# Patient Record
Sex: Male | Born: 1985
Health system: Southern US, Community
[De-identification: ages and names within clinical notes are randomized; demographics above are authoritative.]

## PROBLEM LIST (undated history)

## (undated) DIAGNOSIS — S62509A Fracture of unspecified phalanx of unspecified thumb, initial encounter for closed fracture: Secondary | ICD-10-CM

## (undated) DIAGNOSIS — R768 Other specified abnormal immunological findings in serum: Secondary | ICD-10-CM

## (undated) DIAGNOSIS — F319 Bipolar disorder, unspecified: Secondary | ICD-10-CM

## (undated) DIAGNOSIS — R7689 Other specified abnormal immunological findings in serum: Secondary | ICD-10-CM

## (undated) DIAGNOSIS — Z5900 Homelessness unspecified: Secondary | ICD-10-CM

## (undated) DIAGNOSIS — F1910 Other psychoactive substance abuse, uncomplicated: Secondary | ICD-10-CM

## (undated) DIAGNOSIS — Z22322 Carrier or suspected carrier of Methicillin resistant Staphylococcus aureus: Secondary | ICD-10-CM

## (undated) DIAGNOSIS — M479 Spondylosis, unspecified: Secondary | ICD-10-CM

## (undated) DIAGNOSIS — Z789 Other specified health status: Secondary | ICD-10-CM

## (undated) DIAGNOSIS — B1920 Unspecified viral hepatitis C without hepatic coma: Secondary | ICD-10-CM

## (undated) DIAGNOSIS — L309 Dermatitis, unspecified: Secondary | ICD-10-CM

## (undated) HISTORY — PX: OTHER SURGICAL HISTORY: SHX169

## (undated) HISTORY — PX: NO PAST SURGERIES: SHX2092

## (undated) HISTORY — DX: Other specified health status: Z78.9

## (undated) HISTORY — DX: Fracture of unspecified phalanx of unspecified thumb, initial encounter for closed fracture: S62.509A

## (undated) HISTORY — DX: Spondylosis, unspecified: M47.9

---

## 2011-10-15 ENCOUNTER — Encounter (HOSPITAL_COMMUNITY): Payer: Self-pay | Admitting: *Deleted

## 2011-10-15 ENCOUNTER — Emergency Department (HOSPITAL_COMMUNITY)
Admission: EM | Admit: 2011-10-15 | Discharge: 2011-10-15 | Disposition: A | Discharge status: Home / Self Care | Payer: Self-pay | Attending: Emergency Medicine | Admitting: Emergency Medicine

## 2011-10-15 DIAGNOSIS — F17200 Nicotine dependence, unspecified, uncomplicated: Secondary | ICD-10-CM | POA: Insufficient documentation

## 2011-10-15 DIAGNOSIS — L259 Unspecified contact dermatitis, unspecified cause: Secondary | ICD-10-CM | POA: Insufficient documentation

## 2011-10-15 DIAGNOSIS — L298 Other pruritus: Secondary | ICD-10-CM | POA: Insufficient documentation

## 2011-10-15 DIAGNOSIS — L0291 Cutaneous abscess, unspecified: Secondary | ICD-10-CM | POA: Insufficient documentation

## 2011-10-15 DIAGNOSIS — L2989 Other pruritus: Secondary | ICD-10-CM | POA: Insufficient documentation

## 2011-10-15 DIAGNOSIS — L0390 Cellulitis, unspecified: Secondary | ICD-10-CM

## 2011-10-15 DIAGNOSIS — R21 Rash and other nonspecific skin eruption: Secondary | ICD-10-CM | POA: Insufficient documentation

## 2011-10-15 MED ORDER — CEPHALEXIN 500 MG PO CAPS: 500.0000 mg | ORAL_CAPSULE | Freq: Four times a day (QID) | ORAL | Status: AC

## 2011-10-15 MED ORDER — SULFAMETHOXAZOLE-TMP DS 800-160 MG PO TABS: 2.0000 | ORAL_TABLET | Freq: Two times a day (BID) | ORAL | Status: AC

## 2011-10-15 MED ORDER — OXYCODONE-ACETAMINOPHEN 5-325 MG PO TABS: 2.0000 | ORAL_TABLET | Freq: Four times a day (QID) | ORAL | Status: AC | PRN

## 2011-10-15 MED ORDER — CEPHALEXIN 250 MG PO CAPS
500.0000 mg | ORAL_CAPSULE | Freq: Once | ORAL | Status: AC
  Administered 2011-10-15: 500 mg via ORAL
  Filled 2011-10-15: qty 2

## 2011-10-15 MED ORDER — SULFAMETHOXAZOLE-TMP DS 800-160 MG PO TABS
2.0000 | ORAL_TABLET | Freq: Once | ORAL | Status: AC
  Administered 2011-10-15: 2 via ORAL
  Filled 2011-10-15: qty 2

## 2011-10-15 MED ORDER — OXYCODONE-ACETAMINOPHEN 5-325 MG PO TABS
2.0000 | ORAL_TABLET | Freq: Once | ORAL | Status: AC
  Administered 2011-10-15: 2 via ORAL
  Filled 2011-10-15: qty 2

## 2011-10-15 NOTE — ED Provider Notes (Signed)
History     CSN: [id number]  Arrival date & time 10/15/11  1228   First MD Initiated Contact with Patient 10/15/11 1340      Chief Complaint  Patient presents with  . Rash    (Consider location/radiation/quality/duration/timing/severity/associated sxs/prior treatment) HPI Patient is a 26 year old male with history of eczema. He comes in today stating that his "eczema is acting up".  He shouldn't denies any fevers, nausea, or vomiting. He has not had any history of diabetes. He has not tried anything for this. He describes the pain that he has in his forearms and over his lower abdomen as an 8/10 and itching and burning. Patient seemed dynamically stable. He has no history of skin infections that he knows of. He does feel like this looks slightly different than his previous eczema. There is nothing that has made his symptoms better or worse.There are no other associated or modifying factors.  History reviewed. No pertinent past medical history.  History reviewed. No pertinent past surgical history.  History reviewed. No pertinent family history.  History  Substance Use Topics  . Smoking status: Current Everyday Smoker    Types: Cigarettes  . Smokeless tobacco: Not on file  . Alcohol Use: No      Review of Systems  Constitutional: Negative.   HENT: Negative.   Eyes: Negative.   Respiratory: Negative.   Cardiovascular: Negative.   Gastrointestinal: Negative.   Genitourinary: Negative.   Musculoskeletal: Negative.   Skin: Positive for pallor.  Neurological: Negative.   Hematological: Negative.   Psychiatric/Behavioral: Negative.   All other systems reviewed and are negative.    Allergies  Review of patient's allergies indicates no known allergies.  Home Medications   Current Outpatient Rx  Name Route Sig Dispense Refill  . DIPHENHYDRAMINE HCL 25 MG PO TABS Oral Take 25 mg by mouth every 6 (six) hours as needed. For itching      BP 154/85  Pulse 60  Temp(Src)  98.2 F (36.8 C) (Oral)  Resp 16  SpO2 99%  Physical Exam  Nursing note and vitals reviewed. GEN: Well-developed, well-nourished male in no distress HEENT: Atraumatic, normocephalic. Oropharynx clear without erythema EYES: PERRLA BL, no scleral icterus. NECK: Trachea midline, no meningismus CV: regular rate and rhythm. No murmurs, rubs, or gallops PULM: No respiratory distress.  No crackles, wheezes, or rales. GI: soft, non-tender. No guarding, rebound, or tenderness. + bowel sounds  Neuro: cranial nerves 2-12 intact, no abnormalities of strength or sensation, A and O x 3 MSK: Patient moves all 4 extremities symmetrically, no deformity, edema, or injury noted Psych: no abnormality of mood Skin: Patient with rash with appearance of eczema and raised plaques over the right forearm. There are open areas in this did appear to be from excoriation with surrounding erythema. Over the lower abdomen and also the left arm there are erythematous areas. These appear to be associated with hair follicles and consistent with a cellulitis beginning as a folliculitis the  ED Course  Procedures (including critical care time)  Labs Reviewed - No data to display No results found.   1. Cellulitis       MDM  Patient was evaluated by myself and was him dynamically stable. He is on any history of diabetes. Based on his symptoms he was treated with oral pain medication and also with Keflex and Bactrim. I prescribed him with 10 days of both of these antibiotics as well as 30 tablets of Percocet. He was told to use ice  to avoid scratching. Patient was comfortable with plan for discharge home and was discharged in good condition.        Cyndra Numbers, MD 10/15/11 [phone]

## 2011-10-15 NOTE — ED Notes (Signed)
Pt reprots having exzema x 10 years, has red itching rash to abd and bilateral arms.

## 2011-10-15 NOTE — ED Notes (Signed)
Pt gowned.  Dr. Alto Denver at pt bedside.

## 2011-10-15 NOTE — ED Notes (Signed)
Pt. Stated, I've had a rash for 8 years.

## 2012-04-04 ENCOUNTER — Emergency Department (HOSPITAL_COMMUNITY)
Admission: EM | Admit: 2012-04-04 | Discharge: 2012-04-04 | Disposition: A | Discharge status: Home / Self Care | Payer: Self-pay | Attending: Emergency Medicine | Admitting: Emergency Medicine

## 2012-04-04 ENCOUNTER — Encounter (HOSPITAL_COMMUNITY): Payer: Self-pay | Admitting: *Deleted

## 2012-04-04 DIAGNOSIS — F17200 Nicotine dependence, unspecified, uncomplicated: Secondary | ICD-10-CM | POA: Insufficient documentation

## 2012-04-04 DIAGNOSIS — IMO0002 Reserved for concepts with insufficient information to code with codable children: Secondary | ICD-10-CM

## 2012-04-04 DIAGNOSIS — S61209A Unspecified open wound of unspecified finger without damage to nail, initial encounter: Secondary | ICD-10-CM | POA: Insufficient documentation

## 2012-04-04 DIAGNOSIS — W268XXA Contact with other sharp object(s), not elsewhere classified, initial encounter: Secondary | ICD-10-CM | POA: Insufficient documentation

## 2012-04-04 MED ORDER — IBUPROFEN 400 MG PO TABS
800.0000 mg | ORAL_TABLET | Freq: Once | ORAL | Status: AC
  Administered 2012-04-04: 800 mg via ORAL
  Filled 2012-04-04: qty 2

## 2012-04-04 MED ORDER — AMOXICILLIN-POT CLAVULANATE 875-125 MG PO TABS: 1.0000 | ORAL_TABLET | Freq: Two times a day (BID) | ORAL | Status: AC

## 2012-04-04 NOTE — ED Notes (Signed)
Patient states recently received tetanus injection.  Wound cleaned with wound cleanser open to air for Doctor to examine pain 3-8//10 achy burning. Radial pulses +2 bilateral full sensation.

## 2012-04-04 NOTE — ED Provider Notes (Signed)
History    This chart was scribed for James Baker, MD, MD by Smitty Pluck. The patient was seen in room TR05C and the patient's care was started at 12:14PM.   CSN: [id number]  Arrival date & time 04/04/12  1152   None     Chief Complaint  Patient presents with  . Extremity Laceration    (Consider location/radiation/quality/duration/timing/severity/associated sxs/prior treatment) The history is provided by the patient.   Lynard Postlewait is a 26 y.o. male who presents to the Emergency Department complaining of moderate left thumb laceration onset 17 hours ago. Pt reports that he cut his thumb on scrap metal in the woods. Pt reports that he has cleaned the area with soap. No active bleeding.    No past medical history on file.  No past surgical history on file.  No family history on file.  History  Substance Use Topics  . Smoking status: Current Everyday Smoker    Types: Cigarettes  . Smokeless tobacco: Not on file  . Alcohol Use: No      Review of Systems  Constitutional: Negative for fever and chills.  Respiratory: Negative for shortness of breath.   Gastrointestinal: Negative for nausea and vomiting.  Neurological: Negative for weakness.  All other systems reviewed and are negative.    Allergies  Review of patient's allergies indicates no known allergies.  Home Medications   Current Outpatient Rx  Name Route Sig Dispense Refill  . DIPHENHYDRAMINE HCL 25 MG PO TABS Oral Take 25 mg by mouth every 6 (six) hours as needed. For itching      BP 155/90  Pulse 68  Temp 98 F (36.7 C) (Oral)  Resp 16  SpO2 99%  Physical Exam  Nursing note and vitals reviewed. Constitutional: He is oriented to person, place, and time. He appears well-developed and well-nourished. No distress.  HENT:  Head: Normocephalic and atraumatic.  Eyes: Conjunctivae are normal.  Neck: Normal range of motion. Neck supple.  Pulmonary/Chest: Effort normal. No respiratory distress.    Neurological: He is alert and oriented to person, place, and time.  Skin: Skin is warm and dry.       Base of left thumb palmar surface 1cm without active Bleeding slight erythema around the wound at base of left thumb Neurovascular intact at left thumb   Psychiatric: He has a normal mood and affect. His behavior is normal.    ED Course  Procedures (including critical care time) DIAGNOSTIC STUDIES: Oxygen Saturation is 99% on room air, normal by my interpretation.    COORDINATION OF CARE: 12:20PM EDP discusses pt ED treatment with pt     Labs Reviewed - No data to display No results found.   No diagnosis found.    MDM  Pt given abx and wound care instructions   I personally performed the services described in this documentation, which was scribed in my presence. The recorded information has been reviewed and considered.       James Baker, MD 04/04/12 1226

## 2012-04-04 NOTE — ED Notes (Signed)
Lt thumb laceration r/t getting scrap metal out of woods. Bleeding controlled. Sm. Laceration.

## 2012-04-04 NOTE — ED Notes (Signed)
Wound care placed bacitracin and wounds and bandaged patient tolerated procedure without incident.

## 2012-08-13 ENCOUNTER — Emergency Department (HOSPITAL_COMMUNITY)
Admission: EM | Admit: 2012-08-13 | Discharge: 2012-08-13 | Disposition: A | Discharge status: Home / Self Care | Payer: Self-pay | Attending: Emergency Medicine | Admitting: Emergency Medicine

## 2012-08-13 ENCOUNTER — Encounter (HOSPITAL_COMMUNITY): Payer: Self-pay | Admitting: Emergency Medicine

## 2012-08-13 ENCOUNTER — Emergency Department (HOSPITAL_COMMUNITY): Payer: Self-pay

## 2012-08-13 DIAGNOSIS — K0889 Other specified disorders of teeth and supporting structures: Secondary | ICD-10-CM

## 2012-08-13 DIAGNOSIS — Y9389 Activity, other specified: Secondary | ICD-10-CM | POA: Insufficient documentation

## 2012-08-13 DIAGNOSIS — S025XXA Fracture of tooth (traumatic), initial encounter for closed fracture: Secondary | ICD-10-CM | POA: Insufficient documentation

## 2012-08-13 DIAGNOSIS — Y929 Unspecified place or not applicable: Secondary | ICD-10-CM | POA: Insufficient documentation

## 2012-08-13 DIAGNOSIS — X58XXXA Exposure to other specified factors, initial encounter: Secondary | ICD-10-CM | POA: Insufficient documentation

## 2012-08-13 DIAGNOSIS — F17200 Nicotine dependence, unspecified, uncomplicated: Secondary | ICD-10-CM | POA: Insufficient documentation

## 2012-08-13 DIAGNOSIS — M25539 Pain in unspecified wrist: Secondary | ICD-10-CM | POA: Insufficient documentation

## 2012-08-13 MED ORDER — IBUPROFEN 400 MG PO TABS
800.0000 mg | ORAL_TABLET | Freq: Once | ORAL | Status: AC
  Administered 2012-08-13: 800 mg via ORAL
  Filled 2012-08-13: qty 2

## 2012-08-13 MED ORDER — IBUPROFEN 800 MG PO TABS: 800.0000 mg | ORAL_TABLET | Freq: Three times a day (TID) | ORAL | Status: DC

## 2012-08-13 MED ORDER — HYDROCODONE-ACETAMINOPHEN 5-325 MG PO TABS: 1.0000 | ORAL_TABLET | Freq: Four times a day (QID) | ORAL | Status: DC | PRN

## 2012-08-13 NOTE — ED Notes (Signed)
Patient transported to X-ray 

## 2012-08-13 NOTE — ED Notes (Signed)
Returned from xray

## 2012-08-13 NOTE — ED Notes (Signed)
C/O TOOTH PAIN AFTER TOOTH BROKE THIS AM. STATES HE BIT DOWN ON HIS TONGUE RING. ALSO, C/O LEFT WRIST PAIN SINCE LAST WEEK. 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

## 2012-08-13 NOTE — ED Provider Notes (Signed)
History   This chart was scribed for James Skene, MD by James Beard, ED Scribe. This patient was seen in room TR05C/TR05C and the patient's care was started at 8:55AM.   CSN: [id number]  Arrival date & time 08/13/12  0831   None     Chief Complaint  Patient presents with  . Dental Pain     The history is provided by the patient. No language interpreter was used.    James Beard is a 26 y.o. male who presents to the Emergency Department complaining of new dental pain after his tooth broke while biting down on his tongue ring 5 hours ago upon waking up. Pt reports taking a baby aspirin with no relief, states his pain is moderate to severe. Pt also complains of left wrist pain starting 1 week ago after wrestling with a friend and hearing it "pop".    Pt is a current everyday smoker but denies alcohol use. No past medical history on file.  No past surgical history on file.  No family history on file.  History  Substance Use Topics  . Smoking status: Current Every Day Smoker    Types: Cigarettes  . Smokeless tobacco: Not on file  . Alcohol Use: No      Review of Systems At least 10pt or greater review of systems completed and are negative except where specified in the HPI.  Allergies  Review of patient's allergies indicates no known allergies.  Home Medications  No current outpatient prescriptions on file.  BP 132/75  Pulse 61  Temp 98 F (36.7 C) (Oral)  Resp 16  SpO2 100%  Physical Exam  Nursing notes reviewed.  Electronic medical record reviewed. VITAL SIGNS:   Filed Vitals:   08/13/12 0844  BP: 132/75  Pulse: 61  Temp: 98 F (36.7 C)  TempSrc: Oral  Resp: 16  SpO2: 100%   CONSTITUTIONAL: Awake, oriented, appears non-toxic HENT: Atraumatic, normocephalic, oral mucosa pink and moist, airway patent. Poor dentition. Patient's teeth are almost universally carious. Patient has significant erosion at all maxillary teeth at gumline. Patient has  fractures of 11 and 12 - fracture of 11 is acute. Patient has gingival disease. Nares patent without drainage. External ears normal. Tongue ring in place. EYES: Conjunctiva clear, EOMI, PERRLA NECK: Trachea midline, non-tender, supple CARDIOVASCULAR: Normal heart rate, Normal rhythm, No murmurs, rubs, gallops PULMONARY/CHEST: Clear to auscultation, no rhonchi, wheezes, or rales. Symmetrical breath sounds. Non-tender. ABDOMINAL: Non-distended, soft, non-tender - no rebound or guarding.  BS normal. NEUROLOGIC: Non-focal, moving all four extremities, no gross sensory or motor deficits. EXTREMITIES: No clubbing, cyanosis, or edema. Patient has tenderness to palpation over the scaphoid bone in the anatomic snuffbox. Pulses are intact, sensations intact in radial, ulnar and median nerve distributions. SKIN: Warm, Dry, No erythema, No rash  ED Course  Procedures (including critical care time)  DIAGNOSTIC STUDIES: Oxygen Saturation is 100% on room air, normal by my interpretation.    COORDINATION OF CARE:  9:02 AM Discussed treatment plan which includes xray for wrist and pain treatment for dental pain with pt at bedside and pt agreed to plan.   Labs Reviewed - No data to display No results found.   1. Tooth fracture       MDM  James Beard is a 26 y.o. male she presents with fractured tooth-fracture portion is gone. Patient will need further dental work for definitive care. Patient agrees to dental block. Tear superior alveolar nerve block and middle superior alveolar nerve  block performed. Patient also has hand pain on review of systems - tenderness over the scaphoid bone on physical exam, obtaining x-rays.  X-rays as interpreted by radiology shows no acute abnormality. Scaphoid appears intact on my review - no cortical disruption.  Will be referred to his primary care physician if pain persists.  Patient referred to [facility] dental clinic - pain medicine given. No indications for  antibiotics at this time.   I personally performed the services described in this documentation, which was scribed in my presence. The recorded information has been reviewed and is accurate. James Beard, M.D.      James Skene, MD 08/13/12 [phone]

## 2013-05-01 ENCOUNTER — Emergency Department (INDEPENDENT_AMBULATORY_CARE_PROVIDER_SITE_OTHER): Payer: Self-pay

## 2013-05-01 ENCOUNTER — Encounter (HOSPITAL_COMMUNITY): Payer: Self-pay | Admitting: Emergency Medicine

## 2013-05-01 ENCOUNTER — Emergency Department (INDEPENDENT_AMBULATORY_CARE_PROVIDER_SITE_OTHER)
Admission: EM | Admit: 2013-05-01 | Discharge: 2013-05-01 | Disposition: A | Discharge status: Home / Self Care | Payer: Self-pay | Source: Home / Self Care | Attending: Emergency Medicine | Admitting: Emergency Medicine

## 2013-05-01 DIAGNOSIS — IMO0002 Reserved for concepts with insufficient information to code with codable children: Secondary | ICD-10-CM

## 2013-05-01 DIAGNOSIS — S8391XA Sprain of unspecified site of right knee, initial encounter: Secondary | ICD-10-CM

## 2013-05-01 DIAGNOSIS — M25469 Effusion, unspecified knee: Secondary | ICD-10-CM

## 2013-05-01 DIAGNOSIS — M25461 Effusion, right knee: Secondary | ICD-10-CM

## 2013-05-01 MED ORDER — HYDROCODONE-ACETAMINOPHEN 5-325 MG PO TABS
ORAL_TABLET | ORAL | Status: AC
  Filled 2013-05-01: qty 2

## 2013-05-01 MED ORDER — MELOXICAM 7.5 MG PO TABS: 7.5000 mg | ORAL_TABLET | Freq: Every day | ORAL | Status: DC

## 2013-05-01 MED ORDER — HYDROCODONE-ACETAMINOPHEN 5-325 MG PO TABS
2.0000 | ORAL_TABLET | Freq: Once | ORAL | Status: AC
  Administered 2013-05-01: 2 via ORAL

## 2013-05-01 MED ORDER — TRAMADOL HCL 50 MG PO TABS: 50.0000 mg | ORAL_TABLET | Freq: Four times a day (QID) | ORAL | Status: DC | PRN

## 2013-05-01 NOTE — ED Notes (Signed)
Triage delayed secondary to department acuity

## 2013-05-01 NOTE — ED Notes (Signed)
Confirmed patient has a driver prior to medication administration

## 2013-05-01 NOTE — ED Provider Notes (Signed)
CSN: [id number]     Arrival date & time 05/01/13  1724 History   First MD Initiated Contact with Patient 05/01/13 1757     Chief Complaint  Patient presents with  . Knee Pain   (Consider location/radiation/quality/duration/timing/severity/associated sxs/prior Treatment) HPI Comments: Patient presents urgent care complaining of severe to moderate right knee pain. Patient describes that he was playing basketball yesterday, at one point he jumped and landed after twisting his knee and the awkward position. He is been having pain since then it is exacerbated with minimal flexion extension and weight-bearing activities. Patient denies having had any previous injuries to his right knee, denies any on stability or unsteady knee but did hear a pop when he fell. He is been having pain since then. Patient denies any weakness or paresthesias such as tingling or numbness sensation to his right lower extremity. He has taken ibuprofen for pain over-the-counter.  Patient is a 27 y.o. male presenting with knee pain. The history is provided by the patient.  Knee Pain Location:  Knee Injury: yes   Mechanism of injury: fall   Fall:    Fall occurred: While playing basketball.   Impact surface:  Hard floor Knee location:  R knee Pain details:    Quality:  Aching, pressure and sharp   Radiates to:  Does not radiate   Severity:  Moderate   Onset quality:  Sudden Chronicity:  New Dislocation: no   Prior injury to area:  No Relieved by:  Nothing Worsened by:  Flexion, extension, activity and exercise Ineffective treatments:  NSAIDs, ice and elevation Associated symptoms: decreased ROM and swelling   Associated symptoms: no back pain, no stiffness and no tingling   Risk factors: no frequent fractures, no known bone disorder and no obesity     History reviewed. No pertinent past medical history. History reviewed. No pertinent past surgical history. History reviewed. No pertinent family history. History    Substance Use Topics  . Smoking status: Current Every Day Smoker    Types: Cigarettes  . Smokeless tobacco: Not on file  . Alcohol Use: Yes    Review of Systems  Constitutional: Negative for activity change and appetite change.  Musculoskeletal: Positive for joint swelling. Negative for myalgias, back pain, arthralgias and stiffness.  Skin: Negative for color change, pallor, rash and wound.  Neurological: Negative for weakness and headaches.    Allergies  Review of patient's allergies indicates no known allergies.  Home Medications   Current Outpatient Rx  Name  Route  Sig  Dispense  Refill  . meloxicam (MOBIC) 7.5 MG tablet   Oral   Take 1 tablet (7.5 mg total) by mouth daily.   14 tablet   0   . traMADol (ULTRAM) 50 MG tablet   Oral   Take 1 tablet (50 mg total) by mouth every 6 (six) hours as needed for pain.   15 tablet   0    BP 128/80  Pulse 80  Temp(Src) 98.1 F (36.7 C) (Oral)  Resp 20  SpO2 97% Physical Exam  Nursing note and vitals reviewed. Constitutional: He appears well-developed and well-nourished. No distress.  Musculoskeletal: He exhibits tenderness.       Right knee: He exhibits decreased range of motion, swelling and bony tenderness. He exhibits no effusion, no ecchymosis, no deformity, no laceration, no erythema, normal alignment, no LCL laxity, normal patellar mobility, normal meniscus and no MCL laxity. Tenderness found. Medial joint line and lateral joint line tenderness noted. No patellar  tendon tenderness noted.       Legs: Neurological: He is alert.  Skin: No rash noted. No erythema. No pallor.    ED Course  Procedures (including critical care time) Labs Review Labs Reviewed - No data to display Imaging Review Dg Knee Complete 4 Views Right  05/01/2013   *RADIOLOGY REPORT*  Clinical Data: Traumatic injury with pain  RIGHT KNEE - COMPLETE 4+ VIEW  Comparison: None.  Findings: No acute fracture or dislocation is noted.  A small joint  effusion is seen.  IMPRESSION: Joint fluid without acute bony abnormality.   Original Report Authenticated By: Alcide Clever, M.D.    MDM  Minimal knee effusion- status post fall  Will place patient on a immobilizer and crutches and instructed to followup with an orthopedic Dr. 5-7 days from now. Patient had been prescribed both meloxicam and Ultram for pain management.  Written information was given to patient for orthopedic Dr. on call. At this point patient patient's be action to exam cannot fully rule out an intra-articular associated injury. Doubt patient has a anterior cruciate ligament or collateral ligament tear.  Discharge Medication List as of 05/01/2013  6:58 PM    START taking these medications   Details  meloxicam (MOBIC) 7.5 MG tablet Take 1 tablet (7.5 mg total) by mouth daily., Starting 05/01/2013, Until Discontinued, Print    traMADol (ULTRAM) 50 MG tablet Take 1 tablet (50 mg total) by mouth every 6 (six) hours as needed for pain., Starting 05/01/2013, Until Discontinued, Normal        Jimmie Molly, MD 05/01/13 2059

## 2013-05-01 NOTE — ED Notes (Signed)
Patient seen by dr Ladon Applebaum prior to this nurse.  Pain in right knee, injured yesterday while playing basketball.  Jumped up and when he landed felt pain and fell to the ground.

## 2014-01-15 ENCOUNTER — Encounter (HOSPITAL_COMMUNITY): Payer: Self-pay | Admitting: Emergency Medicine

## 2014-01-15 ENCOUNTER — Emergency Department (HOSPITAL_COMMUNITY)
Admission: EM | Admit: 2014-01-15 | Discharge: 2014-01-15 | Disposition: A | Discharge status: Home / Self Care | Payer: Self-pay | Attending: Emergency Medicine | Admitting: Emergency Medicine

## 2014-01-15 DIAGNOSIS — Z791 Long term (current) use of non-steroidal anti-inflammatories (NSAID): Secondary | ICD-10-CM | POA: Insufficient documentation

## 2014-01-15 DIAGNOSIS — F17200 Nicotine dependence, unspecified, uncomplicated: Secondary | ICD-10-CM | POA: Insufficient documentation

## 2014-01-15 DIAGNOSIS — K029 Dental caries, unspecified: Secondary | ICD-10-CM | POA: Insufficient documentation

## 2014-01-15 DIAGNOSIS — K089 Disorder of teeth and supporting structures, unspecified: Secondary | ICD-10-CM | POA: Insufficient documentation

## 2014-01-15 MED ORDER — OXYCODONE-ACETAMINOPHEN 5-325 MG PO TABS
1.0000 | ORAL_TABLET | Freq: Once | ORAL | Status: AC
  Administered 2014-01-15: 1 via ORAL
  Filled 2014-01-15: qty 1

## 2014-01-15 MED ORDER — NAPROXEN 500 MG PO TABS: 500.0000 mg | ORAL_TABLET | Freq: Two times a day (BID) | ORAL | Status: DC

## 2014-01-15 MED ORDER — OXYCODONE-ACETAMINOPHEN 5-325 MG PO TABS: 1.0000 | ORAL_TABLET | Freq: Four times a day (QID) | ORAL | Status: DC | PRN

## 2014-01-15 MED ORDER — PENICILLIN V POTASSIUM 500 MG PO TABS: 500.0000 mg | ORAL_TABLET | Freq: Four times a day (QID) | ORAL | Status: DC

## 2014-01-15 NOTE — ED Notes (Signed)
Pt c/o dental pain and facial swelling x 4 days.

## 2014-01-15 NOTE — Discharge Instructions (Signed)
Dental Caries Dental caries is tooth decay. This decay can cause a hole in teeth (cavity) that can get bigger and deeper over time. HOME CARE  Brush and floss your teeth. Do this at least two times a day.  Use a fluoride toothpaste.  Use a mouth rinse if told by your dentist or doctor.  Eat less sugary and starchy foods. Drink less sugary drinks.  Avoid snacking often on sugary and starchy foods. Avoid sipping often on sugary drinks.  Keep regular checkups and cleanings with your dentist.  Use fluoride supplements if told by your dentist or doctor.  Allow fluoride to be applied to teeth if told by your dentist or doctor. MAKE SURE YOU:  Understand these instructions.  Will watch your condition.  Will get help right away if you are not doing well or get worse. Document Released: 05/31/2008 Document Revised: 04/24/2013 Document Reviewed: 08/24/2012 [facility] Patient Information 2014 [place], [place].  Dental Care and Dentist Visits Dental care supports good overall health. Regular dental visits can also help you avoid dental pain, bleeding, infection, and other more serious health problems in the future. It is important to keep the mouth healthy because diseases in the teeth, gums, and other oral tissues can spread to other areas of the body. Some problems, such as diabetes, heart disease, and pre-term labor have been associated with poor oral health.  See your dentist every 6 months. If you experience emergency problems such as a toothache or broken tooth, go to the dentist right away. If you see your dentist regularly, you may catch problems early. It is easier to be treated for problems in the early stages.  WHAT TO EXPECT AT A DENTIST VISIT  Your dentist will look for many common oral health problems and recommend proper treatment. At your regular dental visit, you can expect:  Gentle cleaning of the teeth and gums. This includes scraping and polishing. This helps to remove the  sticky substance around the teeth and gums (plaque). Plaque forms in the mouth shortly after eating. Over time, plaque hardens on the teeth as tartar. If tartar is not removed regularly, it can cause problems. Cleaning also helps remove stains.  Periodic X-rays. These pictures of the teeth and supporting bone will help your dentist assess the health of your teeth.  Periodic fluoride treatments. Fluoride is a natural mineral shown to help strengthen teeth. Fluoride treatmentinvolves applying a fluoride gel or varnish to the teeth. It is most commonly done in children.  Examination of the mouth, tongue, jaws, teeth, and gums to look for any oral health problems, such as:  Cavities (dental caries). This is decay on the tooth caused by plaque, sugar, and acid in the mouth. It is best to catch a cavity when it is small.  Inflammation of the gums caused by plaque buildup (gingivitis).  Problems with the mouth or malformed or misaligned teeth.  Oral cancer or other diseases of the soft tissues or jaws. KEEP YOUR TEETH AND GUMS HEALTHY For healthy teeth and gums, follow these general guidelines as well as your dentist's specific advice:  Have your teeth professionally cleaned at the dentist every 6 months.  Brush twice daily with a fluoride toothpaste.  Floss your teeth daily.  Ask your dentist if you need fluoride supplements, treatments, or fluoride toothpaste.  Eat a healthy diet. Reduce foods and drinks with added sugar.  Avoid smoking. TREATMENT FOR ORAL HEALTH PROBLEMS If you have oral health problems, treatment varies depending on the conditions  present in your teeth and gums. °· Your caregiver will most likely recommend good oral hygiene at each visit. °· For cavities, gingivitis, or other oral health disease, your caregiver will perform a procedure to treat the problem. This is typically done at a separate appointment. Sometimes your caregiver will refer you to another dental  specialist for specific tooth problems or for surgery. °SEEK IMMEDIATE DENTAL CARE IF: °· You have pain, bleeding, or soreness in the gum, tooth, jaw, or mouth area. °· A permanent tooth becomes loose or separated from the gum socket. °· You experience a blow or injury to the mouth or jaw area. °Document Released: 05/04/2011 Document Revised: 11/14/2011 Document Reviewed: 05/04/2011 °[employer]® Patient Information ©2014 [employer]. ° °

## 2014-01-15 NOTE — ED Provider Notes (Signed)
CSN: [id number]     Arrival date & time 01/15/14  1647 History  This chart was scribed for non-physician practitioner, James FinnerErin O'Malley, PA-C working with Celene KrasJon R Knapp, MD by Luisa DagoPriscilla Tutu, ED scribe. This patient was seen in room WTR9/WTR9 and the patient's care was started at 5:17 PM.    Chief Complaint  Patient presents with  . Dental Pain  . Facial Swelling   The history is provided by the patient. No language interpreter was used.   HPI Comments: James Beard is a [age] y.o. male who presents to the Emergency Department complaining of a dental abscess that started approximately 3 days ago. Pain is constant, aching, throbbing, 9/10.  Pt reports taking Percocet that was prescribed to his neighbor.  He states that he had relief with them, but after a while the pain would return. He denies any nausea, fever, or emesis. He denies any medicinal allergies.   Pt is a smoker.  History reviewed. No pertinent past medical history. No past surgical history on file. No family history on file. History  Substance Use Topics  . Smoking status: Current Every Day Smoker    Types: Cigarettes  . Smokeless tobacco: Not on file  . Alcohol Use: Yes    Review of Systems  HENT: Positive for dental problem.   All other systems reviewed and are negative.  Allergies  Review of patient's allergies indicates no known allergies.  Home Medications   Prior to Admission medications   Medication Sig Start Date End Date Taking? Authorizing Provider  meloxicam (MOBIC) 7.5 MG tablet Take 1 tablet (7.5 mg total) by mouth daily. 05/01/13   Jimmie MollyPaolo Coll, MD  traMADol (ULTRAM) 50 MG tablet Take 1 tablet (50 mg total) by mouth every 6 (six) hours as needed for pain. 05/01/13   Jimmie MollyPaolo Coll, MD   Triage Vitals:BP 167/77  Pulse 91  Temp(Src) 98.7 F (37.1 C) (Oral)  Resp 18  SpO2 100%  Physical Exam  Nursing note and vitals reviewed. Constitutional: He is oriented to person, place, and time. He appears  well-developed and well-nourished.  HENT:  Head: Normocephalic and atraumatic.  Diffused dental decay. Front right incisor decayed down to gingiva. Tender to palpation without gingival abscess.   Eyes: EOM are normal.  Neck: Normal range of motion.  Cardiovascular: Normal rate.   Pulmonary/Chest: Effort normal.  Musculoskeletal: Normal range of motion.  Neurological: He is alert and oriented to person, place, and time.  Skin: Skin is warm and dry.  Psychiatric: He has a normal mood and affect. His behavior is normal.    ED Course  Procedures (including critical care time)  DIAGNOSTIC STUDIES: Oxygen Saturation is 100% on RA, normal by my interpretation.    COORDINATION OF CARE: 5:19 PM- Will prescribe antibiotics and pain medication. Will also give pt a referral to on-call dentist. Pt advised of plan for treatment and pt agrees.  MDM   Final diagnoses:  Dental decay  Pain due to dental caries  Current smoker    Pt presenting with dental pain, diffuse dental decay present. No evidence of gingival abscess requiring I&D at this time.  Will tx with PCN and pain medication-percocet. Advised to called Dr. Lucky CowboyKnox, DDS, for further evaluation and treatment of dental pain. Return precautions provided. Pt verbalized understanding and agreement with tx plan.   I personally performed the services described in this documentation, which was scribed in my presence. The recorded information has been reviewed and is accurate.  James Finnerrin O'Malley, PA-C 01/15/14 [phone] [id number]

## 2014-01-16 NOTE — ED Provider Notes (Signed)
Medical screening examination/treatment/procedure(s) were performed by non-physician practitioner and as supervising physician I was immediately available for consultation/collaboration.    Jennipher Weatherholtz R Eldo Umanzor, MD 01/16/14 0022 

## 2014-08-26 ENCOUNTER — Emergency Department: Payer: Self-pay | Admitting: Emergency Medicine

## 2015-09-21 ENCOUNTER — Emergency Department (HOSPITAL_COMMUNITY)
Admission: EM | Admit: 2015-09-21 | Discharge: 2015-09-21 | Disposition: A | Discharge status: Home / Self Care | Payer: Self-pay

## 2015-09-22 ENCOUNTER — Encounter (HOSPITAL_COMMUNITY): Payer: Self-pay | Admitting: Emergency Medicine

## 2015-09-22 ENCOUNTER — Emergency Department (HOSPITAL_COMMUNITY)
Admission: EM | Admit: 2015-09-22 | Discharge: 2015-09-22 | Disposition: A | Discharge status: Home / Self Care | Payer: Self-pay | Attending: Emergency Medicine | Admitting: Emergency Medicine

## 2015-09-22 DIAGNOSIS — K047 Periapical abscess without sinus: Secondary | ICD-10-CM

## 2015-09-22 DIAGNOSIS — K029 Dental caries, unspecified: Secondary | ICD-10-CM | POA: Insufficient documentation

## 2015-09-22 DIAGNOSIS — F17210 Nicotine dependence, cigarettes, uncomplicated: Secondary | ICD-10-CM | POA: Insufficient documentation

## 2015-09-22 DIAGNOSIS — Z720 Tobacco use: Secondary | ICD-10-CM

## 2015-09-22 DIAGNOSIS — R6883 Chills (without fever): Secondary | ICD-10-CM | POA: Insufficient documentation

## 2015-09-22 DIAGNOSIS — Z792 Long term (current) use of antibiotics: Secondary | ICD-10-CM | POA: Insufficient documentation

## 2015-09-22 DIAGNOSIS — K002 Abnormalities of size and form of teeth: Secondary | ICD-10-CM | POA: Insufficient documentation

## 2015-09-22 DIAGNOSIS — Z791 Long term (current) use of non-steroidal anti-inflammatories (NSAID): Secondary | ICD-10-CM | POA: Insufficient documentation

## 2015-09-22 MED ORDER — DOXYCYCLINE HYCLATE 100 MG PO CAPS: 100.0000 mg | ORAL_CAPSULE | Freq: Two times a day (BID) | ORAL | Status: DC

## 2015-09-22 NOTE — ED Notes (Signed)
Pt sts left sided dental pain x 3 days

## 2015-09-22 NOTE — ED Provider Notes (Signed)
CSN: [id number]   Arrival date & time 09/22/15 1630  History  By signing my name below, I, Bethel Born, attest that this documentation has been prepared under the direction and in the presence of [employer] PA-C Electronically Signed: Bethel Born, ED Scribe. 09/22/2015. 5:56 PM. Chief Complaint  Patient presents with  . Dental Pain    HPI Patient is a 30 y.o. male presenting with tooth pain. The history is provided by the patient. No language interpreter was used.  Dental Pain Location:  Upper Upper teeth location:  14/LU 1st molar Quality:  Sharp Severity:  Severe Onset quality:  Gradual Duration:  3 days Timing:  Constant Progression:  Unchanged Chronicity:  New Context: dental caries and poor dentition   Relieved by:  Nothing Worsened by:  Cold food/drink (Cold air) Ineffective treatments:  NSAIDs and topical anesthetic gel (Percocet) Associated symptoms: facial swelling and gum swelling   Associated symptoms: no difficulty swallowing, no drooling, no fever, no headaches, no neck pain, no neck swelling, no oral bleeding, no oral lesions and no trismus   Risk factors: lack of dental care and smoking    James Beard. is a 30 y.o. male who presents to the Emergency Department complaining of constant, 8/10 in severity, non-radiating, sharp, left upper dental pain with onset 3 days ago. The pain is worse with cold air. Ibuprofen, percocet (bought off the [facility]), and Orajel have provided insufficient pain relief at home.  Associated symptoms include gum swelling, facial swelling, and chills. Pt denies fever, sweats, drainage or bleeding from the gums, difficulty swallowing, drooling, trismus, neck stiffness/swelling/pain, abdominal pain, n/v/d, constipation, dysuria, hematuria, numbness, tingling, and weakness. Pt is a smoker. NKDA. No dentist.   History reviewed. No pertinent past medical history.  History reviewed. No pertinent past surgical history.   History reviewed. No pertinent family history.  Social History  Substance Use Topics  . Smoking status: Current Every Day Smoker    Types: Cigarettes  . Smokeless tobacco: None  . Alcohol Use: Yes     Review of Systems  Constitutional: Positive for chills. Negative for fever.  HENT: Positive for dental problem and facial swelling. Negative for drooling, mouth sores and trouble swallowing.   Respiratory: Negative for shortness of breath.   Gastrointestinal: Negative for nausea, vomiting, abdominal pain and diarrhea.  Genitourinary: Negative for dysuria and hematuria.  Musculoskeletal: Negative for neck pain.  Skin: Negative for color change.  Allergic/Immunologic: Negative for immunocompromised state.  Neurological: Negative for headaches.   All other systems negative except as documented in the HPI. All pertinent positives and negatives as reviewed in the HPI. Home Medications   Prior to Admission medications   Medication Sig Start Date End Date Taking? Authorizing Provider  doxycycline (VIBRAMYCIN) 100 MG capsule Take 1 capsule (100 mg total) by mouth 2 (two) times daily. One po bid x 7 days 09/22/15   Nai Dasch Camprubi-Soms, PA-C  meloxicam (MOBIC) 7.5 MG tablet Take 1 tablet (7.5 mg total) by mouth daily. 05/01/13   Jimmie Molly, MD  naproxen (NAPROSYN) 500 MG tablet Take 1 tablet (500 mg total) by mouth 2 (two) times daily with a meal. 01/15/14   Junius Finner, PA-C  oxyCODONE-acetaminophen (PERCOCET/ROXICET) 5-325 MG per tablet Take 1-2 tablets by mouth every 6 (six) hours as needed for severe pain. 01/15/14   Junius Finner, PA-C  penicillin v potassium (VEETID) 500 MG tablet Take 1 tablet (500 mg total) by mouth 4 (four) times daily. 01/15/14   Junius Finner,  PA-C  traMADol (ULTRAM) 50 MG tablet Take 1 tablet (50 mg total) by mouth every 6 (six) hours as needed for pain. 05/01/13   Jimmie Molly, MD    Allergies  Review of patient's allergies indicates no known allergies.  Triage  Vitals: BP 137/78 mmHg  Pulse 83  Temp(Src) 98.6 F (37 C) (Oral)  Resp 18  SpO2 98%  Physical Exam  Constitutional: He is oriented to person, place, and time. Vital signs are normal. He appears well-developed and well-nourished.  Non-toxic appearance. No distress.  Afebrile, nontoxic, NAD  HENT:  Head: Normocephalic and atraumatic.  Nose: Nose normal.  Mouth/Throat: Uvula is midline, oropharynx is clear and moist and mucous membranes are normal. No trismus in the jaw. Dental caries present. No dental abscesses or uvula swelling.    Diffuse dental decay, with left upper molar #14 decayed with surrounding gingival erythema without focal abscess, area moderately TTP, no evidence of Ludwig's . Nose clear. Oropharynx clear and moist, without uvular swelling or deviation, no trismus or drooling, no tonsillar swelling or erythema, no exudates.    Eyes: Conjunctivae and EOM are normal. Right eye exhibits no discharge. Left eye exhibits no discharge.  Neck: Normal range of motion. Neck supple.  Cardiovascular: Normal rate.   Pulmonary/Chest: Effort normal. No respiratory distress.  Abdominal: Normal appearance. He exhibits no distension.  Musculoskeletal: Normal range of motion.  Neurological: He is alert and oriented to person, place, and time. He has normal strength. No sensory deficit.  Skin: Skin is warm, dry and intact. No rash noted.  Psychiatric: He has a normal mood and affect.  Nursing note and vitals reviewed.   ED Course  Procedures  DIAGNOSTIC STUDIES: Oxygen Saturation is 98% on RA,  normal by my interpretation.    COORDINATION OF CARE: 5:46 PM Discussed treatment plan which includes discharge with Vibramycin with pt at bedside and pt agreed to the plan.  MDM   Final diagnoses:  Dental decay  Infected dental caries  Pain due to dental caries  Tobacco use    30 y.o. male here with Dental pain associated with dental decay and possible dental infection with patient  afebrile, non toxic appearing and swallowing secretions well. I gave patient referral to dentist and stressed the importance of dental follow up for ultimate management of dental pain.  I have also discussed reasons to return immediately to the ER.  Patient expresses understanding and agrees with plan.  I will also give doxycycline.  Tylenol/motrin as directed. Smoking cessation advised.   I personally performed the services described in this documentation, which was scribed in my presence. The recorded information has been reviewed and is accurate.  BP 137/78 mmHg  Pulse 83  Temp(Src) 98.6 F (37 C) (Oral)  Resp 18  SpO2 98%  Meds ordered this encounter  Medications  . doxycycline (VIBRAMYCIN) 100 MG capsule    Sig: Take 1 capsule (100 mg total) by mouth 2 (two) times daily. One po bid x 7 days    Dispense:  14 capsule    Refill:  0    Order Specific Question:  Supervising Provider    Answer:  Eber Hong [[id number]]        Jalia Zuniga Camprubi-Soms, PA-C 09/22/15 1758  Pricilla Loveless, MD 09/23/15 0023

## 2015-09-22 NOTE — Discharge Instructions (Signed)
Apply warm compresses to jaw throughout the day. Take antibiotic until finished. Take tylenol and motrin as directed, as needed for pain. Followup with a dentist is very important for ongoing evaluation and management of recurrent dental pain. Use the list below to find a dentist. STOP SMOKING! Return to emergency department for emergent changing or worsening symptoms.   Dental Caries Dental caries is tooth decay. This decay can cause a hole in teeth (cavity) that can get bigger and deeper over time. HOME CARE  Brush and floss your teeth. Do this at least two times a day.  Use a fluoride toothpaste.  Use a mouth rinse if told by your dentist or doctor.  Eat less sugary and starchy foods. Drink less sugary drinks.  Avoid snacking often on sugary and starchy foods. Avoid sipping often on sugary drinks.  Keep regular checkups and cleanings with your dentist.  Use fluoride supplements if told by your dentist or doctor.  Allow fluoride to be applied to teeth if told by your dentist or doctor.   This information is not intended to replace advice given to you by your health care provider. Make sure you discuss any questions you have with your health care provider.   Document Released: 05/31/2008 Document Revised: 09/12/2014 Document Reviewed: 08/24/2012 Elsevier Interactive Patient Education 2016 Elsevier Inc.  Dental Pain Dental pain may be caused by many things, including:  Tooth decay (cavities or caries). Cavities cause the nerve of your tooth to be open to air and hot or cold temperatures. This can cause pain or discomfort.  Abscess or infection. A dental abscess is an area that is full of infected pus from a bacterial infection in the inner part of the tooth (pulp). It usually happens at the end of the tooth's root.  Injury.  An unknown reason (idiopathic). Your pain may be mild or severe. It may only happen when:  You are chewing.  You are exposed to hot or cold  temperature.  You are eating or drinking sugary foods or beverages, such as:  Soda.  Candy. Your pain may also be there all of the time. HOME CARE Watch your dental pain for any changes. Do these things to lessen your discomfort:  Take medicines only as told by your dentist.  If your dentist tells you to take an antibiotic medicine, finish all of it even if you start to feel better.  Keep all follow-up visits as told by your dentist. This is important.  Do not apply heat to the outside of your face.  Rinse your mouth or gargle with salt water if told by your dentist. This helps with pain and swelling.  You can make salt water by adding  tsp of salt to 1 cup of warm water.  Apply ice to the painful area of your face:  Put ice in a plastic bag.  Place a towel between your skin and the bag.  Leave the ice on for 20 minutes, 2-3 times per day.  Avoid foods or drinks that cause you pain, such as:  Very hot or very cold foods or drinks.  Sweet or sugary foods or drinks. GET HELP IF:  Your pain is not helped with medicines.  Your symptoms are worse.  You have new symptoms. GET HELP RIGHT AWAY IF:  You cannot open your mouth.  You are having trouble breathing or swallowing.  You have a fever.  Your face, neck, or jaw is puffy (swollen).   This information is not  intended to replace advice given to you by your health care provider. Make sure you discuss any questions you have with your health care provider.   Document Released: 02/08/2008 Document Revised: 01/06/2015 Document Reviewed: 08/18/2014 Elsevier Interactive Patient Education 2016 [employer].  Smoking Cessation, Tips for Success If you are ready to quit smoking, congratulations! You have chosen to help yourself be healthier. Cigarettes bring nicotine, tar, carbon monoxide, and other irritants into your body. Your lungs, heart, and blood vessels will be able to work better without these poisons. There are  many different ways to quit smoking. Nicotine gum, nicotine patches, a nicotine inhaler, or nicotine nasal spray can help with physical craving. Hypnosis, support groups, and medicines help break the habit of smoking. WHAT THINGS CAN I DO TO MAKE QUITTING EASIER?  Here are some tips to help you quit for good:  Pick a date when you will quit smoking completely. Tell all of your friends and family about your plan to quit on that date.  Do not try to slowly cut down on the number of cigarettes you are smoking. Pick a quit date and quit smoking completely starting on that day.  Throw away all cigarettes.   Clean and remove all ashtrays from your home, work, and car.  On a card, write down your reasons for quitting. Carry the card with you and read it when you get the urge to smoke.  Cleanse your body of nicotine. Drink enough water and fluids to keep your urine clear or pale yellow. Do this after quitting to flush the nicotine from your body.  Learn to predict your moods. Do not let a bad situation be your excuse to have a cigarette. Some situations in your life might tempt you into wanting a cigarette.  Never have "just one" cigarette. It leads to wanting another and another. Remind yourself of your decision to quit.  Change habits associated with smoking. If you smoked while driving or when feeling stressed, try other activities to replace smoking. Stand up when drinking your coffee. Brush your teeth after eating. Sit in a different chair when you read the paper. Avoid alcohol while trying to quit, and try to drink fewer caffeinated beverages. Alcohol and caffeine may urge you to smoke.  Avoid foods and drinks that can trigger a desire to smoke, such as sugary or spicy foods and alcohol.  Ask people who smoke not to smoke around you.  Have something planned to do right after eating or having a cup of coffee. For example, plan to take a walk or exercise.  Try a relaxation exercise to calm  you down and decrease your stress. Remember, you may be tense and nervous for the first 2 weeks after you quit, but this will pass.  Find new activities to keep your hands busy. Play with a pen, coin, or rubber band. Doodle or draw things on paper.  Brush your teeth right after eating. This will help cut down on the craving for the taste of tobacco after meals. You can also try mouthwash.   Use oral substitutes in place of cigarettes. Try using lemon drops, carrots, cinnamon sticks, or chewing gum. Keep them handy so they are available when you have the urge to smoke.  When you have the urge to smoke, try deep breathing.  Designate your home as a nonsmoking area.  If you are a heavy smoker, ask your health care provider about a prescription for nicotine chewing gum. It can ease your  withdrawal from nicotine.  Reward yourself. Set aside the cigarette money you save and buy yourself something nice.  Look for support from others. Join a support group or smoking cessation program. Ask someone at home or at work to help you with your plan to quit smoking.  Always ask yourself, "Do I need this cigarette or is this just a reflex?" Tell yourself, "Today, I choose not to smoke," or "I do not want to smoke." You are reminding yourself of your decision to quit.  Do not replace cigarette smoking with electronic cigarettes (commonly called e-cigarettes). The safety of e-cigarettes is unknown, and some may contain harmful chemicals.  If you relapse, do not give up! Plan ahead and think about what you will do the next time you get the urge to smoke. HOW WILL I FEEL WHEN I QUIT SMOKING? You may have symptoms of withdrawal because your body is used to nicotine (the addictive substance in cigarettes). You may crave cigarettes, be irritable, feel very hungry, cough often, get headaches, or have difficulty concentrating. The withdrawal symptoms are only temporary. They are strongest when you first quit but will  go away within 10-14 days. When withdrawal symptoms occur, stay in control. Think about your reasons for quitting. Remind yourself that these are signs that your body is healing and getting used to being without cigarettes. Remember that withdrawal symptoms are easier to treat than the major diseases that smoking can cause.  Even after the withdrawal is over, expect periodic urges to smoke. However, these cravings are generally short lived and will go away whether you smoke or not. Do not smoke! WHAT RESOURCES ARE AVAILABLE TO HELP ME QUIT SMOKING? Your health care provider can direct you to community resources or hospitals for support, which may include:  Group support.  Education.  Hypnosis.  Therapy.   This information is not intended to replace advice given to you by your health care provider. Make sure you discuss any questions you have with your health care provider.   Document Released: 05/20/2004 Document Revised: 09/12/2014 Document Reviewed: 02/07/2013 Elsevier Interactive Patient Education 2016 [employer].   Emergency Department Resource Guide 1) Find a Doctor and Pay Out of Pocket Although you won't have to find out who is covered by your insurance plan, it is a good idea to ask around and get recommendations. You will then need to call the office and see if the doctor you have chosen will accept you as a new patient and what types of options they offer for patients who are self-pay. Some doctors offer discounts or will set up payment plans for their patients who do not have insurance, but you will need to ask so you aren't surprised when you get to your appointment.  2) Contact Your Local Health Department Not all health departments have doctors that can see patients for sick visits, but many do, so it is worth a call to see if yours does. If you don't know where your local health department is, you can check in your phone book. The CDC also has a tool to help you locate your  state's health department, and many state websites also have listings of all of their local health departments.  3) Find a Walk-in Clinic If your illness is not likely to be very severe or complicated, you may want to try a walk in clinic. These are popping up all over the country in pharmacies, drugstores, and shopping centers. They're usually staffed by nurse practitioners or  physician assistants that have been trained to treat common illnesses and complaints. They're usually fairly quick and inexpensive. However, if you have serious medical issues or chronic medical problems, these are probably not your best option.  No Primary Care Doctor: - Call [facility] Connect at  [phone] - they can help you locate a primary care doctor that  accepts your insurance, provides certain services, etc. - Physician Referral Service- [phone]  Chronic Pain Problems: Organization         Address  Phone   Notes  [facility]  [phone] Patients need to be referred by their primary care doctor.   Medication Assistance: Organization         Address  Phone   Notes  [facility] Medication [facility] [address] [place] [place], [place] [address] [phone] --Must be a resident of [facility] -- Must have NO insurance coverage whatsoever (no Medicaid/ Medicare, etc.) -- The pt. MUST have a primary care doctor that directs their care regularly and follows them in the community   [facility]  [phone]   [place]  [phone]     Dental Care: Organization         Address  Phone  Notes  [facility] Department of [facility] [address] [place], [place] [phone] Accepts children up to age 55 who are enrolled in [place] or [place] Health Choice; pregnant women with a Medicaid card; and children who have applied for Medicaid or [place] Health Choice, but were declined, whose parents can pay a reduced fee at time of service.   [facility] Department of [facility]  [address], [place] [phone] Accepts children up to age 52 who are enrolled in [place] or [place] Health Choice; pregnant women with a Medicaid card; and children who have applied for Medicaid or [place] Health Choice, but were declined, whose parents can pay a reduced fee at time of service.  [facility]  [address] [place], [place] [phone] Patients are seen by appointment only. Walk-ins are not accepted. [facility] will see patients 67 years of age and older. Monday - Tuesday (8am-5pm) Most Wednesdays (8:30-5pm) $30 per visit, cash only  [facility]  [address], [facility] [phone] Patients are seen by appointment only. Walk-ins are not accepted. [facility] will see patients 68 years of age and older. One Wednesday Evening (Monthly: Volunteer Based).  $30 per visit, cash only  [employer]  [phone] for adults; Children under age 42, call Graduate Pediatric Dentistry at [phone]. Children aged 15-14, please call [phone] to request a pediatric application.  Dental services are provided in all areas of dental care including fillings, crowns and bridges, complete and partial dentures, implants, gum treatment, root canals, and extractions. Preventive care is also provided. Treatment is provided to both adults and children. Patients are selected via a lottery and there is often a waiting list.   [facility] [address], [place]  [phone] [email or web address]   [facility] [address] [place], [place] [phone], Ext. [phone] Second and Fourth Thursday of each month, opens at 6:30 AM; Clinic ends at 9 AM.  Patients are seen on a first-come first-served basis, and a limited number are seen during each clinic.   [facility]  [address],  [place], [place] [phone]   Eligibility Requirements You must have lived in [place], [place], or [place] counties for at least the last three months.   You cannot be eligible for state or federal sponsored [place], including [employer], [place], or [employer].   You generally cannot be eligible for healthcare insurance through your employer.    How to apply: Eligibility screenings are held every Tuesday and Wednesday afternoon from 1:00 pm until 4:00 pm. You do not need an appointment for the interview!  [facility] [address], [place], [place] [phone]   [facility] Health Department  [phone]   [facility] Health Department  [phone]   [facility] Health Department  [phone]

## 2016-07-25 ENCOUNTER — Emergency Department
Admission: EM | Admit: 2016-07-25 | Discharge: 2016-07-25 | Disposition: A | Discharge status: Home / Self Care | Payer: Self-pay | Attending: Emergency Medicine | Admitting: Emergency Medicine

## 2016-07-25 ENCOUNTER — Emergency Department: Payer: Self-pay

## 2016-07-25 ENCOUNTER — Encounter: Payer: Self-pay | Admitting: *Deleted

## 2016-07-25 DIAGNOSIS — M549 Dorsalgia, unspecified: Secondary | ICD-10-CM | POA: Insufficient documentation

## 2016-07-25 DIAGNOSIS — F17210 Nicotine dependence, cigarettes, uncomplicated: Secondary | ICD-10-CM | POA: Insufficient documentation

## 2016-07-25 DIAGNOSIS — R110 Nausea: Secondary | ICD-10-CM | POA: Insufficient documentation

## 2016-07-25 DIAGNOSIS — R109 Unspecified abdominal pain: Secondary | ICD-10-CM | POA: Insufficient documentation

## 2016-07-25 LAB — URINALYSIS COMPLETE WITH MICROSCOPIC (ARMC ONLY)
BACTERIA UA: NONE SEEN
Bilirubin Urine: NEGATIVE
GLUCOSE, UA: NEGATIVE mg/dL
HGB URINE DIPSTICK: NEGATIVE
Ketones, ur: NEGATIVE mg/dL
LEUKOCYTES UA: NEGATIVE
Nitrite: NEGATIVE
PH: 7 (ref 5.0–8.0)
Protein, ur: NEGATIVE mg/dL
SQUAMOUS EPITHELIAL / LPF: NONE SEEN
Specific Gravity, Urine: 1.019 (ref 1.005–1.030)

## 2016-07-25 LAB — CBC
HEMATOCRIT: 39.7 % — AB (ref 40.0–52.0)
Hemoglobin: 14 g/dL (ref 13.0–18.0)
MCH: 30.5 pg (ref 26.0–34.0)
MCHC: 35.2 g/dL (ref 32.0–36.0)
MCV: 86.6 fL (ref 80.0–100.0)
PLATELETS: 258 10*3/uL (ref 150–440)
RBC: 4.58 MIL/uL (ref 4.40–5.90)
RDW: 13.1 % (ref 11.5–14.5)
WBC: 6.4 10*3/uL (ref 3.8–10.6)

## 2016-07-25 LAB — BASIC METABOLIC PANEL
ANION GAP: 9 (ref 5–15)
BUN: 8 mg/dL (ref 6–20)
CHLORIDE: 100 mmol/L — AB (ref 101–111)
CO2: 27 mmol/L (ref 22–32)
CREATININE: 0.87 mg/dL (ref 0.61–1.24)
Calcium: 9.5 mg/dL (ref 8.9–10.3)
GFR calc non Af Amer: >60 mL/min (ref >60)
GLUCOSE: 93 mg/dL (ref 65–99)
POTASSIUM: 3.4 mmol/L — AB (ref 3.5–5.1)
SODIUM: 136 mmol/L (ref 135–145)

## 2016-07-25 MED ORDER — ONDANSETRON HCL 4 MG/2ML IJ SOLN
4.0000 mg | Freq: Once | INTRAMUSCULAR | Status: AC
  Administered 2016-07-25: 4 mg via INTRAVENOUS

## 2016-07-25 MED ORDER — KETOROLAC TROMETHAMINE 30 MG/ML IJ SOLN
30.0000 mg | Freq: Once | INTRAMUSCULAR | Status: AC
  Administered 2016-07-25: 30 mg via INTRAVENOUS

## 2016-07-25 MED ORDER — MORPHINE SULFATE (PF) 4 MG/ML IV SOLN
4.0000 mg | Freq: Once | INTRAVENOUS | Status: AC
  Administered 2016-07-25: 4 mg via INTRAVENOUS

## 2016-07-25 MED ORDER — SODIUM CHLORIDE 0.9 % IV BOLUS (SEPSIS)
1000.0000 mL | Freq: Once | INTRAVENOUS | Status: AC
  Administered 2016-07-25: 1000 mL via INTRAVENOUS

## 2016-07-25 MED ORDER — KETOROLAC TROMETHAMINE 30 MG/ML IJ SOLN
INTRAMUSCULAR | Status: AC
  Administered 2016-07-25: 30 mg via INTRAVENOUS
  Filled 2016-07-25: qty 1

## 2016-07-25 MED ORDER — MORPHINE SULFATE (PF) 4 MG/ML IV SOLN
INTRAVENOUS | Status: AC
  Administered 2016-07-25: 4 mg via INTRAVENOUS
  Filled 2016-07-25: qty 1

## 2016-07-25 MED ORDER — HYDROCODONE-ACETAMINOPHEN 5-325 MG PO TABS: 1.0000 | ORAL_TABLET | ORAL | 0 refills | Status: DC | PRN

## 2016-07-25 MED ORDER — ONDANSETRON HCL 4 MG/2ML IJ SOLN
INTRAMUSCULAR | Status: AC
  Administered 2016-07-25: 4 mg via INTRAVENOUS
  Filled 2016-07-25: qty 2

## 2016-07-25 NOTE — ED Notes (Signed)
Patient transported to CT 

## 2016-07-25 NOTE — ED Notes (Signed)
Patient states unable to urinate at this time. Urine specimen cup given to patient

## 2016-07-25 NOTE — ED Provider Notes (Signed)
[facility] Emergency Department Provider Note  Time seen: 8:48 PM  I have reviewed the triage vital signs and the nursing notes.   HISTORY  Chief Complaint Flank Pain    HPI James BromeCameron R Sardinha Jr. is a 30 y.o. male with a past medical history of kidney stones who presents to the emergency department with left flank pain. According to the patient he developed left flank painyesterday which has worsened significantly today. States he is very nauseated feels like he needs to vomit. Denies diarrhea. Denies fever. States a history of kidney stones which feels identical to this. Denies dysuria or hematuria. Describes his left flank pain as severe sharp pain.  No past medical history on file.  There are no active problems to display for this patient.   No past surgical history on file.  Prior to Admission medications   Medication Sig Start Date End Date Taking? Authorizing Provider  doxycycline (VIBRAMYCIN) 100 MG capsule Take 1 capsule (100 mg total) by mouth 2 (two) times daily. One po bid x 7 days 09/22/15   Mercedes Camprubi-Soms, PA-C  meloxicam (MOBIC) 7.5 MG tablet Take 1 tablet (7.5 mg total) by mouth daily. 05/01/13   Jimmie MollyPaolo Coll, MD  naproxen (NAPROSYN) 500 MG tablet Take 1 tablet (500 mg total) by mouth 2 (two) times daily with a meal. 01/15/14   Junius FinnerErin O'Malley, PA-C  oxyCODONE-acetaminophen (PERCOCET/ROXICET) 5-325 MG per tablet Take 1-2 tablets by mouth every 6 (six) hours as needed for severe pain. 01/15/14   Junius FinnerErin O'Malley, PA-C  penicillin v potassium (VEETID) 500 MG tablet Take 1 tablet (500 mg total) by mouth 4 (four) times daily. 01/15/14   Junius FinnerErin O'Malley, PA-C  traMADol (ULTRAM) 50 MG tablet Take 1 tablet (50 mg total) by mouth every 6 (six) hours as needed for pain. 05/01/13   Jimmie MollyPaolo Coll, MD    No Known Allergies  No family history on file.  Social History Social History  Substance Use Topics  . Smoking status: Current Every Day Smoker    Types:  Cigarettes  . Smokeless tobacco: Never Used  . Alcohol use Yes    Review of Systems Constitutional: Negative for fever. Cardiovascular: Negative for chest pain. Respiratory: Negative for shortness of breath. Gastrointestinal: Positive for left flank pain. Positive nausea. Negative for diarrhea. Genitourinary: Negative for dysuria. Negative for hematuria. Musculoskeletal: Positive for left back pain. Neurological: Negative for headache 10-point ROS otherwise negative.  ____________________________________________   PHYSICAL EXAM:  VITAL SIGNS: ED Triage Vitals  Enc Vitals Group     BP 07/25/16 1831 137/87     Pulse Rate 07/25/16 1831 76     Resp 07/25/16 1831 19     Temp 07/25/16 1831 98.3 F (36.8 C)     Temp Source 07/25/16 1831 Oral     SpO2 07/25/16 1831 98 %     Weight 07/25/16 1831 150 lb (68 kg)     Height 07/25/16 1831 5\' 10"  (1.778 m)     Head Circumference --      Peak Flow --      Pain Score 07/25/16 1841 10     Pain Loc --      Pain Edu? --      Excl. in GC? --     Constitutional: Alert and oriented. Moderate distress due to pain. Eyes: Normal exam ENT   Head: Normocephalic and atraumatic   Mouth/Throat: Mucous membranes are moist. Cardiovascular: Normal rate, regular rhythm. Respiratory: Normal respiratory effort without tachypnea nor  retractions. Breath sounds are clear  Gastrointestinal: Soft, nontender to palpation. No distention. Moderate left CVA tenderness. Musculoskeletal: Nontender with normal range of motion in all extremities. Neurologic:  Normal speech and language. No gross focal neurologic deficits  Skin:  Skin is warm, dry and intact.  Psychiatric: Mood and affect are normal for situation.  ____________________________________________     RADIOLOGY  CT negative.  ____________________________________________   INITIAL IMPRESSION / ASSESSMENT AND PLAN / ED COURSE  Pertinent labs & imaging results that were available  during my care of the patient were reviewed by me and considered in my medical decision making (see chart for details).  Patient appears to be in significant pain which he describes as severe left flank pain which is sharp in nature. We are currently awaiting labs. Given the patient's history we will proceed with a CT abdomen/pelvis further evaluate. We will dose pain and nausea medication, IV hydrate while awaiting lab results and imaging results. Patient agreeable to plan.  CT negative. Given the red blood cells in the urine, it is possible that the patient has passed a ureteral stone. Patient appears much more comfortable at this time. States his pain is now a 3 or 4 out of 10 compared to a 10 out of 10 when he arrived. We will discharge with a very short course of pain medication. The patient is follow up with his primary care doctor.  ____________________________________________   FINAL CLINICAL IMPRESSION(S) / ED DIAGNOSES  Left flank pain    Minna AntisKevin Harlo Fabela, MD 07/25/16 2225

## 2016-07-25 NOTE — ED Triage Notes (Signed)
Pt to triage via wheelchair.  Pt has left flank pain since yesterday   Hx of kidney stones.  Pt reports nausea.

## 2016-07-25 NOTE — ED Notes (Signed)
Pt c/o lumbar back pain on the left side x2 day. Denies any urinary symptoms. Pt has HX of Kidney stones.

## 2016-07-25 NOTE — ED Notes (Signed)
Pt unable to void at this time. 

## 2016-07-27 LAB — URINE CULTURE

## 2017-03-11 ENCOUNTER — Emergency Department
Admission: EM | Admit: 2017-03-11 | Discharge: 2017-03-11 | Disposition: A | Discharge status: Home / Self Care | Payer: Self-pay | Attending: Emergency Medicine | Admitting: Emergency Medicine

## 2017-03-11 ENCOUNTER — Encounter: Payer: Self-pay | Admitting: Emergency Medicine

## 2017-03-11 DIAGNOSIS — K0889 Other specified disorders of teeth and supporting structures: Secondary | ICD-10-CM | POA: Insufficient documentation

## 2017-03-11 DIAGNOSIS — K047 Periapical abscess without sinus: Secondary | ICD-10-CM | POA: Insufficient documentation

## 2017-03-11 DIAGNOSIS — Z791 Long term (current) use of non-steroidal anti-inflammatories (NSAID): Secondary | ICD-10-CM | POA: Insufficient documentation

## 2017-03-11 DIAGNOSIS — F17210 Nicotine dependence, cigarettes, uncomplicated: Secondary | ICD-10-CM | POA: Insufficient documentation

## 2017-03-11 MED ORDER — LIDOCAINE-EPINEPHRINE 2 %-1:100000 IJ SOLN
1.7000 mL | Freq: Once | INTRAMUSCULAR | Status: AC
  Administered 2017-03-11: 1.7 mL via INTRADERMAL
  Filled 2017-03-11: qty 1.7

## 2017-03-11 MED ORDER — SULFAMETHOXAZOLE-TRIMETHOPRIM 800-160 MG PO TABS: 1.0000 | ORAL_TABLET | Freq: Two times a day (BID) | ORAL | 1 refills | Status: DC

## 2017-03-11 NOTE — ED Triage Notes (Signed)
Patient presents to the ED with dental pain to the upper left area of his mouth.  Patient states, "I think the gums swelled up over the teeth."  Patient reports pain x 1 week.  Patient states he does not have insurance so he has not been able to go to the dentist.

## 2017-03-11 NOTE — ED Notes (Signed)

## 2017-03-11 NOTE — Discharge Instructions (Signed)
OPTIONS FOR DENTAL FOLLOW UP CARE ° °[place] Department [facility] Health and Human Services - Local Safety Net Dental Clinics °http://www.ncdhhs.gov/dph/oralhealth/services/safetynetclinics.htm °  °[facility] (336-562-3123) ° °[facility] [place] ([phone]) ° °[facility] ([phone] ext [phone]) ° °[facility] ([phone]) ° °[facility] ([phone]) °This clinic caters to the indigent population and is on a lottery system. °Location: °[facility], [address], [place] °Clinic Hours: °Wednesdays from 6pm - 9pm, patients seen by a lottery system. °For dates, call or go to www.med.[facility].edu/[facility]/patients/Dental-[facility] °Services: °Cleanings, fillings and simple extractions. °Payment Options: °DENTAL WORK IS FREE OF CHARGE. Bring proof of income or support. °Best way to get seen: °Arrive at 5:15 pm - this is a lottery, NOT first come/first serve, so arriving earlier will not increase your chances of being seen. °  °  °[facility] °[phone] °Select option 1 for emergencies °  °Location: °[facility], [address], [place] °Clinic Hours: °No walk-ins accepted - call the day before to schedule an appointment. °Check in times are 9:30 am and 1:30 pm. °Services: °Simple extractions, temporary fillings, pulpectomy/pulp debridement, uncomplicated abscess drainage. °Payment Options: °PAYMENT IS DUE AT THE TIME OF SERVICE.  Fee is usually $100-200, additional surgical procedures (e.g. abscess drainage) may be extra. °Cash, checks, Visa/MasterCard accepted.  Can file Medicaid if patient is covered for dental - patient should call case worker to check. °No discount for [facility] patients. °Best way to get seen: °MUST call the day before and get onto the schedule. Can usually be seen the next 1-2 days. No walk-ins accepted. °  °  °[facility] °[phone] °   °Location: °[facility], [address], [place] °Clinic Hours: °M, W, Th, F 8am or 1:30pm, Tues 9a or 1:30 - first come/first served. °Services: °Simple extractions, temporary fillings, uncomplicated abscess drainage.  You do not need to be an [place] resident. °Payment Options: °PAYMENT IS DUE AT THE TIME OF SERVICE. °Dental insurance, otherwise sliding scale - bring proof of income or support. °Depending on income and treatment needed, cost is usually $50-200. °Best way to get seen: °Arrive early as it is first come/first served. °  °  °[facility] °[phone] °  °Location: °[address] °Clinic Hours: °Mon-Thu 8a-5p °Services: °Most basic dental services including extractions and fillings. °Payment Options: °PAYMENT IS DUE AT THE TIME OF SERVICE. °Sliding scale, up to 50% off - bring proof if income or support. °Medicaid with dental option accepted. °Best way to get seen: °Call to schedule an appointment, can usually be seen within 2 weeks OR they will try to see walk-ins - show up at 8a or 2p (you may have to wait). °  °  °[facility] °[phone] °[place] RESIDENTS ONLY °  °Location: °[facility], [address], [place], [place] [address] °Clinic Hours: By appointment only. °Monday - Thursday 8am-5pm, Friday 8am-12pm °Services: Cleanings, fillings, extractions. °Payment Options: °PAYMENT IS DUE AT THE TIME OF SERVICE. °Cash, Visa or MasterCard. Sliding scale - $30 minimum per service. °Best way to get seen: °Come in to office, complete packet and make an appointment - need proof of income °or support monies for each household member and proof of [place] residence. °Usually takes about a month to get in. °  °  °[facility] °[phone] °  °Location: °[address]   [place] °Clinic Hours: Walk-in Urgent Care Dental Services are offered Monday-Friday  mornings only. °The numbers of emergencies accepted daily is limited to the number of °providers available. °Maximum 15 - Mondays, Wednesdays & Thursdays °Maximum 10 - Tuesdays & Fridays °Services: °You do not need to be a  [place] resident to be seen for a dental emergency. °Emergencies are defined as pain, swelling, abnormal bleeding, or dental trauma. Walkins will receive x-rays if needed. °NOTE: Dental cleaning is not an emergency. °Payment Options: °PAYMENT IS DUE AT THE TIME OF SERVICE. °Minimum co-pay is $40.00 for uninsured patients. °Minimum co-pay is $3.00 for Medicaid with dental coverage. °Dental Insurance is accepted and must be presented at time of visit. °Medicare does not cover dental. °Forms of payment: Cash, credit card, checks. °Best way to get seen: °If not previously registered with the clinic, walk-in dental registration begins at 7:15 am and is on a first come/first serve basis. °If previously registered with the clinic, call to make an appointment. °  °  °[facility] °[phone] °[place] RESIDENTS ONLY °  °Location: °[address], [place], [place] °Clinic Hours: °Mon-Thu 10a-2p °Services: Extractions only! °Payment Options: °FREE (donations accepted) - bring proof of income or support °Best way to get seen: °Call and schedule an appointment OR come at 8am on the 1st Monday of every month (except for holidays) when it is first come/first served. °  °  °[facility] °[phone] °  °Location: °[address], [place] °Clinic Hours: °Friday mornings °Services, Payment Options, Best way to get seen: °Call for info °

## 2017-03-11 NOTE — ED Provider Notes (Signed)
[facility] Emergency Department Provider Note   ____________________________________________   I have reviewed the triage vital signs and the nursing notes.   HISTORY  Chief Complaint Dental Pain    HPI James Beard. is a 31 y.o. male presents with right upper jaw dental pain that has been progressing over the last 7 days. Patient reports the teeth along the right upper jaw have been broken for some time however have not been giving him a problem prior to onset of current symptoms. Patient does not see a dentist regularly secondary to no insurance. Patient denies any recent trauma to the mouth, difficulty swallowing, throat pain or palpable tenderness along the neck. Patient denies headache, vision changes, chest pain, chest tightness, shortness of breath, abdominal pain, nausea and vomiting.  History reviewed. No pertinent past medical history.  There are no active problems to display for this patient.   History reviewed. No pertinent surgical history.  Prior to Admission medications   Medication Sig Start Date End Date Taking? Authorizing Provider  doxycycline (VIBRAMYCIN) 100 MG capsule Take 1 capsule (100 mg total) by mouth 2 (two) times daily. One po bid x 7 days 09/22/15   Street, [place], [place]  HYDROcodone-acetaminophen (NORCO/VICODIN) 5-325 MG tablet Take 1 tablet by mouth every 4 (four) hours as needed. 07/25/16   Minna Antis, MD  meloxicam (MOBIC) 7.5 MG tablet Take 1 tablet (7.5 mg total) by mouth daily. 05/01/13   Jimmie Molly, MD  naproxen (NAPROSYN) 500 MG tablet Take 1 tablet (500 mg total) by mouth 2 (two) times daily with a meal. 01/15/14   Lurene Shadow, PA-C  oxyCODONE-acetaminophen (PERCOCET/ROXICET) 5-325 MG per tablet Take 1-2 tablets by mouth every 6 (six) hours as needed for severe pain. 01/15/14   Lurene Shadow, PA-C  penicillin v potassium (VEETID) 500 MG tablet Take 1 tablet (500 mg total) by mouth 4 (four) times  daily. 01/15/14   Lurene Shadow, PA-C  sulfamethoxazole-trimethoprim (BACTRIM DS,SEPTRA DS) 800-160 MG tablet Take 1 tablet by mouth 2 (two) times daily. 03/11/17   Phillip Maffei M, PA-C  traMADol (ULTRAM) 50 MG tablet Take 1 tablet (50 mg total) by mouth every 6 (six) hours as needed for pain. 05/01/13   Jimmie Molly, MD    Allergies Patient has no known allergies.  No family history on file.  Social History Social History  Substance Use Topics  . Smoking status: Current Every Day Smoker    Packs/day: 0.50    Types: Cigarettes  . Smokeless tobacco: Never Used  . Alcohol use Yes     Comment: twice a month    Review of Systems Constitutional: Positive for fever/chills Eyes: No visual changes. ENT:  Negative for sore throat and for difficulty swallowing. Right upper jaw dental pain. Cardiovascular: Denies chest pain. Respiratory: Denies cough Denies shortness of breath. Skin: Negative for rash. Neurological: Negative for headaches.  ____________________________________________   PHYSICAL EXAM:  VITAL SIGNS: ED Triage Vitals [03/11/17 1436]  Enc Vitals Group     BP 133/71     Pulse Rate (!) 53     Resp 18     Temp 99 F (37.2 C)     Temp Source Oral     SpO2 100 %     Weight      Height 5\' 10"  (1.778 m)     Head Circumference      Peak Flow      Pain Score 8  Pain Loc      Pain Edu?      Excl. in GC?     Constitutional: Alert and oriented. Well appearing and in no acute distress.  Head: Normocephalic and atraumatic. Eyes: Conjunctivae are normal. PERRL. Normal extraocular movements. No evidence of papilledema on limited exam. Nose: No congestion/rhinorrhea Mouth/Throat: Mucous membranes are moist. Oropharynx Normal without swelling. Tonsils symmetrical bilaterally with uvula midline. Right upper jaw gum line erythematous with significant dental caries. Multiple broken teeth.  Neck: Supple. Hematological/Lymphatic/Immunological: No cervical  lymphadenopathy. Cardiovascular: Normal rate, regular rhythm. Normal distal pulses. Respiratory: Normal respiratory effort.  Neurologic: Normal speech and language.  Skin:  Skin is warm, dry and intact. No rash noted. Psychiatric: Mood and affect are normal.  ____________________________________________   LABS (all labs ordered are listed, but only abnormal results are displayed)  Labs Reviewed - No data to display ____________________________________________  EKG none ____________________________________________  RADIOLOGY none ____________________________________________   PROCEDURES  Procedure(s) performed: DENTAL BLOCK  Performed by: Clois Comberraci M Juan Kissoon Consent: Verbal consent obtained. Required items: devices and special equipment available Time out: Immediately prior to procedure a "time out" was called to verify the correct patient, procedure, equipment, support staff and site/side marked as required.  Indication: Dental pain Nerve block body site: Posterior superior alveolar nerve block   Preparation: Patient was prepped and draped in the usual sterile fashion. Needle gauge: 27 G Location technique: Height of the muccobuccal fold between the second and first molar, right upper jaw anatomical landmarks  Local anesthetic: Xylocaine 2%: 1-10,000   Anesthetic total: 1.7 ml  Outcome: pain improved Patient tolerance: Patient tolerated the procedure well with no immediate complications.     Critical Care performed: no ____________________________________________   INITIAL IMPRESSION / ASSESSMENT AND PLAN / ED COURSE  Pertinent labs & imaging results that were available during my care of the patient were reviewed by me and considered in my medical decision making (see chart for details).  Patient presents to emergency department right upper jaw dental pain. History and physical exam findings are reassuring symptoms are consistent with dental pain associated with  dental infection. Patient responded well dental block during the course of care in the emergency department. See procedure note above. Patient will be prescribed Bactrim for antibiotic coverage. Patient advised to follow up with a dental clinic for dental caries and affected tooth or return to the emergency department if symptoms return or worsen. Patient informed of clinical course, understand medical decision-making process, and agree with plan.    ____________________________________________   FINAL CLINICAL IMPRESSION(S) / ED DIAGNOSES  Final diagnoses:  Pain, dental  Dental infection       NEW MEDICATIONS STARTED DURING THIS VISIT:  New Prescriptions   SULFAMETHOXAZOLE-TRIMETHOPRIM (BACTRIM DS,SEPTRA DS) 800-160 MG TABLET    Take 1 tablet by mouth 2 (two) times daily.     Note:  This document was prepared using Dragon voice recognition software and may include unintentional dictation errors.   Clois ComberLittle, Maricarmen Braziel M, PA-C 03/11/17 1555    Emily FilbertWilliams, Jonathan E, MD 03/18/17 [phone]

## 2017-03-18 ENCOUNTER — Emergency Department: Payer: Self-pay

## 2017-03-18 ENCOUNTER — Encounter: Payer: Self-pay | Admitting: Emergency Medicine

## 2017-03-18 ENCOUNTER — Emergency Department
Admission: EM | Admit: 2017-03-18 | Discharge: 2017-03-18 | Disposition: A | Discharge status: Home / Self Care | Payer: Self-pay | Attending: Emergency Medicine | Admitting: Emergency Medicine

## 2017-03-18 DIAGNOSIS — M4854XA Collapsed vertebra, not elsewhere classified, thoracic region, initial encounter for fracture: Secondary | ICD-10-CM | POA: Insufficient documentation

## 2017-03-18 DIAGNOSIS — Y939 Activity, unspecified: Secondary | ICD-10-CM | POA: Insufficient documentation

## 2017-03-18 DIAGNOSIS — S22000A Wedge compression fracture of unspecified thoracic vertebra, initial encounter for closed fracture: Secondary | ICD-10-CM

## 2017-03-18 DIAGNOSIS — Y929 Unspecified place or not applicable: Secondary | ICD-10-CM | POA: Insufficient documentation

## 2017-03-18 DIAGNOSIS — T148XXA Other injury of unspecified body region, initial encounter: Secondary | ICD-10-CM | POA: Insufficient documentation

## 2017-03-18 DIAGNOSIS — Y999 Unspecified external cause status: Secondary | ICD-10-CM | POA: Insufficient documentation

## 2017-03-18 DIAGNOSIS — Z791 Long term (current) use of non-steroidal anti-inflammatories (NSAID): Secondary | ICD-10-CM | POA: Insufficient documentation

## 2017-03-18 DIAGNOSIS — Z23 Encounter for immunization: Secondary | ICD-10-CM | POA: Insufficient documentation

## 2017-03-18 DIAGNOSIS — F17210 Nicotine dependence, cigarettes, uncomplicated: Secondary | ICD-10-CM | POA: Insufficient documentation

## 2017-03-18 DIAGNOSIS — S01512A Laceration without foreign body of oral cavity, initial encounter: Secondary | ICD-10-CM | POA: Insufficient documentation

## 2017-03-18 MED ORDER — TETANUS-DIPHTH-ACELL PERTUSSIS 5-2.5-18.5 LF-MCG/0.5 IM SUSP
0.5000 mL | Freq: Once | INTRAMUSCULAR | Status: AC
  Administered 2017-03-18: 0.5 mL via INTRAMUSCULAR
  Filled 2017-03-18: qty 0.5

## 2017-03-18 MED ORDER — LIDOCAINE-EPINEPHRINE-TETRACAINE (LET) SOLUTION
3.0000 mL | Freq: Once | NASAL | Status: DC
  Filled 2017-03-18: qty 3

## 2017-03-18 MED ORDER — CLINDAMYCIN HCL 150 MG PO CAPS
300.0000 mg | ORAL_CAPSULE | Freq: Once | ORAL | Status: AC
  Administered 2017-03-18: 300 mg via ORAL
  Filled 2017-03-18: qty 2

## 2017-03-18 MED ORDER — CLINDAMYCIN HCL 300 MG PO CAPS: 300.0000 mg | ORAL_CAPSULE | Freq: Three times a day (TID) | ORAL | 0 refills | Status: AC

## 2017-03-18 NOTE — ED Notes (Signed)
Patient transported to X-ray 
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
## 2017-03-18 NOTE — ED Notes (Signed)
Pt currently on bactrim for tooth abscess.
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
## 2018-01-15 ENCOUNTER — Emergency Department (HOSPITAL_COMMUNITY)
Admission: EM | Admit: 2018-01-15 | Discharge: 2018-01-15 | Disposition: A | Discharge status: Home / Self Care | Payer: Self-pay | Attending: Emergency Medicine | Admitting: Emergency Medicine

## 2018-01-15 ENCOUNTER — Encounter (HOSPITAL_COMMUNITY): Payer: Self-pay | Admitting: Emergency Medicine

## 2018-01-15 DIAGNOSIS — F17210 Nicotine dependence, cigarettes, uncomplicated: Secondary | ICD-10-CM | POA: Insufficient documentation

## 2018-01-15 DIAGNOSIS — L237 Allergic contact dermatitis due to plants, except food: Secondary | ICD-10-CM | POA: Insufficient documentation

## 2018-01-15 HISTORY — DX: Dermatitis, unspecified: L30.9

## 2018-01-15 MED ORDER — HYDROXYZINE HCL 25 MG PO TABS: 25.0000 mg | ORAL_TABLET | Freq: Four times a day (QID) | ORAL | 0 refills | Status: DC | PRN

## 2018-01-15 MED ORDER — PREDNISONE 20 MG PO TABS: ORAL_TABLET | ORAL | 0 refills | Status: DC

## 2018-01-15 NOTE — ED Provider Notes (Signed)
[facility] EMERGENCY DEPARTMENT Provider Note   CSN: [id number] Arrival date & time: 01/15/18  1635     History   Chief Complaint No chief complaint on file.   HPI James Beard. is a 32 y.o. male presents emergency department for evaluation of tach dermatitis.  Patient states that he was exposed to poison sumac about 2 weeks ago.  He has had progressively worsening spread of his dermatitis over the past 2 weeks and now has poison ivy dermatitis on his genitals abdomen upper arms and neck.  He also has a history of chronic eczema and states that when he gets these types of contact dermatitis he gets very bad and prolonged cases.  He has been using hydrocortisone 1% cream without relief of his symptoms.  He denies fevers chills large bullae.  HPI  Past Medical History:  Diagnosis Date  . Eczema     There are no active problems to display for this patient.   No past surgical history on file.      Home Medications    Prior to Admission medications   Medication Sig Start Date End Date Taking? Authorizing Provider  doxycycline (VIBRAMYCIN) 100 MG capsule Take 1 capsule (100 mg total) by mouth 2 (two) times daily. One po bid x 7 days 09/22/15   [address], [place], [place]  HYDROcodone-acetaminophen (NORCO/VICODIN) 5-325 MG tablet Take 1 tablet by mouth every 4 (four) hours as needed. 07/25/16   Minna Antis, MD  hydrOXYzine (ATARAX/VISTARIL) 25 MG tablet Take 1 tablet (25 mg total) by mouth every 6 (six) hours as needed for anxiety. 01/15/18   Arthor Captain, PA-C  meloxicam (MOBIC) 7.5 MG tablet Take 1 tablet (7.5 mg total) by mouth daily. 05/01/13   Jimmie Molly, MD  naproxen (NAPROSYN) 500 MG tablet Take 1 tablet (500 mg total) by mouth 2 (two) times daily with a meal. 01/15/14   Lurene Shadow, PA-C  oxyCODONE-acetaminophen (PERCOCET/ROXICET) 5-325 MG per tablet Take 1-2 tablets by mouth every 6 (six) hours as needed for severe pain. 01/15/14   Lurene Shadow, PA-C  penicillin v potassium (VEETID) 500 MG tablet Take 1 tablet (500 mg total) by mouth 4 (four) times daily. 01/15/14   Lurene Shadow, PA-C  predniSONE (DELTASONE) 20 MG tablet 3 tabs po daily x 3 days, then 2 tabs x 3 days, then 1.5 tabs x 3 days, then 1 tab x 3 days, then 0.5 tabs x 3 days 01/15/18   Arthor Captain, PA-C  sulfamethoxazole-trimethoprim (BACTRIM DS,SEPTRA DS) 800-160 MG tablet Take 1 tablet by mouth 2 (two) times daily. 03/11/17   Little, Traci M, PA-C  traMADol (ULTRAM) 50 MG tablet Take 1 tablet (50 mg total) by mouth every 6 (six) hours as needed for pain. 05/01/13   Jimmie Molly, MD    Family History No family history on file.  Social History Social History   Tobacco Use  . Smoking status: Current Every Day Smoker    Packs/day: 1.00    Types: Cigarettes  . Smokeless tobacco: Never Used  Substance Use Topics  . Alcohol use: Yes    Comment: twice a month  . Drug use: Yes    Types: Marijuana     Allergies   Patient has no known allergies.   Review of Systems Review of Systems Positive for pruritic rash, negative for heat swelling drainage, fever, or chills  Physical Exam Updated Vital Signs BP 135/78 (BP Location: Right Arm)   Pulse Marland Kitchen)  59   Temp 100 F (37.8 C) (Oral)   Resp 16   Ht  (1.753 m)   Wt 69.9 kg (154 lb)   SpO2 98%   BMI 22.74 kg/m   Physical Exam  Constitutional: He appears well-developed and well-nourished. No distress.  HENT:  Head: Normocephalic and atraumatic.  Eyes: Conjunctivae are normal. No scleral icterus.  Neck: Normal range of motion. Neck supple.  Cardiovascular: Normal rate, regular rhythm and normal heart sounds.  Pulmonary/Chest: Effort normal and breath sounds normal. No respiratory distress.  Abdominal: Soft. There is no tenderness.  Musculoskeletal: He exhibits no edema.  Neurological: He is alert.  Skin: Skin is warm and dry. He is not diaphoretic.  Multiple singular erythematous vesiculopapular  lesions over the neck, upper extremities, lower abdomen and genitals.  No evidence of secondary infections  Psychiatric: His behavior is normal.  Nursing note and vitals reviewed.    ED Treatments / Results  Labs (all labs ordered are listed, but only abnormal results are displayed) Labs Reviewed - No data to display  EKG None  Radiology No results found.  Procedures Procedures (including critical care time)  Medications Ordered in ED Medications - No data to display   Initial Impression / Assessment and Plan / ED Course  I have reviewed the triage vital signs and the nursing notes.  Pertinent labs & imaging results that were available during my care of the patient were reviewed by me and considered in my medical decision making (see chart for details).         Patient with contact dermatitis. . Will treat with 2-week prednisone taper.  No signs of secondary infection. Follow up with pcp in 2-3 days. Return precautions discussed. Pt is safe for discharge at this time.          Final Clinical Impressions(s) / ED Diagnoses   Final diagnoses:  Allergic dermatitis due to poison sumac    ED Discharge Orders        Ordered    predniSONE (DELTASONE) 20 MG tablet     01/15/18 1751    hydrOXYzine (ATARAX/VISTARIL) 25 MG tablet  Every 6 hours PRN     01/15/18 1751       Arthor Captain, PA-C 01/15/18 1806    Benjiman Core, MD 01/15/18 [phone]

## 2018-01-15 NOTE — Discharge Instructions (Addendum)
Contact a health care provider if: °You have open sores in the rash area. °You have more redness, swelling, or pain in the affected area. °You have redness that spreads beyond the rash area. °You have fluid, blood, or pus coming from the affected area. °You have a fever. °You have a rash over a large area of your body. °You have a rash on your eyes, mouth, or genitals. °Your rash does not improve after a few days. °Get help right away if: °Your face swells or your eyes swell shut. °You have trouble breathing. °You have trouble swallowing. °

## 2018-01-15 NOTE — ED Triage Notes (Signed)
Pt reports exposure to poison plant 2 weeks ago. Has not had any OTC meds for this.

## 2018-03-21 ENCOUNTER — Encounter (HOSPITAL_COMMUNITY): Payer: Self-pay

## 2018-03-21 ENCOUNTER — Emergency Department (HOSPITAL_COMMUNITY)
Admission: EM | Admit: 2018-03-21 | Discharge: 2018-03-22 | Disposition: A | Discharge status: Home / Self Care | Payer: Self-pay | Attending: Emergency Medicine | Admitting: Emergency Medicine

## 2018-03-21 ENCOUNTER — Other Ambulatory Visit: Payer: Self-pay

## 2018-03-21 DIAGNOSIS — F17210 Nicotine dependence, cigarettes, uncomplicated: Secondary | ICD-10-CM | POA: Insufficient documentation

## 2018-03-21 DIAGNOSIS — Z79899 Other long term (current) drug therapy: Secondary | ICD-10-CM | POA: Insufficient documentation

## 2018-03-21 DIAGNOSIS — R1012 Left upper quadrant pain: Secondary | ICD-10-CM | POA: Insufficient documentation

## 2018-03-21 LAB — CBC
HEMATOCRIT: 44.2 % (ref 39.0–52.0)
HEMOGLOBIN: 14.5 g/dL (ref 13.0–17.0)
MCH: 30 pg (ref 26.0–34.0)
MCHC: 32.8 g/dL (ref 30.0–36.0)
MCV: 91.5 fL (ref 78.0–100.0)
Platelets: 353 10*3/uL (ref 150–400)
RBC: 4.83 MIL/uL (ref 4.22–5.81)
RDW: 13 % (ref 11.5–15.5)
WBC: 7.2 10*3/uL (ref 4.0–10.5)

## 2018-03-21 LAB — COMPREHENSIVE METABOLIC PANEL
ALBUMIN: 4.6 g/dL (ref 3.5–5.0)
ALT: 25 U/L (ref 0–44)
ANION GAP: 10 (ref 5–15)
AST: 31 U/L (ref 15–41)
Alkaline Phosphatase: 136 U/L — ABNORMAL HIGH (ref 38–126)
BUN: 14 mg/dL (ref 6–20)
CO2: 29 mmol/L (ref 22–32)
Calcium: 9.6 mg/dL (ref 8.9–10.3)
Chloride: 100 mmol/L (ref 98–111)
Creatinine, Ser: 0.84 mg/dL (ref 0.61–1.24)
GFR calc Af Amer: >60 mL/min (ref >60)
GFR calc non Af Amer: >60 mL/min (ref >60)
GLUCOSE: 98 mg/dL (ref 70–99)
POTASSIUM: 3.5 mmol/L (ref 3.5–5.1)
Sodium: 139 mmol/L (ref 135–145)
TOTAL PROTEIN: 8 g/dL (ref 6.5–8.1)
Total Bilirubin: 0.6 mg/dL (ref 0.3–1.2)

## 2018-03-21 LAB — URINALYSIS, ROUTINE W REFLEX MICROSCOPIC
BILIRUBIN URINE: NEGATIVE
Glucose, UA: NEGATIVE mg/dL
Hgb urine dipstick: NEGATIVE
Ketones, ur: NEGATIVE mg/dL
Leukocytes, UA: NEGATIVE
NITRITE: NEGATIVE
PH: 5 (ref 5.0–8.0)
Protein, ur: NEGATIVE mg/dL
SPECIFIC GRAVITY, URINE: 1.036 — AB (ref 1.005–1.030)

## 2018-03-21 LAB — LIPASE, BLOOD: Lipase: 28 U/L (ref 11–51)

## 2018-03-21 NOTE — ED Triage Notes (Signed)
Pt endorses abd pain with nausea, no vomiting since this morning, took an unprescribed vicodin that he bought off the street without relief. VSS.

## 2018-03-21 NOTE — ED Notes (Signed)
Results reviewed.  No changes in acuity at this time 

## 2018-03-22 MED ORDER — NAPROXEN 500 MG PO TABS
500.0000 mg | ORAL_TABLET | Freq: Two times a day (BID) | ORAL | 0 refills | Status: DC
Start: 2018-03-22 — End: 2018-09-27

## 2018-03-22 NOTE — Discharge Instructions (Signed)
Take Naproxen as prescribed. We recommend that you purchase Salonpas with lidocaine at your local pharmacy and apply to the affected area for additional pain control. Avoid strenuous activity and heavy lifting. Follow up with a primary care doctor.

## 2018-03-22 NOTE — ED Provider Notes (Signed)
[facility]ERGENC[facility] Provider Note   CSN: [id number] Arrival date & time: 03/21/18  1940     History   Chief Complaint Chief Complaint  Patient presents with  . Abdominal Pain    HPI Rochel BromeCameron R Westerman Jr. is a 32 y.o. male.   32 year old male presents to the emergency department for evaluation of left upper quadrant pain.  Pain is aggravated with position changes such as bending or twisting.  It has been present since this morning.  He does endorse frequent heavy lifting as he works for a [employer].  He is unaware of any specific injury that may have caused onset of his discomfort.  He took a tablet of Vicodin for pain without relief.  The patient does complain of some mild associated nausea.  He has had no fevers, vomiting, bowel changes, urinary symptoms.  No history of abdominal surgeries.     Past Medical History:  Diagnosis Date  . Eczema     There are no active problems to display for this patient.   History reviewed. No pertinent surgical history.      Home Medications    Prior to Admission medications   Medication Sig Start Date End Date Taking? Authorizing Provider  hydrOXYzine (ATARAX/VISTARIL) 25 MG tablet Take 1 tablet (25 mg total) by mouth every 6 (six) hours as needed for anxiety. Patient not taking: Reported on 03/21/2018 01/15/18   Arthor CaptainHarris, Abigail, PA-C  naproxen (NAPROSYN) 500 MG tablet Take 1 tablet (500 mg total) by mouth 2 (two) times daily with a meal. 03/22/18   Antony MaduraHumes, Fain Francis, PA-C    Family History History reviewed. No pertinent family history.  Social History Social History   Tobacco Use  . Smoking status: Current Every Day Smoker    Packs/day: 1.00    Types: Cigarettes  . Smokeless tobacco: Never Used  Substance Use Topics  . Alcohol use: Yes    Comment: twice a month  . Drug use: Yes    Types: Marijuana     Allergies   Patient has no known allergies.   Review of Systems Review of  Systems Ten systems reviewed and are negative for acute change, except as noted in the HPI.    Physical Exam Updated Vital Signs BP 121/84 (BP Location: Left Arm)   Pulse (!) 50   Temp 98.5 F (36.9 C) (Oral)   Resp 16   SpO2 100%   Physical Exam  Constitutional: He is oriented to person, place, and time. He appears well-developed and well-nourished. No distress.  Nontoxic appearing and in NAD  HENT:  Head: Normocephalic and atraumatic.  Eyes: Conjunctivae and EOM are normal. No scleral icterus.  Neck: Normal range of motion.  Cardiovascular: Normal rate, regular rhythm and intact distal pulses.  Pulmonary/Chest: Effort normal. No stridor. No respiratory distress. He has no wheezes. He has no rales.  Lungs CTAB. Respirations even and unlabored.  Abdominal: Soft. He exhibits no mass. There is tenderness (LUQ). There is no guarding.  Abdomen nondistended. No peritoneal signs.  Musculoskeletal: Normal range of motion.  Neurological: He is alert and oriented to person, place, and time. He exhibits normal muscle tone. Coordination normal.  Skin: Skin is warm and dry. No rash noted. He is not diaphoretic. No erythema. No pallor.  Psychiatric: He has a normal mood and affect. His behavior is normal.  Nursing note and vitals reviewed.    ED Treatments / Results  Labs (all labs ordered are listed, but only  abnormal results are displayed) Labs Reviewed  COMPREHENSIVE METABOLIC PANEL - Abnormal; Notable for the following components:      Result Value   Alkaline Phosphatase 136 (*)    All other components within normal limits  URINALYSIS, ROUTINE W REFLEX MICROSCOPIC - Abnormal; Notable for the following components:   Specific Gravity, Urine 1.036 (*)    All other components within normal limits  LIPASE, BLOOD  CBC    EKG None  Radiology No results found.  Procedures Procedures (including critical care time)  Medications Ordered in ED Medications - No data to  display   Initial Impression / Assessment and Plan / ED Course  I have reviewed the triage vital signs and the nursing notes.  Pertinent labs & imaging results that were available during my care of the patient were reviewed by me and considered in my medical decision making (see chart for details).     32 year old male presents to the emergency department for evaluation of left upper quadrant abdominal pain.  This is worse with position changes such as twisting or bending.  I appreciate that his pain is due to abdominal wall strain rather than acute intra-abdominal process.  He is afebrile with stable vital signs.  Laboratory work-up reassuring without leukocytosis or electrolyte derangements.  Liver and kidney function preserved.  Will continue with outpatient supportive care in addition to primary care follow-up.  Return precautions discussed and provided. Patient discharged in stable condition with no unaddressed concerns.   Final Clinical Impressions(s) / ED Diagnoses   Final diagnoses:  Abdominal wall pain in left upper quadrant    ED Discharge Orders        Ordered    naproxen (NAPROSYN) 500 MG tablet  2 times daily with meals     03/22/18 0209       Antony Madura, PA-C 03/22/18 2956    Dione Booze, MD 03/22/18 [phone]

## 2018-07-27 ENCOUNTER — Other Ambulatory Visit: Payer: Self-pay

## 2018-07-27 ENCOUNTER — Encounter (HOSPITAL_COMMUNITY): Payer: Self-pay

## 2018-07-27 ENCOUNTER — Emergency Department (HOSPITAL_COMMUNITY)
Admission: EM | Admit: 2018-07-27 | Discharge: 2018-07-28 | Disposition: A | Discharge status: Home / Self Care | Payer: Self-pay | Attending: Emergency Medicine | Admitting: Emergency Medicine

## 2018-07-27 DIAGNOSIS — S62664A Nondisplaced fracture of distal phalanx of right ring finger, initial encounter for closed fracture: Secondary | ICD-10-CM | POA: Insufficient documentation

## 2018-07-27 DIAGNOSIS — Y929 Unspecified place or not applicable: Secondary | ICD-10-CM | POA: Insufficient documentation

## 2018-07-27 DIAGNOSIS — L03113 Cellulitis of right upper limb: Secondary | ICD-10-CM | POA: Insufficient documentation

## 2018-07-27 DIAGNOSIS — F17210 Nicotine dependence, cigarettes, uncomplicated: Secondary | ICD-10-CM | POA: Insufficient documentation

## 2018-07-27 DIAGNOSIS — X500XXA Overexertion from strenuous movement or load, initial encounter: Secondary | ICD-10-CM | POA: Insufficient documentation

## 2018-07-27 DIAGNOSIS — Y999 Unspecified external cause status: Secondary | ICD-10-CM | POA: Insufficient documentation

## 2018-07-27 DIAGNOSIS — Y939 Activity, unspecified: Secondary | ICD-10-CM | POA: Insufficient documentation

## 2018-07-27 DIAGNOSIS — L0291 Cutaneous abscess, unspecified: Secondary | ICD-10-CM

## 2018-07-27 LAB — CBC WITH DIFFERENTIAL/PLATELET
Abs Immature Granulocytes: 0.02 10*3/uL (ref 0.00–0.07)
Basophils Absolute: 0 10*3/uL (ref 0.0–0.1)
Basophils Relative: 0 %
EOS ABS: 0.1 10*3/uL (ref 0.0–0.5)
Eosinophils Relative: 1 %
HEMATOCRIT: 36.5 % — AB (ref 39.0–52.0)
Hemoglobin: 11.9 g/dL — ABNORMAL LOW (ref 13.0–17.0)
IMMATURE GRANULOCYTES: 0 %
LYMPHS ABS: 1.2 10*3/uL (ref 0.7–4.0)
LYMPHS PCT: 17 %
MCH: 29.4 pg (ref 26.0–34.0)
MCHC: 32.6 g/dL (ref 30.0–36.0)
MCV: 90.1 fL (ref 80.0–100.0)
Monocytes Absolute: 0.7 10*3/uL (ref 0.1–1.0)
Monocytes Relative: 11 %
NRBC: 0 % (ref 0.0–0.2)
Neutro Abs: 4.8 10*3/uL (ref 1.7–7.7)
Neutrophils Relative %: 71 %
PLATELETS: 267 10*3/uL (ref 150–400)
RBC: 4.05 MIL/uL — AB (ref 4.22–5.81)
RDW: 12.6 % (ref 11.5–15.5)
WBC: 6.8 10*3/uL (ref 4.0–10.5)

## 2018-07-27 LAB — URINALYSIS, ROUTINE W REFLEX MICROSCOPIC
BILIRUBIN URINE: NEGATIVE
Glucose, UA: NEGATIVE mg/dL
HGB URINE DIPSTICK: NEGATIVE
Ketones, ur: NEGATIVE mg/dL
Leukocytes, UA: NEGATIVE
Nitrite: NEGATIVE
PROTEIN: NEGATIVE mg/dL
Specific Gravity, Urine: 1.021 (ref 1.005–1.030)
pH: 6 (ref 5.0–8.0)

## 2018-07-27 LAB — I-STAT CG4 LACTIC ACID, ED: Lactic Acid, Venous: 0.92 mmol/L (ref 0.5–1.9)

## 2018-07-27 LAB — PROTIME-INR
INR: 0.98
PROTHROMBIN TIME: 12.9 s (ref 11.4–15.2)

## 2018-07-27 NOTE — ED Triage Notes (Addendum)
Pt reports abscess on right upper arm for a few days, Yellow drainage, foul odor. Redness and swelling around the area. Pt also reports slamming his right ring finger while at work a few weeks ago. +ROM. Pt is IV drug user of heroin, last use was a few hours ago.

## 2018-07-28 ENCOUNTER — Emergency Department (HOSPITAL_COMMUNITY): Payer: Self-pay

## 2018-07-28 LAB — COMPREHENSIVE METABOLIC PANEL
ALT: 221 U/L — AB (ref 0–44)
AST: 127 U/L — ABNORMAL HIGH (ref 15–41)
Albumin: 3.6 g/dL (ref 3.5–5.0)
Alkaline Phosphatase: 215 U/L — ABNORMAL HIGH (ref 38–126)
Anion gap: 8 (ref 5–15)
BUN: 6 mg/dL (ref 6–20)
CALCIUM: 9 mg/dL (ref 8.9–10.3)
CHLORIDE: 99 mmol/L (ref 98–111)
CO2: 28 mmol/L (ref 22–32)
CREATININE: 0.59 mg/dL — AB (ref 0.61–1.24)
Glucose, Bld: 113 mg/dL — ABNORMAL HIGH (ref 70–99)
POTASSIUM: 3.6 mmol/L (ref 3.5–5.1)
SODIUM: 135 mmol/L (ref 135–145)
Total Bilirubin: 0.8 mg/dL (ref 0.3–1.2)
Total Protein: 6.7 g/dL (ref 6.5–8.1)

## 2018-07-28 LAB — I-STAT CG4 LACTIC ACID, ED: LACTIC ACID, VENOUS: 0.7 mmol/L (ref 0.5–1.9)

## 2018-07-28 MED ORDER — CEPHALEXIN 500 MG PO CAPS: 500.0000 mg | ORAL_CAPSULE | Freq: Four times a day (QID) | ORAL | 0 refills | Status: DC

## 2018-07-28 MED ORDER — LIDOCAINE-EPINEPHRINE-TETRACAINE (LET) SOLUTION
3.0000 mL | Freq: Once | NASAL | Status: AC
  Administered 2018-07-28: 3 mL via TOPICAL
  Filled 2018-07-28: qty 3

## 2018-07-28 MED ORDER — VANCOMYCIN HCL IN DEXTROSE 1-5 GM/200ML-% IV SOLN
1000.0000 mg | Freq: Once | INTRAVENOUS | Status: AC
  Administered 2018-07-28: 1000 mg via INTRAVENOUS
  Filled 2018-07-28: qty 200

## 2018-07-28 MED ORDER — SULFAMETHOXAZOLE-TRIMETHOPRIM 800-160 MG PO TABS: 1.0000 | ORAL_TABLET | Freq: Two times a day (BID) | ORAL | 0 refills | Status: AC

## 2018-07-28 NOTE — ED Notes (Signed)
To ultrasound

## 2018-07-28 NOTE — ED Provider Notes (Signed)
[facility] EMERGENCY DEPARTMENT Provider Note   CSN: [id number] Arrival date & time: 07/27/18  2259     History   Chief Complaint Chief Complaint  Patient presents with  . Abscess  . Finger Injury    HPI James Beard. is a 32 y.o. male.  HPI  This is a 32 year old male with a history of IV drug use who presents with abscess to the right upper arm.  Patient reports 3 to 4-day history of swelling and redness to the right upper arm.  Patient uses IV heroin and does shoot up in that arm.  He has noted since this morning yellow drainage that has been foul-smelling.  No fevers.  Patient also reports that he jammed his right ring finger at work several weeks ago.  He is having 6 out of 10 pain.  He has not taken anything for the symptoms.  He states "I think it is broken."  Past Medical History:  Diagnosis Date  . Eczema     There are no active problems to display for this patient.   History reviewed. No pertinent surgical history.      Home Medications    Prior to Admission medications   Medication Sig Start Date End Date Taking? Authorizing Provider  cephALEXin (KEFLEX) 500 MG capsule Take 1 capsule (500 mg total) by mouth 4 (four) times daily. 07/28/18   Marie Borowski, Mayer Masker, MD  hydrOXYzine (ATARAX/VISTARIL) 25 MG tablet Take 1 tablet (25 mg total) by mouth every 6 (six) hours as needed for anxiety. Patient not taking: Reported on 03/21/2018 01/15/18   Arthor Captain, PA-C  naproxen (NAPROSYN) 500 MG tablet Take 1 tablet (500 mg total) by mouth 2 (two) times daily with a meal. Patient not taking: Reported on 07/28/2018 03/22/18   Antony Madura, PA-C  sulfamethoxazole-trimethoprim (BACTRIM DS,SEPTRA DS) 800-160 MG tablet Take 1 tablet by mouth 2 (two) times daily for 7 days. 07/28/18 08/04/18  Shon Baton, MD    Family History No family history on file.  Social History Social History   Tobacco Use  . Smoking status: Current Every Day  Smoker    Packs/day: 1.00    Types: Cigarettes  . Smokeless tobacco: Never Used  Substance Use Topics  . Alcohol use: Not Currently    Comment: twice a month  . Drug use: Yes    Types: Cocaine, Marijuana, Methamphetamines, IV    Comment: heroin     Allergies   Patient has no known allergies.   Review of Systems Review of Systems  Constitutional: Negative for fever.  Respiratory: Negative for shortness of breath.   Cardiovascular: Negative for chest pain.  Musculoskeletal: Negative for back pain.       Finger pain  Skin: Positive for color change.       Abscess  All other systems reviewed and are negative.    Physical Exam Updated Vital Signs BP 137/80   Pulse 73   Temp 98.7 F (37.1 C) (Oral)   Resp 18   Ht 1.778 m (5\' 10" )   Wt 70 kg   SpO2 99%   BMI 22.14 kg/m   Physical Exam  Constitutional: He is oriented to person, place, and time. He appears well-developed and well-nourished. No distress.  HENT:  Head: Normocephalic and atraumatic.  Eyes: Pupils are equal, round, and reactive to light.  Cardiovascular: Normal rate, regular rhythm and normal heart sounds.  Pulmonary/Chest: Effort normal. No respiratory distress.  Musculoskeletal: He exhibits no  edema.  Normal range of motion of the right elbow, draining abscess noted just proximal to the elbow over the distal bicep, yellow purulent drainage noted No obvious deformity to the right ring finger, normal range of motion 2+ radial pulse  Neurological: He is alert and oriented to person, place, and time.  Skin: Skin is warm and dry.  Erythema noted to the upper extremity Track marks noted bilateral upper extremities  Psychiatric: He has a normal mood and affect.  Nursing note and vitals reviewed.    ED Treatments / Results  Labs (all labs ordered are listed, but only abnormal results are displayed) Labs Reviewed  COMPREHENSIVE METABOLIC PANEL - Abnormal; Notable for the following components:       Result Value   Glucose, Bld 113 (*)    Creatinine, Ser 0.59 (*)    AST 127 (*)    ALT 221 (*)    Alkaline Phosphatase 215 (*)    All other components within normal limits  CBC WITH DIFFERENTIAL/PLATELET - Abnormal; Notable for the following components:   RBC 4.05 (*)    Hemoglobin 11.9 (*)    HCT 36.5 (*)    All other components within normal limits  CULTURE, BLOOD (ROUTINE X 2)  CULTURE, BLOOD (ROUTINE X 2)  PROTIME-INR  URINALYSIS, ROUTINE W REFLEX MICROSCOPIC  I-STAT CG4 LACTIC ACID, ED  I-STAT CG4 LACTIC ACID, ED    EKG None  Radiology Dg Finger Ring Right  Result Date: 07/28/2018 CLINICAL DATA:  Patient is having pain in the anterior DIP joint after slamming it in a tool box three weeks ago. There is swelling in the area and a laceration near the lateral side of the PIP joint. EXAM: RIGHT RING FINGER 2+V COMPARISON:  None. FINDINGS: Comminuted dorsal plate fracture of the distal phalanx of the right fourth finger with anterior subluxation of the main fragment of the distal phalanx with respect to the middle phalanx. Soft tissue swelling. IMPRESSION: Comminuted dorsal plate fracture of the distal phalanx of the right fourth finger with anterior subluxation of the distal phalanx with respect to the middle phalanx. Electronically Signed   By: Burman Nieves M.D.   On: 07/28/2018 01:35    Procedures .Marland KitchenIncision and Drainage Date/Time: 07/28/2018 4:20 AM Performed by: Shon Baton, MD Authorized by: Shon Baton, MD   Consent:    Consent obtained:  Verbal   Consent given by:  Patient   Risks discussed:  Bleeding, incomplete drainage and pain   Alternatives discussed:  No treatment Location:    Type:  Abscess   Location:  Upper extremity   Upper extremity location:  Arm   Arm location:  R upper arm Pre-procedure details:    Skin preparation:  Betadine Anesthesia (see MAR for exact dosages):    Anesthesia method:  Topical application   Topical anesthetic:   LET Procedure type:    Complexity:  Simple Procedure details:    Incision types:  Stab incision and single straight   Incision depth:  Dermal   Scalpel blade:  11   Wound management:  Probed and deloculated and irrigated with saline   Drainage:  Purulent   Drainage amount:  Moderate   Wound treatment:  Wound left open   Packing materials:  1/4 in iodoform gauze Post-procedure details:    Patient tolerance of procedure:  Tolerated well, no immediate complications Comments:     Abscess actively draining, opening was extended with a linear incision using an 11 blade scalpel   (  including critical care time)  Medications Ordered in ED Medications  vancomycin (VANCOCIN) IVPB 1000 mg/200 mL premix (0 mg Intravenous Stopped 07/28/18 0315)  lidocaine-EPINEPHrine-tetracaine (LET) solution (3 mLs Topical Given 07/28/18 0229)     Initial Impression / Assessment and Plan / ED Course  I have reviewed the triage vital signs and the nursing notes.  Pertinent labs & imaging results that were available during my care of the patient were reviewed by me and considered in my medical decision making (see chart for details).     Patient presents with abscess and likely early cellulitis of the right upper extremity.  He is overall nontoxic-appearing and vital signs are reassuring.  He is afebrile.  History of polysubstance abuse and IV drug use.  Feel like this is likely the cause.  Abscess is spontaneously draining purulent material.  Additionally, patient reports injury to the right ring finger.  Lab work reviewed.  No significant leukocytosis.  No other laboratory derangements.  Patient was given IV vancomycin.  He does not appear septic.  An excision was made to extend the opening of the already draining abscess.  Moderate amount of purulent discharge was evacuated and abscess was irrigated with normal saline.  Packing was placed.  Patient was instructed that he needs to have recheck in 2 days as he has  high likelihood for progression and possible need for evaluation in the OR.  Patient will be placed on Bactrim and Keflex.  I discussed with the patient that the abscess is likely related to IV drug use.  He was given strict return precautions.  After history, exam, and medical workup I feel the patient has been appropriately medically screened and is safe for discharge home. Pertinent diagnoses were discussed with the patient. Patient was given return precautions.   Final Clinical Impressions(s) / ED Diagnoses   Final diagnoses:  Abscess  Cellulitis of right upper extremity  Closed nondisplaced fracture of distal phalanx of right ring finger, initial encounter    ED Discharge Orders         Ordered    sulfamethoxazole-trimethoprim (BACTRIM DS,SEPTRA DS) 800-160 MG tablet  2 times daily     07/28/18 0418    cephALEXin (KEFLEX) 500 MG capsule  4 times daily     07/28/18 0418           Vinayak Bobier, Mayer Maskerourtney F, MD 07/28/18 [phone]

## 2018-07-28 NOTE — Discharge Instructions (Addendum)
You were seen today for an abscess and cellulitis of the right upper extremity.  Keep packing in place.  Take antibiotics as instructed.  You need to have this rechecked in 2 days.  If you develop fevers, worsening redness, worsening swelling, you need to be reevaluated immediately.  He also have a fracture of your right ring finger.  Keep splinted.  Follow-up with orthopedics above.

## 2018-08-02 LAB — CULTURE, BLOOD (ROUTINE X 2)
Culture: NO GROWTH
Culture: NO GROWTH
SPECIAL REQUESTS: ADEQUATE

## 2018-08-08 ENCOUNTER — Other Ambulatory Visit: Payer: Self-pay

## 2018-08-08 ENCOUNTER — Emergency Department
Admission: EM | Admit: 2018-08-08 | Discharge: 2018-08-08 | Disposition: A | Discharge status: Home / Self Care | Payer: Self-pay | Attending: Student in an Organized Health Care Education/Training Program | Admitting: Student in an Organized Health Care Education/Training Program

## 2018-08-08 DIAGNOSIS — X58XXXD Exposure to other specified factors, subsequent encounter: Secondary | ICD-10-CM | POA: Insufficient documentation

## 2018-08-08 DIAGNOSIS — F1920 Other psychoactive substance dependence, uncomplicated: Secondary | ICD-10-CM | POA: Insufficient documentation

## 2018-08-08 DIAGNOSIS — S62632D Displaced fracture of distal phalanx of right middle finger, subsequent encounter for fracture with routine healing: Secondary | ICD-10-CM | POA: Insufficient documentation

## 2018-08-08 DIAGNOSIS — F1510 Other stimulant abuse, uncomplicated: Secondary | ICD-10-CM | POA: Insufficient documentation

## 2018-08-08 DIAGNOSIS — F1410 Cocaine abuse, uncomplicated: Secondary | ICD-10-CM | POA: Insufficient documentation

## 2018-08-08 DIAGNOSIS — F17210 Nicotine dependence, cigarettes, uncomplicated: Secondary | ICD-10-CM | POA: Insufficient documentation

## 2018-08-08 DIAGNOSIS — T1490XD Injury, unspecified, subsequent encounter: Secondary | ICD-10-CM | POA: Insufficient documentation

## 2018-08-08 DIAGNOSIS — F1210 Cannabis abuse, uncomplicated: Secondary | ICD-10-CM | POA: Insufficient documentation

## 2018-08-08 DIAGNOSIS — S62632A Displaced fracture of distal phalanx of right middle finger, initial encounter for closed fracture: Secondary | ICD-10-CM

## 2018-08-08 NOTE — Discharge Instructions (Addendum)
Your abscess appears to be healing.  Please wear your finger splint as prescribed so that your finger can heal.  Please make an appointment with orthopedics for further evaluation of your finger fracture.  Return to the emergency department for fever, chills, worsening pain to area of abscess, drainage from site, or any other symptoms concerning to you.

## 2018-08-08 NOTE — ED Triage Notes (Addendum)
Pt was seen at [facility] for injury to  right ring finger and abscess on right upper arm x2 weeks ago - he continues to c/o finger pain and abscess - abscess appears to be in healing stage (slightly red around the edges without drainage) - pt reports that he was at a drug rehab and they "kicked him out this morning" and took him to the homeless shelter

## 2018-08-08 NOTE — ED Notes (Signed)
FIRST NURSE NOTE:  Pt arrived via EMS from the homeless shelter with reports of small abscess to right bicep and broken right finger.

## 2018-08-08 NOTE — ED Notes (Signed)
Pt reports that drug rehab has bed bugs - no bed bugs observed at this time on pt or his belongings

## 2018-08-08 NOTE — ED Notes (Signed)
See triage note  States he is here because he needs some antibiotics  [facility]  States he only was able to take 3 days worth  And he lost the rest  States he feels like he needs to get another rx for same  Area to right upper arm appears to be healing   No drainage noted. min redness

## 2018-08-08 NOTE — ED Provider Notes (Signed)
[facility] Emergency Department Provider Note  ____________________________________________  Time seen: Approximately 8:13 AM  I have reviewed the triage vital signs and the nursing notes.   HISTORY  Chief Complaint Abscess and Hand Pain    HPI James Beard. is a 32 y.o. male presents emergency department for wound recheck to abscess of right upper arm.  Patient states that he went to [facility] 2 weeks ago and had an abscessed drain to his right upper forearm.  He states that he was only able to take 3 days of his Keflex and then it was thrown away on him.  He states that area feels much better than it did 2 weeks ago.  It is no longer painful.  It has not had any drainage.  No fevers.  He also states that he has had continued pain to a broken right middle finger.  He has not been wearing his splint because it bothers him when he is working.  No additional injuries.  He was at a drug rehabilitation center but was kicked out this morning and is not able to get into the homeless shelter until 9 AM.   Past Medical History:  Diagnosis Date  . Eczema     There are no active problems to display for this patient.   History reviewed. No pertinent surgical history.  Prior to Admission medications   Medication Sig Start Date End Date Taking? Authorizing Provider  cephALEXin (KEFLEX) 500 MG capsule Take 1 capsule (500 mg total) by mouth 4 (four) times daily. 07/28/18   Horton, Mayer Masker, MD  hydrOXYzine (ATARAX/VISTARIL) 25 MG tablet Take 1 tablet (25 mg total) by mouth every 6 (six) hours as needed for anxiety. Patient not taking: Reported on 03/21/2018 01/15/18   Arthor Captain, PA-C  naproxen (NAPROSYN) 500 MG tablet Take 1 tablet (500 mg total) by mouth 2 (two) times daily with a meal. Patient not taking: Reported on 07/28/2018 03/22/18   Antony Madura, PA-C    Allergies Patient has no known allergies.  No family history on file.  Social History Social  History   Tobacco Use  . Smoking status: Current Every Day Smoker    Packs/day: 1.00    Types: Cigarettes  . Smokeless tobacco: Never Used  Substance Use Topics  . Alcohol use: Not Currently    Comment: twice a month  . Drug use: Yes    Types: Cocaine, Marijuana, Methamphetamines, IV    Comment: heroin     Review of Systems  Constitutional: No fever/chills Gastrointestinal:  No nausea, no vomiting.  Musculoskeletal: Positive for finger pain. Skin: Negative for abrasions, lacerations, ecchymosis.  Positive for rash. Neurological: Negative for numbness or tingling   ____________________________________________   PHYSICAL EXAM:  VITAL SIGNS: ED Triage Vitals  Enc Vitals Group     BP 08/08/18 0745 130/84     Pulse Rate 08/08/18 0745 77     Resp 08/08/18 0745 15     Temp 08/08/18 0745 98.2 F (36.8 C)     Temp Source 08/08/18 0745 Oral     SpO2 08/08/18 0745 97 %     Weight 08/08/18 0743 140 lb (63.5 kg)     Height 08/08/18 0743 5\' 10"  (1.778 m)     Head Circumference --      Peak Flow --      Pain Score 08/08/18 0742 6     Pain Loc --      Pain Edu? --  Excl. in GC? --      Constitutional: Alert and oriented. Well appearing and in no acute distress. Eyes: Conjunctivae are normal. PERRL. EOMI. Head: Atraumatic. ENT:      Ears:      Nose: No congestion/rhinnorhea.      Mouth/Throat: Mucous membranes are moist.  Neck: No stridor.   Cardiovascular: Normal rate, regular rhythm.  Good peripheral circulation. Respiratory: Normal respiratory effort without tachypnea or retractions. Lungs CTAB. Good air entry to the bases with no decreased or absent breath sounds. Musculoskeletal: Full range of motion to all extremities. No gross deformities appreciated.  Swelling to right middle finger DIP.  Full range of motion of finger.  No splint in place. Neurologic:  Normal speech and language. No gross focal neurologic deficits are appreciated.  Skin:  Skin is warm, dry.   1/2 cm area of slight erythema with scab to right upper forearm.  No drainage.  No tenderness to palpation. Psychiatric: Mood and affect are normal. Speech and behavior are normal. Patient exhibits appropriate insight and judgement.   ____________________________________________   LABS (all labs ordered are listed, but only abnormal results are displayed)  Labs Reviewed - No data to display ____________________________________________  EKG   ____________________________________________  RADIOLOGY   No results found.  ____________________________________________    PROCEDURES  Procedure(s) performed:    Procedures    Medications - No data to display   ____________________________________________   INITIAL IMPRESSION / ASSESSMENT AND PLAN / ED COURSE  Pertinent labs & imaging results that were available during my care of the patient were reviewed by me and considered in my medical decision making (see chart for details).  Review of the [place] CSRS was performed in accordance of the [facility] prior to dispensing any controlled drugs.     Patient presents emergency department for evaluation of wound recheck.  No indication of current infection.  Wound appears to be healing well.  Patient denies any fever, tenderness to palpation, drainage.  Finger splint was reapplied to finger.  Patient was instructed to follow-up with orthopedics for finger. Patient is to follow up with orthopedics and primary care as directed.  He was given a booklet with resources to free and low-cost healthcare in [place].  Patient is given ED precautions to return to the ED for any worsening or new symptoms.     ____________________________________________  FINAL CLINICAL IMPRESSION(S) / ED DIAGNOSES  Final diagnoses:  Healing wound  Closed displaced fracture of distal phalanx of right middle finger, initial encounter      NEW MEDICATIONS STARTED DURING THIS VISIT:  ED Discharge  Orders    None          This chart was dictated using voice recognition software/Dragon. Despite best efforts to proofread, errors can occur which can change the meaning. Any change was purely unintentional.    Enid DerryWagner, Ahliya Glatt, PA-C 08/08/18 1410    Willy Eddyobinson, Patrick, MD 08/08/18 [phone]

## 2018-09-19 ENCOUNTER — Emergency Department (HOSPITAL_COMMUNITY): Payer: Self-pay

## 2018-09-19 ENCOUNTER — Emergency Department (HOSPITAL_COMMUNITY)
Admission: EM | Admit: 2018-09-19 | Discharge: 2018-09-19 | Disposition: A | Discharge status: Home / Self Care | Payer: Self-pay | Attending: Emergency Medicine | Admitting: Emergency Medicine

## 2018-09-19 ENCOUNTER — Encounter (HOSPITAL_COMMUNITY): Payer: Self-pay | Admitting: Internal Medicine

## 2018-09-19 DIAGNOSIS — Y999 Unspecified external cause status: Secondary | ICD-10-CM | POA: Insufficient documentation

## 2018-09-19 DIAGNOSIS — R1010 Upper abdominal pain, unspecified: Secondary | ICD-10-CM | POA: Insufficient documentation

## 2018-09-19 DIAGNOSIS — Y9355 Activity, bike riding: Secondary | ICD-10-CM | POA: Insufficient documentation

## 2018-09-19 DIAGNOSIS — S0083XA Contusion of other part of head, initial encounter: Secondary | ICD-10-CM

## 2018-09-19 DIAGNOSIS — Y929 Unspecified place or not applicable: Secondary | ICD-10-CM | POA: Insufficient documentation

## 2018-09-19 DIAGNOSIS — Z23 Encounter for immunization: Secondary | ICD-10-CM | POA: Insufficient documentation

## 2018-09-19 DIAGNOSIS — S0181XA Laceration without foreign body of other part of head, initial encounter: Secondary | ICD-10-CM

## 2018-09-19 DIAGNOSIS — S01112A Laceration without foreign body of left eyelid and periocular area, initial encounter: Secondary | ICD-10-CM | POA: Insufficient documentation

## 2018-09-19 LAB — COMPREHENSIVE METABOLIC PANEL
ALT: 53 U/L — ABNORMAL HIGH (ref 0–44)
AST: 37 U/L (ref 15–41)
Albumin: 3.5 g/dL (ref 3.5–5.0)
Alkaline Phosphatase: 137 U/L — ABNORMAL HIGH (ref 38–126)
Anion gap: 10 (ref 5–15)
BUN: <5 mg/dL — ABNORMAL LOW (ref 6–20)
CO2: 29 mmol/L (ref 22–32)
Calcium: 8.9 mg/dL (ref 8.9–10.3)
Chloride: 102 mmol/L (ref 98–111)
Creatinine, Ser: 0.89 mg/dL (ref 0.61–1.24)
GFR calc Af Amer: >60 mL/min (ref >60)
GFR calc non Af Amer: >60 mL/min (ref >60)
Glucose, Bld: 107 mg/dL — ABNORMAL HIGH (ref 70–99)
Potassium: 4 mmol/L (ref 3.5–5.1)
Sodium: 141 mmol/L (ref 135–145)
Total Bilirubin: 0.7 mg/dL (ref 0.3–1.2)
Total Protein: 6.5 g/dL (ref 6.5–8.1)

## 2018-09-19 LAB — CBC
HCT: 40.7 % (ref 39.0–52.0)
Hemoglobin: 13 g/dL (ref 13.0–17.0)
MCH: 29.3 pg (ref 26.0–34.0)
MCHC: 31.9 g/dL (ref 30.0–36.0)
MCV: 91.9 fL (ref 80.0–100.0)
Platelets: 269 10*3/uL (ref 150–400)
RBC: 4.43 MIL/uL (ref 4.22–5.81)
RDW: 13 % (ref 11.5–15.5)
WBC: 4.8 10*3/uL (ref 4.0–10.5)
nRBC: 0 % (ref 0.0–0.2)

## 2018-09-19 LAB — URINALYSIS, ROUTINE W REFLEX MICROSCOPIC
Bilirubin Urine: NEGATIVE
Glucose, UA: NEGATIVE mg/dL
Hgb urine dipstick: NEGATIVE
Ketones, ur: NEGATIVE mg/dL
Leukocytes, UA: NEGATIVE
Nitrite: NEGATIVE
Protein, ur: NEGATIVE mg/dL
Specific Gravity, Urine: >1.046 — ABNORMAL HIGH (ref 1.005–1.030)
pH: 7 (ref 5.0–8.0)

## 2018-09-19 LAB — CDS SEROLOGY

## 2018-09-19 LAB — I-STAT CHEM 8, ED
BUN: 4 mg/dL — ABNORMAL LOW (ref 6–20)
Calcium, Ion: 1.18 mmol/L (ref 1.15–1.40)
Chloride: 102 mmol/L (ref 98–111)
Creatinine, Ser: 0.7 mg/dL (ref 0.61–1.24)
Glucose, Bld: 113 mg/dL — ABNORMAL HIGH (ref 70–99)
HCT: 38 % — ABNORMAL LOW (ref 39.0–52.0)
Hemoglobin: 12.9 g/dL — ABNORMAL LOW (ref 13.0–17.0)
Potassium: 3.9 mmol/L (ref 3.5–5.1)
Sodium: 140 mmol/L (ref 135–145)
TCO2: 28 mmol/L (ref 22–32)

## 2018-09-19 LAB — PROTIME-INR
INR: 0.97
Prothrombin Time: 12.8 seconds (ref 11.4–15.2)

## 2018-09-19 LAB — SAMPLE TO BLOOD BANK

## 2018-09-19 LAB — RAPID HIV SCREEN (HIV 1/2 AB+AG)
HIV 1/2 Antibodies: NONREACTIVE
HIV-1 P24 Antigen - HIV24: NONREACTIVE

## 2018-09-19 LAB — ETHANOL: Alcohol, Ethyl (B): <10 mg/dL (ref <10)

## 2018-09-19 LAB — I-STAT CG4 LACTIC ACID, ED: Lactic Acid, Venous: 2.72 mmol/L (ref 0.5–1.9)

## 2018-09-19 MED ORDER — IOHEXOL 300 MG/ML  SOLN
100.0000 mL | Freq: Once | INTRAMUSCULAR | Status: AC | PRN
  Administered 2018-09-19: 100 mL via INTRAVENOUS

## 2018-09-19 MED ORDER — TETANUS-DIPHTH-ACELL PERTUSSIS 5-2.5-18.5 LF-MCG/0.5 IM SUSP
0.5000 mL | Freq: Once | INTRAMUSCULAR | Status: AC
  Administered 2018-09-19: 0.5 mL via INTRAMUSCULAR
  Filled 2018-09-19: qty 0.5

## 2018-09-19 MED ORDER — HYDROMORPHONE HCL 1 MG/ML IJ SOLN
1.0000 mg | Freq: Once | INTRAMUSCULAR | Status: AC
  Administered 2018-09-19: 1 mg via INTRAVENOUS
  Filled 2018-09-19: qty 1

## 2018-09-19 MED ORDER — LIDOCAINE-EPINEPHRINE (PF) 2 %-1:200000 IJ SOLN
20.0000 mL | Freq: Once | INTRAMUSCULAR | Status: AC
  Administered 2018-09-19: 20 mL
  Filled 2018-09-19: qty 20

## 2018-09-19 MED ORDER — ONDANSETRON HCL 4 MG/2ML IJ SOLN
4.0000 mg | Freq: Once | INTRAMUSCULAR | Status: AC
  Administered 2018-09-19: 4 mg via INTRAVENOUS
  Filled 2018-09-19: qty 2

## 2018-09-19 NOTE — ED Notes (Signed)
Pt given fluids and food
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
## 2018-09-19 NOTE — ED Provider Notes (Addendum)
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 type:    Repair type:  Intermediate Pre-procedure details:    Preparation:  Patient was prepped and draped in usual sterile fashion Exploration:    Wound exploration: wound explored through full range of motion and entire depth of wound probed and visualized   Treatment:    Area cleansed with:  Betadine   Amount of cleaning:  Standard   Irrigation solution:  Sterile saline Subcutaneous repair:    Suture size:  5-0   Suture material:  Vicryl   Number of sutures:  3 Skin repair:    Repair method:  Sutures   Suture size:  6-0   Suture material:  Prolene   Suture technique:  Simple interrupted   Number of sutures:  11 Post-procedure details:    Dressing:  Antibiotic ointment and sterile dressing   Patient tolerance of procedure:  Tolerated well, no immediate complications   (including critical care time)  Medications Ordered in ED Medications  lidocaine-EPINEPHrine (XYLOCAINE W/EPI) 2 %-1:200000 (PF) injection 20 mL (has no administration in time range)  Tdap (BOOSTRIX) injection 0.5 mL (has no administration in time range)     Initial Impression / Assessment and Plan / ED Course  I have reviewed the triage vital signs and the  nursing notes.  Pertinent labs & imaging results that were available during my care of the patient were reviewed by me and considered in my medical decision making (see chart for details).  Iv ns. Continuous pulse ox and monitor. Portable xrays. Stat labs sent.   Tetanus updated.   Moist sterile dressing to wound. cts pending.   Pt requests pain medication - dilaudid 1 mg iv. zofran iv.  Reviewed nursing notes and prior charts for additional history.   Labs reviewed - chem normal.  Imaging reviewed - no acute fx. degen changes. Discussed w pt.   Recheck spine - no focal/midline bony tenderness.   Wound sutured. Bacitracin and sterile dressing applied.   Po fluids, food.   Pt tolerating po. Ambulated in ED.   Pt currently appears stable for d/c.     Final Clinical Impressions(s) / ED Diagnoses   Final diagnoses:  None    ED Discharge Orders    None           Cathren Laine, MD 09/19/18 2056

## 2018-09-19 NOTE — ED Notes (Signed)
Patient verbalizes understanding of discharge instructions. Opportunity for questioning and answers were provided. Armband removed by staff, pt discharged from ED ambulatory to home.  
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
## 2018-09-19 NOTE — Discharge Instructions (Addendum)
It was our pleasure to provide your ER care today - we hope that you feel better.  Icepack to sore area. Keep wound very clean.   Have sutures removed, your doctor or urgent care, in 7-9 days.   Take acetaminophen and/or ibuprofen as need for pain.   Return to ER if worse, new symptoms, new or severe pain, infection of wound, other concern.   You were given pain medication in the ER - no driving for the next 4 hours.

## 2018-09-19 NOTE — ED Notes (Signed)
Portable xray @bedside

## 2018-09-19 NOTE — ED Notes (Signed)
Lidocaine at bedside.

## 2018-09-19 NOTE — Progress Notes (Signed)
Orthopedic Tech Progress Note Patient Details:  James Beard 03-31-1986 [id number]  Patient ID: [name], male   DOB: 04/16/1986, 33 y.o.   MRN: [id number]   Darden Dates KoontzLevel 2 Trauma. 09/19/2018, 4:38 PM

## 2018-09-21 LAB — HEPATITIS C ANTIBODY (REFLEX): HCV Ab: >11 s/co ratio — ABNORMAL HIGH (ref 0.0–0.9)

## 2018-09-21 LAB — HEPATITIS B SURFACE ANTIGEN: Hepatitis B Surface Ag: NEGATIVE

## 2018-09-26 ENCOUNTER — Emergency Department (HOSPITAL_COMMUNITY)
Admission: EM | Admit: 2018-09-26 | Discharge: 2018-09-26 | Disposition: A | Discharge status: Home / Self Care | Payer: Self-pay | Attending: Emergency Medicine | Admitting: Emergency Medicine

## 2018-09-26 ENCOUNTER — Other Ambulatory Visit: Payer: Self-pay

## 2018-09-26 ENCOUNTER — Encounter (HOSPITAL_COMMUNITY): Payer: Self-pay | Admitting: Emergency Medicine

## 2018-09-26 DIAGNOSIS — Z4802 Encounter for removal of sutures: Secondary | ICD-10-CM | POA: Insufficient documentation

## 2018-09-26 DIAGNOSIS — W228XXD Striking against or struck by other objects, subsequent encounter: Secondary | ICD-10-CM | POA: Insufficient documentation

## 2018-09-26 DIAGNOSIS — S01112D Laceration without foreign body of left eyelid and periocular area, subsequent encounter: Secondary | ICD-10-CM | POA: Insufficient documentation

## 2018-09-26 DIAGNOSIS — F17210 Nicotine dependence, cigarettes, uncomplicated: Secondary | ICD-10-CM | POA: Insufficient documentation

## 2018-09-26 NOTE — ED Triage Notes (Signed)
Pt reports he had stitches placed to L eye last Wednesday. Pt is here to have stitches removed.
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
## 2018-09-27 ENCOUNTER — Emergency Department (HOSPITAL_COMMUNITY)
Admission: EM | Admit: 2018-09-27 | Discharge: 2018-09-27 | Disposition: A | Discharge status: Home / Self Care | Payer: Self-pay | Attending: Emergency Medicine | Admitting: Emergency Medicine

## 2018-09-27 ENCOUNTER — Emergency Department (HOSPITAL_COMMUNITY): Payer: Self-pay

## 2018-09-27 ENCOUNTER — Encounter (HOSPITAL_COMMUNITY): Payer: Self-pay | Admitting: *Deleted

## 2018-09-27 ENCOUNTER — Other Ambulatory Visit: Payer: Self-pay

## 2018-09-27 DIAGNOSIS — F17210 Nicotine dependence, cigarettes, uncomplicated: Secondary | ICD-10-CM | POA: Insufficient documentation

## 2018-09-27 DIAGNOSIS — Y9355 Activity, bike riding: Secondary | ICD-10-CM | POA: Insufficient documentation

## 2018-09-27 DIAGNOSIS — S20211A Contusion of right front wall of thorax, initial encounter: Secondary | ICD-10-CM | POA: Insufficient documentation

## 2018-09-27 DIAGNOSIS — Y929 Unspecified place or not applicable: Secondary | ICD-10-CM | POA: Insufficient documentation

## 2018-09-27 DIAGNOSIS — Y999 Unspecified external cause status: Secondary | ICD-10-CM | POA: Insufficient documentation

## 2018-09-27 MED ORDER — NAPROXEN 250 MG PO TABS
500.0000 mg | ORAL_TABLET | Freq: Once | ORAL | Status: AC
  Administered 2018-09-27: 500 mg via ORAL
  Filled 2018-09-27: qty 2

## 2018-09-27 MED ORDER — NAPROXEN 500 MG PO TABS: 500.0000 mg | ORAL_TABLET | Freq: Two times a day (BID) | ORAL | 0 refills | Status: DC

## 2018-09-27 MED ORDER — LIDOCAINE 5 % EX PTCH
1.0000 | MEDICATED_PATCH | CUTANEOUS | Status: DC
  Administered 2018-09-27: 1 via TRANSDERMAL
  Filled 2018-09-27: qty 1
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
## 2018-09-27 NOTE — Discharge Instructions (Addendum)
Alternate ice and heat to areas of injury 3-4 times per day to limit inflammation and spasm.  Use naproxen as prescribed.  We recommend follow-up with a primary care doctor to ensure resolution of symptoms.  Return to the ED for any new or concerning symptoms.

## 2018-09-28 ENCOUNTER — Emergency Department (HOSPITAL_COMMUNITY)
Admission: EM | Admit: 2018-09-28 | Discharge: 2018-09-29 | Disposition: A | Discharge status: Home / Self Care | Payer: Self-pay | Attending: Emergency Medicine | Admitting: Emergency Medicine

## 2018-09-28 ENCOUNTER — Other Ambulatory Visit: Payer: Self-pay

## 2018-09-28 ENCOUNTER — Encounter (HOSPITAL_COMMUNITY): Payer: Self-pay | Admitting: Emergency Medicine

## 2018-09-28 DIAGNOSIS — R45851 Suicidal ideations: Secondary | ICD-10-CM | POA: Insufficient documentation

## 2018-09-28 DIAGNOSIS — R1084 Generalized abdominal pain: Secondary | ICD-10-CM | POA: Insufficient documentation

## 2018-09-28 DIAGNOSIS — F1110 Opioid abuse, uncomplicated: Secondary | ICD-10-CM

## 2018-09-28 DIAGNOSIS — F323 Major depressive disorder, single episode, severe with psychotic features: Secondary | ICD-10-CM | POA: Insufficient documentation

## 2018-09-28 DIAGNOSIS — F1199 Opioid use, unspecified with unspecified opioid-induced disorder: Secondary | ICD-10-CM | POA: Diagnosis present

## 2018-09-28 DIAGNOSIS — F1910 Other psychoactive substance abuse, uncomplicated: Secondary | ICD-10-CM

## 2018-09-28 DIAGNOSIS — F17210 Nicotine dependence, cigarettes, uncomplicated: Secondary | ICD-10-CM | POA: Insufficient documentation

## 2018-09-28 DIAGNOSIS — F1193 Opioid use, unspecified with withdrawal: Secondary | ICD-10-CM | POA: Insufficient documentation

## 2018-09-28 DIAGNOSIS — F1994 Other psychoactive substance use, unspecified with psychoactive substance-induced mood disorder: Secondary | ICD-10-CM | POA: Diagnosis present

## 2018-09-28 DIAGNOSIS — F1190 Opioid use, unspecified, uncomplicated: Secondary | ICD-10-CM | POA: Diagnosis present

## 2018-09-28 DIAGNOSIS — F1123 Opioid dependence with withdrawal: Secondary | ICD-10-CM

## 2018-09-28 LAB — CBC
HCT: 37.5 % — ABNORMAL LOW (ref 39.0–52.0)
Hemoglobin: 11.9 g/dL — ABNORMAL LOW (ref 13.0–17.0)
MCH: 30.4 pg (ref 26.0–34.0)
MCHC: 31.7 g/dL (ref 30.0–36.0)
MCV: 95.9 fL (ref 80.0–100.0)
NRBC: 0 % (ref 0.0–0.2)
PLATELETS: 243 10*3/uL (ref 150–400)
RBC: 3.91 MIL/uL — AB (ref 4.22–5.81)
RDW: 13.4 % (ref 11.5–15.5)
WBC: 5.4 10*3/uL (ref 4.0–10.5)
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
## 2018-09-28 NOTE — ED Triage Notes (Signed)
Patient states he wants to kill himself. Patient was brought in by [facility].

## 2018-09-29 ENCOUNTER — Encounter (HOSPITAL_COMMUNITY): Payer: Self-pay

## 2018-09-29 ENCOUNTER — Inpatient Hospital Stay (HOSPITAL_COMMUNITY)
Admission: AD | Admit: 2018-09-29 | Discharge: 2018-10-02 | DRG: 897 | Disposition: A | Discharge status: Home / Self Care | Payer: Federal, State, Local not specified - Other | Source: Intra-hospital | Attending: Psychiatry | Admitting: Psychiatry

## 2018-09-29 DIAGNOSIS — Z62810 Personal history of physical and sexual abuse in childhood: Secondary | ICD-10-CM | POA: Diagnosis present

## 2018-09-29 DIAGNOSIS — Z590 Homelessness: Secondary | ICD-10-CM | POA: Diagnosis not present

## 2018-09-29 DIAGNOSIS — F332 Major depressive disorder, recurrent severe without psychotic features: Secondary | ICD-10-CM | POA: Diagnosis not present

## 2018-09-29 DIAGNOSIS — Z560 Unemployment, unspecified: Secondary | ICD-10-CM

## 2018-09-29 DIAGNOSIS — F1199 Opioid use, unspecified with unspecified opioid-induced disorder: Secondary | ICD-10-CM | POA: Diagnosis present

## 2018-09-29 DIAGNOSIS — F1114 Opioid abuse with opioid-induced mood disorder: Secondary | ICD-10-CM | POA: Diagnosis present

## 2018-09-29 DIAGNOSIS — F17210 Nicotine dependence, cigarettes, uncomplicated: Secondary | ICD-10-CM | POA: Diagnosis present

## 2018-09-29 DIAGNOSIS — Z23 Encounter for immunization: Secondary | ICD-10-CM

## 2018-09-29 DIAGNOSIS — R45850 Homicidal ideations: Secondary | ICD-10-CM | POA: Diagnosis present

## 2018-09-29 DIAGNOSIS — R45851 Suicidal ideations: Secondary | ICD-10-CM | POA: Diagnosis present

## 2018-09-29 DIAGNOSIS — F1124 Opioid dependence with opioid-induced mood disorder: Principal | ICD-10-CM | POA: Diagnosis present

## 2018-09-29 DIAGNOSIS — F1994 Other psychoactive substance use, unspecified with psychoactive substance-induced mood disorder: Secondary | ICD-10-CM | POA: Diagnosis present

## 2018-09-29 DIAGNOSIS — Z915 Personal history of self-harm: Secondary | ICD-10-CM

## 2018-09-29 DIAGNOSIS — F1190 Opioid use, unspecified, uncomplicated: Secondary | ICD-10-CM | POA: Diagnosis present

## 2018-09-29 DIAGNOSIS — I10 Essential (primary) hypertension: Secondary | ICD-10-CM | POA: Diagnosis present

## 2018-09-29 DIAGNOSIS — F1510 Other stimulant abuse, uncomplicated: Secondary | ICD-10-CM | POA: Diagnosis not present

## 2018-09-29 DIAGNOSIS — Z791 Long term (current) use of non-steroidal anti-inflammatories (NSAID): Secondary | ICD-10-CM

## 2018-09-29 DIAGNOSIS — G4700 Insomnia, unspecified: Secondary | ICD-10-CM | POA: Diagnosis present

## 2018-09-29 DIAGNOSIS — F1123 Opioid dependence with withdrawal: Secondary | ICD-10-CM | POA: Diagnosis present

## 2018-09-29 DIAGNOSIS — Z7982 Long term (current) use of aspirin: Secondary | ICD-10-CM | POA: Diagnosis not present

## 2018-09-29 LAB — COMPREHENSIVE METABOLIC PANEL
ALK PHOS: 127 U/L — AB (ref 38–126)
ALT: 24 U/L (ref 0–44)
ANION GAP: 4 — AB (ref 5–15)
AST: 29 U/L (ref 15–41)
Albumin: 3.5 g/dL (ref 3.5–5.0)
BUN: 12 mg/dL (ref 6–20)
CALCIUM: 8.3 mg/dL — AB (ref 8.9–10.3)
CHLORIDE: 104 mmol/L (ref 98–111)
CO2: 30 mmol/L (ref 22–32)
Creatinine, Ser: 0.61 mg/dL (ref 0.61–1.24)
GFR calc non Af Amer: >60 mL/min (ref >60)
Glucose, Bld: 87 mg/dL (ref 70–99)
Potassium: 3.5 mmol/L (ref 3.5–5.1)
SODIUM: 138 mmol/L (ref 135–145)
Total Bilirubin: 0.5 mg/dL (ref 0.3–1.2)
Total Protein: 6.6 g/dL (ref 6.5–8.1)

## 2018-09-29 LAB — RAPID URINE DRUG SCREEN, HOSP PERFORMED
Amphetamines: NOT DETECTED
BARBITURATES: NOT DETECTED
Benzodiazepines: NOT DETECTED
Cocaine: NOT DETECTED
Opiates: POSITIVE — AB
TETRAHYDROCANNABINOL: NOT DETECTED

## 2018-09-29 LAB — ETHANOL

## 2018-09-29 LAB — SALICYLATE LEVEL: Salicylate Lvl: <7 mg/dL (ref 2.8–30.0)

## 2018-09-29 LAB — ACETAMINOPHEN LEVEL: Acetaminophen (Tylenol), Serum: <10 ug/mL — ABNORMAL LOW (ref 10–30)

## 2018-09-29 MED ORDER — HYDROXYZINE HCL 25 MG PO TABS
ORAL_TABLET | ORAL | Status: AC
  Filled 2018-09-29: qty 1

## 2018-09-29 MED ORDER — NAPROXEN 500 MG PO TABS
500.0000 mg | ORAL_TABLET | Freq: Two times a day (BID) | ORAL | Status: DC | PRN
  Administered 2018-09-29 (×2): 500 mg via ORAL
  Filled 2018-09-29 (×2): qty 1

## 2018-09-29 MED ORDER — MAGNESIUM HYDROXIDE 400 MG/5ML PO SUSP: 30.0000 mL | Freq: Every day | ORAL | Status: DC | PRN

## 2018-09-29 MED ORDER — ALUM & MAG HYDROXIDE-SIMETH 200-200-20 MG/5ML PO SUSP: 30.0000 mL | ORAL | Status: DC | PRN

## 2018-09-29 MED ORDER — CLONIDINE HCL 0.1 MG PO TABS: 0.1000 mg | ORAL_TABLET | Freq: Two times a day (BID) | ORAL | Status: DC

## 2018-09-29 MED ORDER — LOPERAMIDE HCL 2 MG PO CAPS: 2.0000 mg | ORAL_CAPSULE | ORAL | Status: DC | PRN

## 2018-09-29 MED ORDER — HYDROXYZINE HCL 25 MG PO TABS
25.0000 mg | ORAL_TABLET | Freq: Four times a day (QID) | ORAL | Status: DC | PRN
  Administered 2018-09-29: 25 mg via ORAL
  Filled 2018-09-29: qty 1

## 2018-09-29 MED ORDER — DICYCLOMINE HCL 20 MG PO TABS
20.0000 mg | ORAL_TABLET | Freq: Four times a day (QID) | ORAL | Status: DC | PRN
  Administered 2018-09-29: 20 mg via ORAL
  Filled 2018-09-29: qty 1

## 2018-09-29 MED ORDER — NAPROXEN 500 MG PO TABS
500.0000 mg | ORAL_TABLET | Freq: Two times a day (BID) | ORAL | Status: DC | PRN
  Administered 2018-09-29 – 2018-10-01 (×5): 500 mg via ORAL
  Filled 2018-09-29 (×4): qty 1

## 2018-09-29 MED ORDER — CLONIDINE HCL 0.1 MG PO TABS
0.1000 mg | ORAL_TABLET | Freq: Four times a day (QID) | ORAL | Status: DC
  Administered 2018-09-29 (×2): 0.1 mg via ORAL
  Filled 2018-09-29 (×2): qty 1

## 2018-09-29 MED ORDER — CLONIDINE HCL 0.1 MG PO TABS: 0.1000 mg | ORAL_TABLET | Freq: Every day | ORAL | Status: DC

## 2018-09-29 MED ORDER — ONDANSETRON 4 MG PO TBDP
4.0000 mg | ORAL_TABLET | Freq: Four times a day (QID) | ORAL | Status: DC | PRN
  Administered 2018-09-29: 4 mg via ORAL
  Filled 2018-09-29: qty 1

## 2018-09-29 MED ORDER — CLONIDINE HCL 0.1 MG PO TABS
0.1000 mg | ORAL_TABLET | Freq: Four times a day (QID) | ORAL | Status: AC
  Administered 2018-09-29 – 2018-10-01 (×6): 0.1 mg via ORAL
  Filled 2018-09-29 (×10): qty 1

## 2018-09-29 MED ORDER — PNEUMOCOCCAL VAC POLYVALENT 25 MCG/0.5ML IJ INJ
0.5000 mL | INJECTION | INTRAMUSCULAR | Status: AC
  Administered 2018-10-01: 0.5 mL via INTRAMUSCULAR

## 2018-09-29 MED ORDER — ACETAMINOPHEN 325 MG PO TABS
650.0000 mg | ORAL_TABLET | Freq: Four times a day (QID) | ORAL | Status: DC | PRN
  Administered 2018-09-30: 650 mg via ORAL
  Filled 2018-09-29: qty 2

## 2018-09-29 MED ORDER — TRAZODONE HCL 50 MG PO TABS
50.0000 mg | ORAL_TABLET | Freq: Every evening | ORAL | Status: DC | PRN
  Administered 2018-09-29 – 2018-10-01 (×4): 50 mg via ORAL
  Filled 2018-09-29 (×4): qty 1

## 2018-09-29 MED ORDER — METHOCARBAMOL 500 MG PO TABS
500.0000 mg | ORAL_TABLET | Freq: Three times a day (TID) | ORAL | Status: DC | PRN
  Administered 2018-09-29 – 2018-10-01 (×4): 500 mg via ORAL
  Filled 2018-09-29 (×3): qty 1

## 2018-09-29 MED ORDER — ONDANSETRON 4 MG PO TBDP: 4.0000 mg | ORAL_TABLET | Freq: Four times a day (QID) | ORAL | Status: DC | PRN

## 2018-09-29 MED ORDER — INFLUENZA VAC SPLIT QUAD 0.5 ML IM SUSY
0.5000 mL | PREFILLED_SYRINGE | INTRAMUSCULAR | Status: AC
  Administered 2018-10-01: 0.5 mL via INTRAMUSCULAR
  Filled 2018-09-29: qty 0.5

## 2018-09-29 MED ORDER — METHOCARBAMOL 500 MG PO TABS
ORAL_TABLET | ORAL | Status: AC
  Filled 2018-09-29: qty 1

## 2018-09-29 MED ORDER — CLONIDINE HCL 0.1 MG PO TABS
0.1000 mg | ORAL_TABLET | Freq: Two times a day (BID) | ORAL | Status: DC
  Filled 2018-09-29 (×2): qty 1

## 2018-09-29 MED ORDER — NICOTINE 21 MG/24HR TD PT24
21.0000 mg | MEDICATED_PATCH | Freq: Every day | TRANSDERMAL | Status: DC
  Administered 2018-09-29 – 2018-10-02 (×4): 21 mg via TRANSDERMAL
  Filled 2018-09-29 (×6): qty 1

## 2018-09-29 MED ORDER — METHOCARBAMOL 500 MG PO TABS
500.0000 mg | ORAL_TABLET | Freq: Three times a day (TID) | ORAL | Status: DC | PRN
  Administered 2018-09-29: 500 mg via ORAL
  Filled 2018-09-29: qty 1

## 2018-09-29 MED ORDER — DICYCLOMINE HCL 20 MG PO TABS: 20.0000 mg | ORAL_TABLET | Freq: Four times a day (QID) | ORAL | Status: DC | PRN

## 2018-09-29 MED ORDER — HYDROXYZINE HCL 25 MG PO TABS
25.0000 mg | ORAL_TABLET | Freq: Four times a day (QID) | ORAL | Status: DC | PRN
  Administered 2018-09-29 – 2018-10-01 (×3): 25 mg via ORAL
  Filled 2018-09-29 (×2): qty 1
  Filled 2018-09-29: qty 10

## 2018-09-29 MED ORDER — NAPROXEN 500 MG PO TABS
ORAL_TABLET | ORAL | Status: AC
  Filled 2018-09-29: qty 1
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
## 2018-09-29 NOTE — ED Provider Notes (Signed)
[facility]-EMERGENCY DEPT Provider Note   CSN: [id number] Arrival date & time: 09/28/18  2316     History   Chief Complaint Chief Complaint  Patient presents with  . Suicidal    HPI Rochel BromeCameron R Recine Jr. is a 33 y.o. male with a hx of polysubstance abuse, eczema, suicidal ideation and self-harm presents to the Emergency Department complaining of gradual, persistent, progressively worsening suicidal ideation present for his whole life.  Patient reports he wants to kill himself because his family does not like him anymore.  Patient reports that tonight he made a plan to hang himself but came to the emergency department instead.  He reports last heroin injection was around 5 PM.  He denies homicidal ideation, auditory or visual hallucinations.  He denies headache, chest pain, shortness of breath, weakness, dizziness, syncope.  Patient reports he is withdrawing off of heroin.  He states he has some generalized abdominal pain and nausea.  He reports he has not yet vomited.  The history is provided by the patient and medical records. No language interpreter was used.    Past Medical History:  Diagnosis Date  . Eczema     There are no active problems to display for this patient.   History reviewed. No pertinent surgical history.      Home Medications    Prior to Admission medications   Medication Sig Start Date End Date Taking? Authorizing Provider  aspirin EC 325 MG tablet Take 325 mg by mouth 4 (four) times daily as needed (for pain).   Yes [provider]  ibuprofen (ADVIL,MOTRIN) 200 MG tablet Take 800 mg by mouth every 6 (six) hours as needed (for pain).   Yes [provider]  cephALEXin (KEFLEX) 500 MG capsule Take 1 capsule (500 mg total) by mouth 4 (four) times daily. Patient not taking: Reported on 09/26/2018 07/28/18   Horton, Mayer Maskerourtney F, MD  hydrOXYzine (ATARAX/VISTARIL) 25 MG tablet Take 1 tablet (25 mg total) by mouth every 6  (six) hours as needed for anxiety. Patient not taking: Reported on 09/26/2018 01/15/18   Arthor CaptainHarris, Abigail, PA-C  naproxen (NAPROSYN) 500 MG tablet Take 1 tablet (500 mg total) by mouth 2 (two) times daily. 09/27/18   Antony MaduraHumes, Kelly, PA-C    Family History History reviewed. No pertinent family history.  Social History Social History   Tobacco Use  . Smoking status: Current Every Day Smoker    Packs/day: 1.00    Types: Cigarettes  . Smokeless tobacco: Never Used  Substance Use Topics  . Alcohol use: Not Currently    Comment: twice a month  . Drug use: Yes    Types: Cocaine, Marijuana, Methamphetamines, IV    Comment: heroin     Allergies   Patient has no known allergies.   Review of Systems Review of Systems  Constitutional: Negative for appetite change, diaphoresis, fatigue, fever and unexpected weight change.  HENT: Negative for mouth sores.   Eyes: Negative for visual disturbance.  Respiratory: Negative for cough, chest tightness, shortness of breath and wheezing.   Cardiovascular: Negative for chest pain.  Gastrointestinal: Positive for abdominal pain and nausea. Negative for constipation, diarrhea and vomiting.  Endocrine: Negative for polydipsia, polyphagia and polyuria.  Genitourinary: Negative for dysuria, frequency, hematuria and urgency.  Musculoskeletal: Negative for back pain and neck stiffness.  Skin: Negative for rash.  Allergic/Immunologic: Negative for immunocompromised state.  Neurological: Negative for syncope, light-headedness and headaches.  Hematological: Does not bruise/bleed easily.  Psychiatric/Behavioral: Positive for  suicidal ideas. Negative for sleep disturbance. The patient is not nervous/anxious.      Physical Exam Updated Vital Signs BP 117/81 (BP Location: Right Arm)   Pulse 78   Temp 99.2 F (37.3 C) (Oral)   Resp 16   Ht 5\' 9"  (1.753 m)   Wt 72.6 kg   SpO2 98%   BMI 23.64 kg/m   Physical Exam Vitals signs and nursing note  reviewed.  Constitutional:      General: He is not in acute distress.    Appearance: He is well-developed. He is diaphoretic.     Comments: Awake, alert, nontoxic appearance  HENT:     Head: Normocephalic and atraumatic.     Mouth/Throat:     Pharynx: No oropharyngeal exudate.  Eyes:     General: No scleral icterus.    Conjunctiva/sclera: Conjunctivae normal.  Neck:     Musculoskeletal: Normal range of motion and neck supple.  Cardiovascular:     Rate and Rhythm: Regular rhythm. Tachycardia present.  Pulmonary:     Effort: Pulmonary effort is normal. No respiratory distress.     Breath sounds: Normal breath sounds. No wheezing.  Abdominal:     General: Bowel sounds are normal.     Palpations: Abdomen is soft. There is no mass.     Tenderness: There is generalized abdominal tenderness. There is no right CVA tenderness, left CVA tenderness, guarding or rebound.  Musculoskeletal: Normal range of motion.  Skin:    General: Skin is warm.  Neurological:     Mental Status: He is alert.     Comments: Speech is clear and goal oriented Moves extremities without ataxia      ED Treatments / Results  Labs (all labs ordered are listed, but only abnormal results are displayed) Labs Reviewed  COMPREHENSIVE METABOLIC PANEL - Abnormal; Notable for the following components:      Result Value   Calcium 8.3 (*)    Alkaline Phosphatase 127 (*)    Anion gap 4 (*)    All other components within normal limits  ACETAMINOPHEN LEVEL - Abnormal; Notable for the following components:   Acetaminophen (Tylenol), Serum <10 (*)    All other components within normal limits  CBC - Abnormal; Notable for the following components:   RBC 3.91 (*)    Hemoglobin 11.9 (*)    HCT 37.5 (*)    All other components within normal limits  RAPID URINE DRUG SCREEN, HOSP PERFORMED - Abnormal; Notable for the following components:   Opiates POSITIVE (*)    All other components within normal limits  ETHANOL    SALICYLATE LEVEL    Radiology Dg Ribs Unilateral W/chest Right  Result Date: 09/27/2018 CLINICAL DATA:  Right-sided rib pain EXAM: RIGHT RIBS AND CHEST - 3+ VIEW COMPARISON:  None. FINDINGS: Single-view chest demonstrates no acute consolidation or effusion. Normal heart size. No pneumothorax. Right rib series demonstrates old appearing right fifth and sixth rib fractures. IMPRESSION: 1. Negative for pneumothorax or pleural effusion. 2. No acute displaced right rib fracture Electronically Signed   By: Jasmine Pang M.D.   On: 09/27/2018 21:34    Procedures Procedures (including critical care time)  Medications Ordered in ED Medications  cloNIDine (CATAPRES) tablet 0.1 mg (has no administration in time range)    Followed by  cloNIDine (CATAPRES) tablet 0.1 mg (has no administration in time range)    Followed by  cloNIDine (CATAPRES) tablet 0.1 mg (has no administration in time range)  dicyclomine (BENTYL)  tablet 20 mg (has no administration in time range)  hydrOXYzine (ATARAX/VISTARIL) tablet 25 mg (has no administration in time range)  loperamide (IMODIUM) capsule 2-4 mg (has no administration in time range)  methocarbamol (ROBAXIN) tablet 500 mg (has no administration in time range)  naproxen (NAPROSYN) tablet 500 mg (has no administration in time range)  ondansetron (ZOFRAN-ODT) disintegrating tablet 4 mg (has no administration in time range)     Initial Impression / Assessment and Plan / ED Course  I have reviewed the triage vital signs and the nursing notes.  Pertinent labs & imaging results that were available during my care of the patient were reviewed by me and considered in my medical decision making (see chart for details).  Clinical Course as of Sep 29 224  Sat Sep 29, 2018  0226 Discussed with TTS who recommends overnight obs and reassessment by Psyc in the AM.     [HM]    Clinical Course User Index [HM] Anina Schnake, Dahlia ClientHannah, PA-C    Patient presents with  suicidal ideation with a plan to hang himself.  He has a history of suicidal ideation and suicide attempt.  Patient also with polysubstance abuse and recent heroin usage.  Patient exhibiting signs of opiate withdrawal.  He has abdominal pain, nausea is diaphoretic and tachycardic.  Labs are otherwise reassuring.  Will clear for TTS and give withdrawal medications.  2:27 AM Will have overnight observation and reevaluation in the morning by TTS.  At this time there remains no emergency for which intervention is required.   Final Clinical Impressions(s) / ED Diagnoses   Final diagnoses:  Suicidal ideation  Polysubstance abuse (HCC)  Opiate withdrawal [facility](HCC)    ED Discharge Orders    None       Mardene SayerMuthersbaugh, Boyd KerbsHannah, PA-C 09/29/18 0227    Molpus, Jonny RuizJohn, MD 09/29/18 0730

## 2018-09-29 NOTE — Progress Notes (Signed)
The patient attended the evening A.A.meeting without any difficulty.
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
## 2018-09-29 NOTE — ED Notes (Signed)
I have just been notified by our peer support person that we are endeavoring to get him a bed at [facility]. "in the 300 hall".
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
## 2018-09-29 NOTE — Progress Notes (Signed)
Pt transferred from TCU to [facility] for continuation of care related to depression and SI. Presents guarded with depressed affect and mood. Pt reports being depressed "I lost my grandfather last week, I can't go to the funeral, my family wants nothing to do with me and the funeral is today". Pt endorsed passive SI with plan to OD on heroin, +CAH to kill self and others, states "I hear voices all the time telling me to kill myself and others; I also see shadowy people". Pt calm at present. Cooperative with care. Pending placement for inpatient services at this time. Q 15 minutes safety checks maintained without self harm gestures or outburst.

## 2018-09-29 NOTE — Progress Notes (Signed)
Patient feels hopeless, helpless, and worthless.  "I'm a piece of shit".  His grandfather's funeral is today and he is not allowed to go because his family is "done with him."  Plans to overdose on heroin and end his "miserable life."  No previous rehabs/detox or inpatient hospitalization.   Nanine Means, PMHNP

## 2018-09-29 NOTE — Tx Team (Signed)
Initial Treatment Plan 09/29/2018 7:42 PM [name] [id number]    PATIENT STRESSORS: Financial difficulties Health problems Substance abuse Traumatic event    PATIENT STRENGTHS: Average or above average intelligence Capable of independent living Motivation for treatment/growth Physical Health Supportive family/friends   PATIENT IDENTIFIED PROBLEMS: "my depression/grieving of grandfather"  "getting clean"                   DISCHARGE CRITERIA:  Ability to meet basic life and health needs Adequate post-discharge living arrangements Improved stabilization in mood, thinking, and/or behavior  PRELIMINARY DISCHARGE PLAN: Attend 12-step recovery group Outpatient therapy Placement in alternative living arrangements  PATIENT/FAMILY INVOLVEMENT: This treatment plan has been presented to and reviewed with the patient, James Beard..  The patient and family have been given the opportunity to ask questions and make suggestions.  Raylene Miyamoto, RN 09/29/2018, 7:42 PM

## 2018-09-29 NOTE — Progress Notes (Signed)
EDP Muthersbaugh, Dahlia Client, PA-C has been advised of the disposition.
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
## 2018-09-30 DIAGNOSIS — F332 Major depressive disorder, recurrent severe without psychotic features: Secondary | ICD-10-CM

## 2018-09-30 DIAGNOSIS — F1510 Other stimulant abuse, uncomplicated: Secondary | ICD-10-CM

## 2018-09-30 MED ORDER — SERTRALINE HCL 50 MG PO TABS
50.0000 mg | ORAL_TABLET | Freq: Every day | ORAL | Status: DC
  Administered 2018-10-01: 50 mg via ORAL
  Filled 2018-09-30 (×2): qty 1

## 2018-09-30 MED ORDER — OLANZAPINE 2.5 MG PO TABS
2.5000 mg | ORAL_TABLET | Freq: Every day | ORAL | Status: DC
  Administered 2018-09-30 – 2018-10-01 (×2): 2.5 mg via ORAL
  Filled 2018-09-30: qty 1
  Filled 2018-09-30: qty 7
  Filled 2018-09-30 (×3): qty 1
  Filled 2018-09-30: qty 7

## 2018-09-30 NOTE — [facility] Group Notes (Signed)
[facility] Group Notes:  (Nursing)  Date:  09/30/2018  Time: 200 PM Type of Therapy:  Nurse Education  Participation Level:  Did Not Attend   Shela Nevin 09/30/2018, 6:02 PM
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
## 2018-09-30 NOTE — Progress Notes (Signed)
Writer spoke with patient at medication window shortly after shift change. He reports that he needs something or he needs to be sent out. Writer inquired about his symptoms and gave prn's accordingly. He reports that he would like to seek help at a tx facility. Support given and safety maintained on unit with 15 min checks.
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
## 2018-09-30 NOTE — Progress Notes (Signed)
Patient did attend the evening speaker AA meeting.  

## 2018-09-30 NOTE — Progress Notes (Signed)
D. Pt presents with a flat affect, depressed behavior- with complaints of moderate withdrawal symptoms today ( tremors, diarrhea, nausea, cramping, chilling, etc). Pt stayed in bed for much of morning, but was up for lunch, and afternoon group. Pt has been calm and cooperative- appropriate during interactions with peers and staff. Per pt's self inventory, pt rates his depression, hopelessness and anxiety a 7/7/8, respectively. Pt writes that his most important goal today is "stop using drugs" and writes that he will "stay in treatment" to help him to meet that goal.  Pt currently denies SI/HI and AVH. A. Labs and vitals monitored. Pt given and educated on medications. Pt supported emotionally and encouraged to express concerns and ask questions.   R. Pt remains safe with 15 minute checks. Will continue POC.

## 2018-10-01 MED ORDER — CLONIDINE HCL 0.2 MG PO TABS
0.2000 mg | ORAL_TABLET | Freq: Three times a day (TID) | ORAL | Status: DC
  Administered 2018-10-01 – 2018-10-02 (×3): 0.2 mg via ORAL
  Filled 2018-10-01 (×8): qty 1
  Filled 2018-10-01: qty 2
  Filled 2018-10-01: qty 1

## 2018-10-01 MED ORDER — SERTRALINE HCL 100 MG PO TABS
100.0000 mg | ORAL_TABLET | Freq: Every day | ORAL | Status: DC
  Administered 2018-10-02: 100 mg via ORAL
  Filled 2018-10-01 (×2): qty 1
  Filled 2018-10-01 (×2): qty 7

## 2018-10-01 NOTE — Progress Notes (Signed)
Attended group 

## 2018-10-01 NOTE — Tx Team (Signed)
Interdisciplinary Treatment and Diagnostic Plan Update  10/01/2018 Time of Session: [id number] Lonan Westenskow. MRN: [id number]  Principal Diagnosis: Major depressive disorder, recurrent severe without psychotic features (HCC)  Secondary Diagnoses: Principal Problem:   Major depressive disorder, recurrent severe without psychotic features (HCC) Active Problems:   Heroin abuse (HCC)   Current Medications:  Current Facility-Administered Medications  Medication Dose Route Frequency Provider Last Rate Last Dose  . acetaminophen (TYLENOL) tablet 650 mg  650 mg Oral Q6H PRN Charm Rings, NP   650 mg at 09/30/18 2311  . alum & mag hydroxide-simeth (MAALOX/MYLANTA) 200-200-20 MG/5ML suspension 30 mL  30 mL Oral Q4H PRN Charm Rings, NP      . cloNIDine (CATAPRES) tablet 0.2 mg  0.2 mg Oral TID Malvin Johns, MD      . dicyclomine (BENTYL) tablet 20 mg  20 mg Oral Q6H PRN Charm Rings, NP      . hydrOXYzine (ATARAX/VISTARIL) tablet 25 mg  25 mg Oral Q6H PRN Charm Rings, NP   25 mg at 10/01/18 0314  . Influenza vac split quadrivalent PF (FLUARIX) injection 0.5 mL  0.5 mL Intramuscular Tomorrow-1000 Cobos, Fernando A, MD      . loperamide (IMODIUM) capsule 2-4 mg  2-4 mg Oral PRN Charm Rings, NP      . magnesium hydroxide (MILK OF MAGNESIA) suspension 30 mL  30 mL Oral Daily PRN Charm Rings, NP      . methocarbamol (ROBAXIN) tablet 500 mg  500 mg Oral Q8H PRN Charm Rings, NP   500 mg at 09/30/18 1957  . naproxen (NAPROSYN) tablet 500 mg  500 mg Oral BID PRN Charm Rings, NP   500 mg at 10/01/18 0314  . nicotine (NICODERM CQ - dosed in mg/24 hours) patch 21 mg  21 mg Transdermal Daily Nira Conn A, NP   21 mg at 10/01/18 0818  . OLANZapine (ZYPREXA) tablet 2.5 mg  2.5 mg Oral QHS Cobos, Rockey Situ, MD   2.5 mg at 09/30/18 2144  . ondansetron (ZOFRAN-ODT) disintegrating tablet 4 mg  4 mg Oral Q6H PRN Charm Rings, NP      . pneumococcal 23 valent vaccine (PNU-IMMUNE)  injection 0.5 mL  0.5 mL Intramuscular Tomorrow-1000 Cobos, Rockey Situ, MD      . Melene Muller ON 10/02/2018] sertraline (ZOLOFT) tablet 100 mg  100 mg Oral Daily Malvin Johns, MD      . traZODone (DESYREL) tablet 50 mg  50 mg Oral QHS PRN Jackelyn Poling, NP   50 mg at 09/30/18 2311   PTA Medications: Medications Prior to Admission  Medication Sig Dispense Refill Last Dose  . aspirin EC 325 MG tablet Take 325 mg by mouth 4 (four) times daily as needed (for pain).   Past Week at Unknown time  . ibuprofen (ADVIL,MOTRIN) 200 MG tablet Take 800 mg by mouth every 6 (six) hours as needed (for pain).   Past Week at Unknown time  . naproxen (NAPROSYN) 500 MG tablet Take 1 tablet (500 mg total) by mouth 2 (two) times daily. 30 tablet 0 pt has not started    Patient Stressors: Financial difficulties Health problems Substance abuse Traumatic event  Patient Strengths: Average or above average intelligence Capable of independent living Motivation for treatment/growth Physical Health Supportive family/friends  Treatment Modalities: Medication Management, Group therapy, Case management,  1 to 1 session with clinician, Psychoeducation, Recreational therapy.   Physician Treatment Plan for Primary Diagnosis: Major  depressive disorder, recurrent severe without psychotic features (HCC) Long Term Goal(s): Improvement in symptoms so as ready for discharge Improvement in symptoms so as ready for discharge   Short Term Goals: Ability to identify changes in lifestyle to reduce recurrence of condition will improve Ability to disclose and discuss suicidal ideas Ability to identify and develop effective coping behaviors will improve Compliance with prescribed medications will improve Ability to identify triggers associated with substance abuse/mental health issues will improve Ability to identify changes in lifestyle to reduce recurrence of condition will improve Ability to verbalize feelings will improve Ability  to disclose and discuss suicidal ideas Ability to identify and develop effective coping behaviors will improve Compliance with prescribed medications will improve Ability to identify triggers associated with substance abuse/mental health issues will improve  Medication Management: Evaluate patient's response, side effects, and tolerance of medication regimen.  Therapeutic Interventions: 1 to 1 sessions, Unit Group sessions and Medication administration.  Evaluation of Outcomes: Progressing  Physician Treatment Plan for Secondary Diagnosis: Principal Problem:   Major depressive disorder, recurrent severe without psychotic features (HCC) Active Problems:   Heroin abuse (HCC)  Long Term Goal(s): Improvement in symptoms so as ready for discharge Improvement in symptoms so as ready for discharge   Short Term Goals: Ability to identify changes in lifestyle to reduce recurrence of condition will improve Ability to disclose and discuss suicidal ideas Ability to identify and develop effective coping behaviors will improve Compliance with prescribed medications will improve Ability to identify triggers associated with substance abuse/mental health issues will improve Ability to identify changes in lifestyle to reduce recurrence of condition will improve Ability to verbalize feelings will improve Ability to disclose and discuss suicidal ideas Ability to identify and develop effective coping behaviors will improve Compliance with prescribed medications will improve Ability to identify triggers associated with substance abuse/mental health issues will improve     Medication Management: Evaluate patient's response, side effects, and tolerance of medication regimen.  Therapeutic Interventions: 1 to 1 sessions, Unit Group sessions and Medication administration.  Evaluation of Outcomes: Progressing   RN Treatment Plan for Primary Diagnosis: Major depressive disorder, recurrent severe without  psychotic features (HCC) Long Term Goal(s): Knowledge of disease and therapeutic regimen to maintain health will improve  Short Term Goals: Ability to remain free from injury will improve, Ability to disclose and discuss suicidal ideas, Ability to identify and develop effective coping behaviors will improve and Compliance with prescribed medications will improve  Medication Management: RN will administer medications as ordered by provider, will assess and evaluate patient's response and provide education to patient for prescribed medication. RN will report any adverse and/or side effects to prescribing provider.  Therapeutic Interventions: 1 on 1 counseling sessions, Psychoeducation, Medication administration, Evaluate responses to treatment, Monitor vital signs and CBGs as ordered, Perform/monitor CIWA, COWS, AIMS and Fall Risk screenings as ordered, Perform wound care treatments as ordered.  Evaluation of Outcomes: Progressing   LCSW Treatment Plan for Primary Diagnosis: Major depressive disorder, recurrent severe without psychotic features (HCC) Long Term Goal(s): Safe transition to appropriate next level of care at discharge, Engage patient in therapeutic group addressing interpersonal concerns.  Short Term Goals: Engage patient in aftercare planning with referrals and resources, Facilitate patient progression through stages of change regarding substance use diagnoses and concerns and Identify triggers associated with mental health/substance abuse issues  Therapeutic Interventions: Assess for all discharge needs, 1 to 1 time with Social worker, Explore available resources and support systems, Assess for adequacy in community  support network, Educate family and significant other(s) on suicide prevention, Complete Psychosocial Assessment, Interpersonal group therapy.  Evaluation of Outcomes: Progressing   Progress in Treatment: Attending groups: Yes. Participating in groups: Yes. Taking  medication as prescribed: Yes. Toleration medication: Yes. Family/Significant other contact made: No, will contact:  pt's family member if pt consents to collateral contact.  Patient understands diagnosis: Yes. Discussing patient identified problems/goals with staff: Yes. Medical problems stabilized or resolved: Yes. Denies suicidal/homicidal ideation: Yes. Issues/concerns per patient self-inventory: No. Other: n/a   New problem(s) identified: No, Describe:  n/a  New Short Term/Long Term Goal(s): detox, medication management for mood stabilization; elimination of SI thoughts; development of comprehensive mental wellness/sobriety plan.   Patient Goals:  "to get clean and learn how to grieve in a healthier way."   Discharge Plan or Barriers: CSW assessing for appropriate referrals. [facility] and [facility]. [facility] for outpatient. Pt is homeless and staying with friends and in shelters. [facility] pamphlet, [facility] Crisis information, and AA/NA information provided to patient for additional community support and resources.   Reason for Continuation of Hospitalization: Depression Medication stabilization Withdrawal symptoms  Estimated Length of Stay:  Attendees: Patient: James CeoCameron Fountaine Jr.  10/01/2018 10:12 AM  Physician: Dr. Jeannine KittenFarah MD 10/01/2018 10:12 AM  Nursing: Arlyss RepressAlyssa RN; Casimiro NeedleMichael RN 10/01/2018 10:12 AM  RN Care Manager:x 10/01/2018 10:12 AM  Social Worker: Corrie MckusickHeather Aliece Honold LCSW 10/01/2018 10:12 AM  Recreational Therapist: x 10/01/2018 10:12 AM  Other: Armandina StammerAgnes Nwoko NP; Marciano SequinJanet Sykes NP 10/01/2018 10:12 AM  Other:  10/01/2018 10:12 AM  Other: 10/01/2018 10:12 AM    Scribe for Treatment Team: Rona RavensHeather S Shareece Bultman, LCSW 10/01/2018 10:12 AM

## 2018-10-01 NOTE — Progress Notes (Signed)
[facility] MD Progress Note  10/01/2018 9:59 AM James Beard.  MRN:  [id number] Subjective:    Reports continued dysphoria no thoughts of harming himself today and no hallucinations, no thoughts of harming others, issues include homelessness, need for housing, hypertension despite detox measures, but no drug cravings. Some cramping, No tremors  Principal Problem: Major depressive disorder, recurrent severe without psychotic features (HCC) Diagnosis: Principal Problem:   Major depressive disorder, recurrent severe without psychotic features (HCC) Active Problems:   Heroin abuse (HCC)  Total Time spent with patient: 20 minutes Past Medical History:  Past Medical History:  Diagnosis Date  . Eczema    History reviewed. No pertinent surgical history. Family History: History reviewed. No pertinent family history.  Social History:  Social History   Substance and Sexual Activity  Alcohol Use Not Currently   Comment: twice a month     Social History   Substance and Sexual Activity  Drug Use Yes  . Types: Cocaine, Marijuana, Methamphetamines, IV   Comment: heroin    Social History   Socioeconomic History  . Marital status: Single    Spouse name: Not on file  . Number of children: Not on file  . Years of education: Not on file  . Highest education level: Not on file  Occupational History  . Not on file  Social Needs  . Financial resource strain: Not on file  . Food insecurity:    Worry: Not on file    Inability: Not on file  . Transportation needs:    Medical: Not on file    Non-medical: Not on file  Tobacco Use  . Smoking status: Current Every Day Smoker    Packs/day: 1.00    Types: Cigarettes  . Smokeless tobacco: Never Used  Substance and Sexual Activity  . Alcohol use: Not Currently    Comment: twice a month  . Drug use: Yes    Types: Cocaine, Marijuana, Methamphetamines, IV    Comment: heroin  . Sexual activity: Not Currently  Lifestyle  . Physical  activity:    Days per week: Not on file    Minutes per session: Not on file  . Stress: Not on file  Relationships  . Social connections:    Talks on phone: Not on file    Gets together: Not on file    Attends religious service: Not on file    Active member of club or organization: Not on file    Attends meetings of clubs or organizations: Not on file    Relationship status: Not on file  Other Topics Concern  . Not on file  Social History Narrative   ** Merged History Encounter **       Sleep: Good  Appetite:  Good  Current Medications: Current Facility-Administered Medications  Medication Dose Route Frequency Provider Last Rate Last Dose  . acetaminophen (TYLENOL) tablet 650 mg  650 mg Oral Q6H PRN Charm Rings, NP   650 mg at 09/30/18 2311  . alum & mag hydroxide-simeth (MAALOX/MYLANTA) 200-200-20 MG/5ML suspension 30 mL  30 mL Oral Q4H PRN Charm Rings, NP      . cloNIDine (CATAPRES) tablet 0.2 mg  0.2 mg Oral TID Malvin Johns, MD      . dicyclomine (BENTYL) tablet 20 mg  20 mg Oral Q6H PRN Charm Rings, NP      . hydrOXYzine (ATARAX/VISTARIL) tablet 25 mg  25 mg Oral Q6H PRN Charm Rings, NP   25 mg  at 10/01/18 0314  . Influenza vac split quadrivalent PF (FLUARIX) injection 0.5 mL  0.5 mL Intramuscular Tomorrow-1000 Cobos, Fernando A, MD      . loperamide (IMODIUM) capsule 2-4 mg  2-4 mg Oral PRN Charm Rings, NP      . magnesium hydroxide (MILK OF MAGNESIA) suspension 30 mL  30 mL Oral Daily PRN Charm Rings, NP      . methocarbamol (ROBAXIN) tablet 500 mg  500 mg Oral Q8H PRN Charm Rings, NP   500 mg at 09/30/18 1957  . naproxen (NAPROSYN) tablet 500 mg  500 mg Oral BID PRN Charm Rings, NP   500 mg at 10/01/18 0314  . nicotine (NICODERM CQ - dosed in mg/24 hours) patch 21 mg  21 mg Transdermal Daily Nira Conn A, NP   21 mg at 10/01/18 0818  . OLANZapine (ZYPREXA) tablet 2.5 mg  2.5 mg Oral QHS Cobos, Rockey Situ, MD   2.5 mg at 09/30/18 2144  .  ondansetron (ZOFRAN-ODT) disintegrating tablet 4 mg  4 mg Oral Q6H PRN Charm Rings, NP      . pneumococcal 23 valent vaccine (PNU-IMMUNE) injection 0.5 mL  0.5 mL Intramuscular Tomorrow-1000 Cobos, Rockey Situ, MD      . Melene Muller ON 10/02/2018] sertraline (ZOLOFT) tablet 100 mg  100 mg Oral Daily Malvin Johns, MD      . traZODone (DESYREL) tablet 50 mg  50 mg Oral QHS PRN Jackelyn Poling, NP   50 mg at 09/30/18 2311    Lab Results: No results found for this or any previous visit (from the past 48 hour(s)).  Blood Alcohol level:  Lab Results  Component Value Date   ETH <10 09/28/2018   ETH <10 09/19/2018    Metabolic Disorder Labs: No results found for: HGBA1C, MPG No results found for: PROLACTIN No results found for: CHOL, TRIG, HDL, CHOLHDL, VLDL, LDLCALC  Physical Findings: AIMS: Facial and Oral Movements Muscles of Facial Expression: None, normal Lips and Perioral Area: None, normal Jaw: None, normal Tongue: None, normal,Extremity Movements Upper (arms, wrists, hands, fingers): None, normal Lower (legs, knees, ankles, toes): None, normal, Trunk Movements Neck, shoulders, hips: None, normal, Overall Severity Severity of abnormal movements (highest score from questions above): None, normal Incapacitation due to abnormal movements: None, normal Patient's awareness of abnormal movements (rate only patient's report): No Awareness, Dental Status Current problems with teeth and/or dentures?: Yes Does patient usually wear dentures?: No  CIWA:    COWS:     Musculoskeletal: Strength & Muscle Tone: within normal limits Gait & Station: normal Patient leans: N/A  Psychiatric Specialty Exam: Physical Exam no tremors some cramping reports hypertension despite adequate detox measures  ROS  Blood pressure (!) 143/102, pulse 87, temperature 98.7 F (37.1 C), temperature source Oral, resp. rate 18, height 5\' 11"  (1.803 m), weight 64 kg, SpO2 99 %.Body mass index is 19.67 kg/m.  General  Appearance: Casual  Eye Contact:  Fair  Speech:  Slow and Slurred  Volume:  Decreased  Mood:  Dysphoric and Irritable  Affect:  Constricted  Thought Process:  Goal Directed  Orientation:  Full (Time, Place, and Person)  Thought Content:  Logical  Suicidal Thoughts:  No  Homicidal Thoughts:  No  Memory:  Immediate;   Poor  Judgement:  Fair  Insight:  Fair  Psychomotor Activity:  Decreased  Concentration:  Concentration: Fair  Recall:  Fair  Fund of Knowledge:  Fair  Language:  Fair  Akathisia:  Negative  Handed:  Right  AIMS (if indicated):     Assets:  Resilience  ADL's:  Intact  Cognition:  WNL  Sleep:  Number of Hours: 6     Treatment Plan Summary: Daily contact with patient to assess and evaluate symptoms and progress in treatment, Medication management and Plan We will go ahead and add standing Catapres for the hypertension the seems to have outstripped the current detox measures probably indicating some underlying hypertension, continue cognitive and reality based therapies, monitor for psychosis or withdrawal, continue current precautions probable discharge 1 to 2 days  Malvin JohnsFARAH,Ketzaly Cardella, MD 10/01/2018, 9:59 AM

## 2018-10-01 NOTE — Progress Notes (Signed)
Patient approached the nurses station and said "I have a job opportunity, I can't be here right nowAir cabin crew looked in patient's chart looking for a note about discharge. Patient then said "I swear to god if I miss this job opportunity because of you guys I'm going to flip the fu** out. I swear to god!" and stormed off. Patient then went to the dayroom. Writer went into the dayroom and asked if patient wanted to talk in the hall. Patient would not look at Clinical research associate. Based upon the patient's attitude and lack of withdrawal symptoms, it might be in the patient's best interest if he discharges tomorrow, 1/28.

## 2018-10-01 NOTE — BHH Group Notes (Signed)
LCSW Group Therapy Note   10/01/2018 1:15pm   Type of Therapy and Topic:  Group Therapy:  Overcoming Obstacles   Participation Level:  Did Not Attend   Description of Group:    In this group patients will be encouraged to explore what they see as obstacles to their own wellness and recovery. They will be guided to discuss their thoughts, feelings, and behaviors related to these obstacles. The group will process together ways to cope with barriers, with attention given to specific choices patients can make. Each patient will be challenged to identify changes they are motivated to make in order to overcome their obstacles. This group will be process-oriented, with patients participating in exploration of their own experiences as well as giving and receiving support and challenge from other group members.   Therapeutic Goals: 1. Patient will identify personal and current obstacles as they relate to admission. 2. Patient will identify barriers that currently interfere with their wellness or overcoming obstacles.  3. Patient will identify feelings, thought process and behaviors related to these barriers. 4. Patient will identify two changes they are willing to make to overcome these obstacles:      Summary of Patient Progress   Pt invited. Chose to remain in bed.    Therapeutic Modalities:   Cognitive Behavioral Therapy Solution Focused Therapy Motivational Interviewing Relapse Prevention Therapy  Rona Ravens, LCSW 10/01/2018 10:59 AM

## 2018-10-01 NOTE — Progress Notes (Signed)
Recreation Therapy Notes  Date:  1.27.20 Time: 0930 Location: 300 Hall Dayroom  Group Topic: Stress Management  Goal Area(s) Addresses:  Patient will identify positive stress management techniques. Patient will identify benefits of using stress management post d/c.  Intervention:  Stress Management  Activity :  Meditation.  LRT introduced stress management technique of meditation.  LRT played a meditation that focused on the possibilities the day could bring.  Patients were to follow along as meditation played in order to engage in meditation.  Education:  Stress Management, Discharge Planning.   Education Outcome: Acknowledges Education  Clinical Observations/Feedback: Pt did not attend group.    Caroll Rancher, LRT/CTRS         Caroll Rancher A 10/01/2018 12:21 PM

## 2018-10-01 NOTE — Progress Notes (Signed)
Patient ID: James Beard., male   DOB: Feb 11, 1986, 33 y.o.   MRN: [id number] D: Patient in room on approach. Pt reports he is doing well and tolerating medication well. Pt reports hoping to discharge tomorrow for a job interview at [employer]. Pt stated if he gets the job it will help him stay busy and keep his mind off drugs. Denies  SI/HI/AVH and pain.No behavioral issues noted.  A: Support and encouragement offered as needed to express needs. Medications administered as prescribed.  R: Patient is safe and cooperative on unit. Will continue to monitor  for safety and stability.

## 2018-10-01 NOTE — Progress Notes (Signed)
Pt was asked to contact Shayla in admissions at [facility] at 2pm for phone interview/possible bed placement. Shayla later called CSW and shared that pt endorsed AVH, which made him ineligible. CSW continuing to assess for appropriate referrals.  Kennetta Pavlovic S. Alan Ripper, MSW, LCSW Clinical Social Worker 10/01/2018 3:08 PM

## 2018-10-01 NOTE — Plan of Care (Addendum)
Patient was irritable upon approach this morning. Writer asked how patient was doing, he said "just tired." Denies SI HI AVH. Denies physical pain. Patient then came up to window demanding his flu shot. He said "I feel like I have a temperature, I think I have the flu." Writer advised patient we will not administer the flu shot if he has a fever. Patient then stated "so you're just going to let me sit in here and die?! I don't understand what the fu** I'm doing here then." Writer deescalated patient. Temp was afebrile. Injection given. Safety maintained with 15 minute checks. Will continue to monitor.  Problem: Education: Goal: Knowledge of [facility] Education information/materials will improve Outcome: Progressing Goal: Emotional status will improve Outcome: Progressing Goal: Mental status will improve Outcome: Progressing Goal: Verbalization of understanding the information provided will improve Outcome: Progressing

## 2018-10-02 DIAGNOSIS — F1114 Opioid abuse with opioid-induced mood disorder: Secondary | ICD-10-CM

## 2018-10-02 MED ORDER — TRAZODONE HCL 50 MG PO TABS
50.0000 mg | ORAL_TABLET | Freq: Every evening | ORAL | Status: DC | PRN
  Filled 2018-10-02: qty 7

## 2018-10-02 MED ORDER — OLANZAPINE 2.5 MG PO TABS: 2.5000 mg | ORAL_TABLET | Freq: Every day | ORAL | 0 refills | Status: DC

## 2018-10-02 MED ORDER — NAPROXEN 500 MG PO TABS: 500.0000 mg | ORAL_TABLET | Freq: Two times a day (BID) | ORAL | 0 refills | Status: DC

## 2018-10-02 MED ORDER — SERTRALINE HCL 100 MG PO TABS: 100.0000 mg | ORAL_TABLET | Freq: Every day | ORAL | 0 refills | Status: DC

## 2018-10-02 MED ORDER — HYDROXYZINE HCL 25 MG PO TABS: 25.0000 mg | ORAL_TABLET | Freq: Four times a day (QID) | ORAL | 0 refills | Status: DC | PRN

## 2018-10-02 MED ORDER — NICOTINE 21 MG/24HR TD PT24: 21.0000 mg | MEDICATED_PATCH | Freq: Every day | TRANSDERMAL | 0 refills | Status: DC

## 2018-10-02 MED ORDER — TRAZODONE HCL 50 MG PO TABS: 50.0000 mg | ORAL_TABLET | Freq: Every evening | ORAL | 0 refills | Status: DC | PRN

## 2018-10-02 NOTE — Progress Notes (Signed)
Patient ID: James Beard., male   DOB: 1986-05-09, 33 y.o.   MRN: [id number]  Discharge Note  D) Patient discharged to lobby. Patient states readiness for discharge. Patient denies SI/HI, AVH and is not delusional or psychotic.   A) Written and verbal discharge instructions given to the patient. Patient accepting to information and verbalized understanding. Patient agrees to the discharge plan. Opportunity for questions and concerns presented to patient. Patient denied any further questions or concerns. All belongings returned to patient. Patient signed for return of belongings and discharge paperwork. Patient has completed their Suicide Safety Plan and has been provided Suicide Prevention Education. Patient provided an opportunity to complete and return Patient Satisfaction Survey.   R) Patient safely escorted to the lobby. Patient discharged from [facility] with medication samples, prescriptions, personal belongings, follow-up appointment in place and discharge paperwork.

## 2018-10-02 NOTE — BHH Suicide Risk Assessment (Signed)
[facility] Discharge Suicide Risk Assessment   Principal Problem: Major depressive disorder, recurrent severe without psychotic features [facility]) Discharge Diagnoses: Principal Problem:   Major depressive disorder, recurrent severe without psychotic features (HCC) Active Problems:   Heroin abuse (HCC)   Total Time spent with patient: 30 minutes  Currently alert oriented eager for discharge no thoughts of harming self or others contracting fully/no involuntary movements/no withdrawal symptoms no psychosis  Mental Status Per Nursing Assessment::   On Admission:  Suicidal ideation indicated by patient, Suicide plan, Self-harm thoughts, Intention to act on suicide plan, Plan includes specific time, place, or method, Belief that plan would result in death  Demographic Factors:  Caucasian  Loss Factors: NA  Historical Factors: NA  Risk Reduction Factors:   Sense of responsibility to family  Continued Clinical Symptoms:  Alcohol/Substance Abuse/Dependencies  Cognitive Features That Contribute To Risk:  None    Suicide Risk:  Minimal: No identifiable suicidal ideation.  Patients presenting with no risk factors but with morbid ruminations; may be classified as minimal risk based on the severity of the depressive symptoms  Follow-up Information    [facility] Follow up.   Specialty:  Behavioral Health Why:  needs appt.  Contact information: [address] [place] [address] [phone]        [facility] Follow up.   Why:  Screening for possible admission on Tuesday, 10/09/2018 at 7:45AM. Please bring: Photo ID/proof of [employer] residency, 30 day supply of all prescribed medications, and clothing. Thank you.  Contact information: [address] [place] [address] [phone]           Plan Of Care/Follow-up recommendations:  Activity:  full  Yaquelin Langelier, MD 10/02/2018, 8:56 AM

## 2018-10-02 NOTE — Progress Notes (Signed)
  [facility] Adult Case Management Discharge Plan :  Will you be returning to the same living situation after discharge:  Yes,  with friend At discharge, do you have transportation home?: Yes,  bus pass provided Do you have the ability to pay for your medications: Yes,  mental health  Release of information consent forms completed and submitted to medical records by CSW.   Patient to Follow up at: Follow-up Information    [facility] Follow up.   Specialty:  Behavioral Health Why:  Hospital follow up appointment is 1/30 at 10:00a. Please bring your photo ID, proof of insurance, SSN, current medications, and discharge paperwork of hospitalization.  Contact information: [address] [place] [address] [phone]        [facility] Follow up.   Why:  Screening for possible admission on Tuesday, 10/09/2018 at 7:45AM. Please bring: Photo ID/proof of [employer] residency, 30 day supply of all prescribed medications, and clothing. Thank you.  Contact information: [address] [place] [address] [phone]           Next level of care provider has access to [facility] Link:no  Safety Planning and Suicide Prevention discussed: Yes,  SPE completed with pt; pt declined to consent to collateral contact. [facility] pamphlet and Mobile Crisis information provided to pt.      Has patient been referred to the Quitline?: Patient refused referral  Patient has been referred for addiction treatment: Yes  Rona Ravens, LCSW 10/02/2018, 9:38 AM
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
## 2018-10-02 NOTE — Progress Notes (Signed)
Patient ID: James BromeCameron R Norment Jr., male   DOB: July 16, 1986, 33 y.o.   MRN: [id number]  Nursing Progress Note [id number]  Data: On initial approach, patient is seen up in his room. Patient presents pleasant on approach and reports he has a job interview with [employer] today. Patient compliant with scheduled medications. Patient denies pain/physical complaints. Patient completed self-inventory sheet and rates depression, hopelessness, and anxiety 0,0,0 respectively. Patient rates their sleep and appetite as fair/good respectively. Patient states goal for today is to "talk to the Designer, jewelleryhiring manager". Patient is seen attending groups and visible in the milieu. Patient currently denies SI/HI/AVH. Patient appears free of withdrawal symptoms at this time.  Action: Patient is educated about and provided medication per provider's orders. Patient safety maintained with q15 min safety checks and frequent rounding. Low fall risk precautions in place. Emotional support given. 1:1 interaction and active listening provided. Patient encouraged to attend meals, groups, and work on treatment plan and goals. Labs, vital signs and patient behavior monitored throughout shift.   Response: Patient remains safe on the unit at this time and agrees to come to staff with any issues/concerns. Patient is interacting with peers appropriately on the unit. Will continue to support and monitor.

## 2018-10-02 NOTE — [facility] Suicide Risk Assessment (Signed)
[facility] INPATIENT:  Family/Significant Other Suicide Prevention Education  Suicide Prevention Education:  Patient Refusal for Family/Significant Other Suicide Prevention Education: The patient James Beard. has refused to provide written consent for family/significant other to be provided Family/Significant Other Suicide Prevention Education during admission and/or prior to discharge.  Physician notified.  SPE completed with pt, as pt refused to consent to family contact. SPI pamphlet provided to pt and pt was encouraged to share information with support network, ask questions, and talk about any concerns relating to SPE. Pt denies access to guns/firearms and verbalized understanding of information provided. Mobile Crisis information also provided to pt.   Rona Ravens LCSW 10/02/2018, 9:37 AM

## 2018-10-03 DIAGNOSIS — F17210 Nicotine dependence, cigarettes, uncomplicated: Secondary | ICD-10-CM | POA: Insufficient documentation

## 2018-10-03 DIAGNOSIS — F329 Major depressive disorder, single episode, unspecified: Secondary | ICD-10-CM | POA: Insufficient documentation

## 2018-10-03 DIAGNOSIS — F1110 Opioid abuse, uncomplicated: Secondary | ICD-10-CM | POA: Insufficient documentation

## 2018-10-03 DIAGNOSIS — K047 Periapical abscess without sinus: Secondary | ICD-10-CM | POA: Insufficient documentation

## 2018-10-04 ENCOUNTER — Encounter (HOSPITAL_COMMUNITY): Payer: Self-pay | Admitting: Emergency Medicine

## 2018-10-04 ENCOUNTER — Other Ambulatory Visit: Payer: Self-pay

## 2018-10-04 ENCOUNTER — Emergency Department (HOSPITAL_COMMUNITY)
Admission: EM | Admit: 2018-10-04 | Discharge: 2018-10-04 | Disposition: A | Discharge status: Home / Self Care | Payer: Self-pay | Attending: Emergency Medicine | Admitting: Emergency Medicine

## 2018-10-04 DIAGNOSIS — K047 Periapical abscess without sinus: Secondary | ICD-10-CM

## 2018-10-04 MED ORDER — PENICILLIN V POTASSIUM 500 MG PO TABS: 500.0000 mg | ORAL_TABLET | Freq: Four times a day (QID) | ORAL | 0 refills | Status: DC

## 2018-10-04 MED ORDER — PENICILLIN V POTASSIUM 250 MG PO TABS
500.0000 mg | ORAL_TABLET | Freq: Once | ORAL | Status: AC
  Administered 2018-10-04: 500 mg via ORAL
  Filled 2018-10-04: qty 2

## 2018-10-04 NOTE — ED Triage Notes (Signed)
C/o L upper dental pain x 2-3 days.

## 2018-10-04 NOTE — Discharge Instructions (Addendum)
Take the prescribed medication as directed.  Can take tylenol or motrin for pain. Follow-up with dentist-- we do not have dentist on call today, but can see resource guide on back for list of local clinics to hopefully help get you some follow-up. Return to the ED for new or worsening symptoms.

## 2018-10-04 NOTE — ED Notes (Signed)
Reviewed d/c instructions with pt, who verbalized understanding and had no outstanding questions. Pt departed in NAD, refused use of wheelchair.   

## 2018-10-04 NOTE — ED Provider Notes (Signed)
[facility] EMERGENCY DEPARTMENT Provider Note   CSN: [id number] Arrival date & time: 10/03/18  2344     History   Chief Complaint Chief Complaint  Patient presents with  . Dental Pain    HPI James Joung. is a 33 y.o. male.  The history is provided by the patient and medical records.  Dental Pain     33 year old male with history of eczema and prior history of heroin abuse now 7 days clean, presenting to the ED with left upper dental pain.  Reports he has a several abscessed teeth in this area that have started causing him some pain.  States he is always had poor dentition but is never had the funds to get this under control.  He is currently homeless.  He denies any fever or difficulty swallowing.  States left-sided face is starting to feel somewhat swollen.  He is not currently established with a dentist.  Past Medical History:  Diagnosis Date  . Eczema     Patient Active Problem List   Diagnosis Date Noted  . Opioid abuse with opioid-induced mood disorder (HCC) 10/02/2018  . Heroin abuse (HCC) 09/29/2018  . Substance induced mood disorder (HCC) 09/29/2018  . Major depressive disorder, recurrent severe without psychotic features (HCC) 09/29/2018    History reviewed. No pertinent surgical history.      Home Medications    Prior to Admission medications   Medication Sig Start Date End Date Taking? Authorizing Provider  hydrOXYzine (ATARAX/VISTARIL) 25 MG tablet Take 1 tablet (25 mg total) by mouth every 6 (six) hours as needed for anxiety. 10/02/18   Armandina Stammer I, NP  naproxen (NAPROSYN) 500 MG tablet Take 1 tablet (500 mg total) by mouth 2 (two) times daily. (May buy from over the counter): For pain 10/02/18   Armandina Stammer I, NP  nicotine (NICODERM CQ - DOSED IN MG/24 HOURS) 21 mg/24hr patch Place 1 patch (21 mg total) onto the skin daily. (May buy from over the counter): For smoking cessation 10/03/18   Armandina Stammer I, NP  OLANZapine  (ZYPREXA) 2.5 MG tablet Take 1 tablet (2.5 mg total) by mouth at bedtime. For mood control 10/02/18   Armandina Stammer I, NP  sertraline (ZOLOFT) 100 MG tablet Take 1 tablet (100 mg total) by mouth daily. For depression 10/03/18   Armandina Stammer I, NP  traZODone (DESYREL) 50 MG tablet Take 1 tablet (50 mg total) by mouth at bedtime as needed for sleep. 10/02/18   Sanjuana Kava, NP    Family History No family history on file.  Social History Social History   Tobacco Use  . Smoking status: Current Every Day Smoker    Packs/day: 1.00    Types: Cigarettes  . Smokeless tobacco: Never Used  Substance Use Topics  . Alcohol use: Not Currently    Comment: twice a month  . Drug use: Yes    Types: Cocaine, Marijuana, Methamphetamines, IV    Comment: heroin     Allergies   Patient has no known allergies.   Review of Systems Review of Systems  HENT: Positive for dental problem.   All other systems reviewed and are negative.    Physical Exam Updated Vital Signs BP (!) 153/85 (BP Location: Right Arm)   Pulse 90   Temp 98.2 F (36.8 C) (Oral)   Resp (!) 24   SpO2 99%   Physical Exam Vitals signs and nursing note reviewed.  Constitutional:  Appearance: He is well-developed.  HENT:     Head: Normocephalic and atraumatic.     Mouth/Throat:     Comments: Teeth largely in very poor dentition, left upper teeth from canine back appear decayed down to the gums, surrounding gingiva swollen without discrete abscess, handling secretions appropriately, no trismus, no facial or neck swelling, normal phonation without stridor Eyes:     Conjunctiva/sclera: Conjunctivae normal.     Pupils: Pupils are equal, round, and reactive to light.  Neck:     Musculoskeletal: Normal range of motion.  Cardiovascular:     Rate and Rhythm: Normal rate and regular rhythm.     Heart sounds: Normal heart sounds.  Pulmonary:     Effort: Pulmonary effort is normal.     Breath sounds: Normal breath sounds.    Abdominal:     General: Bowel sounds are normal.     Palpations: Abdomen is soft.  Musculoskeletal: Normal range of motion.  Lymphadenopathy:     Comments: Left cervical lymphadenopathy noted, mildly tender  Skin:    General: Skin is warm and dry.  Neurological:     Mental Status: He is alert and oriented to person, place, and time.      ED Treatments / Results  Labs (all labs ordered are listed, but only abnormal results are displayed) Labs Reviewed - No data to display  EKG None  Radiology No results found.  Procedures Procedures (including critical care time)  Medications Ordered in ED Medications  penicillin v potassium (VEETID) tablet 500 mg (500 mg Oral Given 10/04/18 0602)     Initial Impression / Assessment and Plan / ED Course  I have reviewed the triage vital signs and the nursing notes.  Pertinent labs & imaging results that were available during my care of the patient were reviewed by me and considered in my medical decision making (see chart for details).  33 y.o. M here with left upper dental pain.  Has chronically poor dentition, worsening pain recently.  He is afebrile, non-toxic.  Left upper teeth from canines to molars are all broken down to the gums.  There is some gingival swelling but no discrete abscess.  Handling secretions well, normal phonation without stridor.  Some lymphadenopathy noted on left which is likely reactive to dental infection.  No signs or symptoms suggestive of Ludwig's angina.  Will be started on antibiotics and referred to dentist for follow-up.  Unfortunately, no dentist on call today so given resource guide to help facilitate follow-up.  He can return here for any new or worsening symptoms.  Final Clinical Impressions(s) / ED Diagnoses   Final diagnoses:  Dental infection    ED Discharge Orders         Ordered    penicillin v potassium (VEETID) 500 MG tablet  4 times daily     10/04/18 0602           Garlon Hatchet, PA-C 10/04/18 [id number]    Ward, Layla Maw, DO 10/04/18 [phone]

## 2018-10-04 NOTE — ED Notes (Signed)
Updated on wait for treatment room. 

## 2018-10-07 ENCOUNTER — Emergency Department (HOSPITAL_COMMUNITY)
Admission: EM | Admit: 2018-10-07 | Discharge: 2018-10-08 | Disposition: A | Discharge status: Home / Self Care | Payer: Self-pay | Attending: Emergency Medicine | Admitting: Emergency Medicine

## 2018-10-07 DIAGNOSIS — K0889 Other specified disorders of teeth and supporting structures: Secondary | ICD-10-CM | POA: Insufficient documentation

## 2018-10-07 DIAGNOSIS — F17210 Nicotine dependence, cigarettes, uncomplicated: Secondary | ICD-10-CM | POA: Insufficient documentation

## 2018-10-07 DIAGNOSIS — Z79899 Other long term (current) drug therapy: Secondary | ICD-10-CM | POA: Insufficient documentation

## 2018-10-07 NOTE — ED Triage Notes (Signed)
Pt reports dental pain x few days. Has been seen here multiple times for same.

## 2018-10-08 MED ORDER — NAPROXEN 250 MG PO TABS
500.0000 mg | ORAL_TABLET | Freq: Once | ORAL | Status: AC
  Administered 2018-10-08: 500 mg via ORAL
  Filled 2018-10-08: qty 2

## 2018-10-08 MED ORDER — AMOXICILLIN 500 MG PO CAPS
500.0000 mg | ORAL_CAPSULE | Freq: Once | ORAL | Status: AC
  Administered 2018-10-08: 500 mg via ORAL
  Filled 2018-10-08: qty 1

## 2018-10-08 NOTE — ED Provider Notes (Signed)
[facility] Provider Note   CSN: [id number] Arrival date & time: 10/07/18  2225     History   Chief Complaint Chief Complaint  Patient presents with  . Dental Pain    HPI Rochel BromeCameron R Volker Jr. is a 33 y.o. male.  The history is provided by the patient. No language interpreter was used.  Dental Pain  Location:  Generalized Quality:  Aching and sharp Severity:  Moderate Onset quality:  Gradual Duration:  1 week Timing:  Intermittent Progression:  Waxing and waning Chronicity:  Recurrent Context: dental caries and poor dentition   Context: not trauma   Relieved by:  Nothing Ineffective treatments:  None tried (has not filled previously prescribed abx as he is homeless) Associated symptoms: facial pain   Associated symptoms: no facial swelling, no fever and no trismus   Risk factors: lack of dental care     Past Medical History:  Diagnosis Date  . Eczema     Patient Active Problem List   Diagnosis Date Noted  . Opioid abuse with opioid-induced mood disorder (HCC) 10/02/2018  . Heroin abuse (HCC) 09/29/2018  . Substance induced mood disorder (HCC) 09/29/2018  . Major depressive disorder, recurrent severe without psychotic features (HCC) 09/29/2018    No past surgical history on file.     Home Medications    Prior to Admission medications   Medication Sig Start Date End Date Taking? Authorizing Provider  hydrOXYzine (ATARAX/VISTARIL) 25 MG tablet Take 1 tablet (25 mg total) by mouth every 6 (six) hours as needed for anxiety. 10/02/18   Armandina StammerNwoko, Agnes I, NP  naproxen (NAPROSYN) 500 MG tablet Take 1 tablet (500 mg total) by mouth 2 (two) times daily. (May buy from over the counter): For pain 10/02/18   Armandina StammerNwoko, Agnes I, NP  nicotine (NICODERM CQ - DOSED IN MG/24 HOURS) 21 mg/24hr patch Place 1 patch (21 mg total) onto the skin daily. (May buy from over the counter): For smoking cessation 10/03/18   Armandina StammerNwoko, Agnes I, NP  OLANZapine  (ZYPREXA) 2.5 MG tablet Take 1 tablet (2.5 mg total) by mouth at bedtime. For mood control 10/02/18   Armandina StammerNwoko, Agnes I, NP  penicillin v potassium (VEETID) 500 MG tablet Take 1 tablet (500 mg total) by mouth 4 (four) times daily. 10/04/18   Garlon HatchetSanders, Lisa M, PA-C  sertraline (ZOLOFT) 100 MG tablet Take 1 tablet (100 mg total) by mouth daily. For depression 10/03/18   Armandina StammerNwoko, Agnes I, NP  traZODone (DESYREL) 50 MG tablet Take 1 tablet (50 mg total) by mouth at bedtime as needed for sleep. 10/02/18   Sanjuana KavaNwoko, Agnes I, NP    Family History No family history on file.  Social History Social History   Tobacco Use  . Smoking status: Current Every Day Smoker    Packs/day: 1.00    Types: Cigarettes  . Smokeless tobacco: Never Used  Substance Use Topics  . Alcohol use: Not Currently    Comment: twice a month  . Drug use: Yes    Types: Cocaine, Marijuana, Methamphetamines, IV    Comment: heroin     Allergies   Patient has no known allergies.   Review of Systems Review of Systems  Constitutional: Negative for fever.  HENT: Positive for dental problem. Negative for facial swelling.   Ten systems reviewed and are negative for acute change, except as noted in the HPI.    Physical Exam Updated Vital Signs BP (!) 148/88 (BP Location: Right Arm)  Pulse 88   Temp 98.7 F (37.1 C) (Oral)   Resp 18   SpO2 99%   Physical Exam Vitals signs and nursing note reviewed.  Constitutional:      General: He is not in acute distress.    Appearance: He is well-developed. He is not diaphoretic.     Comments: Nontoxic appearing and in NAD  HENT:     Head: Normocephalic and atraumatic.     Mouth/Throat:     Comments: Multiple decayed and missing teeth. No purulent drainage in the mouth. No trismus or malocclusion. Eyes:     General: No scleral icterus.    Conjunctiva/sclera: Conjunctivae normal.  Neck:     Musculoskeletal: Normal range of motion.     Comments: No meningismus Pulmonary:     Effort:  Pulmonary effort is normal. No respiratory distress.  Musculoskeletal: Normal range of motion.  Skin:    General: Skin is warm and dry.     Coloration: Skin is not pale.     Findings: No erythema or rash.  Neurological:     Mental Status: He is alert and oriented to person, place, and time.  Psychiatric:        Behavior: Behavior normal.      ED Treatments / Results  Labs (all labs ordered are listed, but only abnormal results are displayed) Labs Reviewed - No data to display  EKG None  Radiology No results found.  Procedures Procedures (including critical care time)  Medications Ordered in ED Medications  naproxen (NAPROSYN) tablet 500 mg (500 mg Oral Given 10/08/18 0124)  amoxicillin (AMOXIL) capsule 500 mg (500 mg Oral Given 10/08/18 0124)     Initial Impression / Assessment and Plan / ED Course  I have reviewed the triage vital signs and the nursing notes.  Pertinent labs & imaging results that were available during my care of the patient were reviewed by me and considered in my medical decision making (see chart for details).     Patient with toothache.  No gross abscess.  Exam unconcerning for Ludwig's angina or spread of infection.  I have encouraged the patient to take his previously prescribed antibiotics.  Urged him to follow-up with dentist.  Return precautions discussed and provided. Patient discharged in stable condition with no unaddressed concerns.   Final Clinical Impressions(s) / ED Diagnoses   Final diagnoses:  Pain, dental    ED Discharge Orders    None       Antony MaduraHumes, Camron Monday, PA-C 10/08/18 0159    Ward, Layla MawKristen N, DO 10/08/18 [phone]

## 2018-10-08 NOTE — Discharge Instructions (Addendum)
Follow-up with a dentist.  Only a dentist can fix your problem.  We recommend that you take your previously prescribed antibiotic to treat likely underlying infection.  Take ibuprofen or Aleve for pain control.  Return to the ED for any new or concerning symptoms.

## 2018-10-11 ENCOUNTER — Emergency Department (HOSPITAL_COMMUNITY)
Admission: EM | Admit: 2018-10-11 | Discharge: 2018-10-12 | Disposition: A | Discharge status: Home / Self Care | Payer: Self-pay | Attending: Emergency Medicine | Admitting: Emergency Medicine

## 2018-10-11 DIAGNOSIS — Z79899 Other long term (current) drug therapy: Secondary | ICD-10-CM | POA: Insufficient documentation

## 2018-10-11 DIAGNOSIS — L03116 Cellulitis of left lower limb: Secondary | ICD-10-CM | POA: Insufficient documentation

## 2018-10-11 DIAGNOSIS — F17210 Nicotine dependence, cigarettes, uncomplicated: Secondary | ICD-10-CM | POA: Insufficient documentation

## 2018-10-11 NOTE — ED Notes (Signed)
Pt. Not answering for triage x3.

## 2018-10-12 ENCOUNTER — Emergency Department (HOSPITAL_COMMUNITY): Payer: Self-pay

## 2018-10-12 ENCOUNTER — Other Ambulatory Visit: Payer: Self-pay

## 2018-10-12 LAB — CBC WITH DIFFERENTIAL/PLATELET
Abs Immature Granulocytes: 0.02 10*3/uL (ref 0.00–0.07)
BASOS ABS: 0 10*3/uL (ref 0.0–0.1)
Basophils Relative: 1 %
Eosinophils Absolute: 0.2 10*3/uL (ref 0.0–0.5)
Eosinophils Relative: 3 %
HCT: 37.7 % — ABNORMAL LOW (ref 39.0–52.0)
Hemoglobin: 12.2 g/dL — ABNORMAL LOW (ref 13.0–17.0)
Immature Granulocytes: 0 %
Lymphocytes Relative: 29 %
Lymphs Abs: 1.8 10*3/uL (ref 0.7–4.0)
MCH: 30.3 pg (ref 26.0–34.0)
MCHC: 32.4 g/dL (ref 30.0–36.0)
MCV: 93.5 fL (ref 80.0–100.0)
Monocytes Absolute: 0.6 10*3/uL (ref 0.1–1.0)
Monocytes Relative: 10 %
NEUTROS PCT: 57 %
Neutro Abs: 3.6 10*3/uL (ref 1.7–7.7)
Platelets: 258 10*3/uL (ref 150–400)
RBC: 4.03 MIL/uL — ABNORMAL LOW (ref 4.22–5.81)
RDW: 13.7 % (ref 11.5–15.5)
WBC: 6.2 10*3/uL (ref 4.0–10.5)
nRBC: 0 % (ref 0.0–0.2)

## 2018-10-12 LAB — BASIC METABOLIC PANEL
ANION GAP: 12 (ref 5–15)
BUN: 12 mg/dL (ref 6–20)
CO2: 28 mmol/L (ref 22–32)
Calcium: 8.9 mg/dL (ref 8.9–10.3)
Chloride: 101 mmol/L (ref 98–111)
Creatinine, Ser: 0.77 mg/dL (ref 0.61–1.24)
GFR calc Af Amer: >60 mL/min (ref >60)
GFR calc non Af Amer: >60 mL/min (ref >60)
Glucose, Bld: 105 mg/dL — ABNORMAL HIGH (ref 70–99)
Potassium: 4.1 mmol/L (ref 3.5–5.1)
Sodium: 141 mmol/L (ref 135–145)

## 2018-10-12 MED ORDER — CIPROFLOXACIN HCL 500 MG PO TABS: 500.0000 mg | ORAL_TABLET | Freq: Two times a day (BID) | ORAL | 0 refills | Status: DC

## 2018-10-12 MED ORDER — VANCOMYCIN HCL 10 G IV SOLR
1250.0000 mg | Freq: Once | INTRAVENOUS | Status: AC
  Administered 2018-10-12: 1250 mg via INTRAVENOUS
  Filled 2018-10-12: qty 1250

## 2018-10-12 MED ORDER — CEPHALEXIN 500 MG PO CAPS: 500.0000 mg | ORAL_CAPSULE | Freq: Four times a day (QID) | ORAL | 0 refills | Status: DC

## 2018-10-12 MED ORDER — FENTANYL CITRATE (PF) 100 MCG/2ML IJ SOLN
50.0000 ug | Freq: Once | INTRAMUSCULAR | Status: AC
  Administered 2018-10-12: 50 ug via INTRAVENOUS
  Filled 2018-10-12: qty 2

## 2018-10-12 NOTE — ED Notes (Signed)
Patient awoke in the lobby and states that he fell asleep and must not have heard his name called for triage earlier

## 2018-10-12 NOTE — ED Notes (Signed)
Pt reports left foot pain x1 week.

## 2018-10-12 NOTE — ED Triage Notes (Signed)
Patient c/o left foot pain, patient is homeless.

## 2018-10-12 NOTE — ED Provider Notes (Signed)
[facility]   CSN: [id number] Arrival date & time: 10/11/18  2248     History   Chief Complaint Chief Complaint  Patient presents with  . Foot Injury    Left    HPI James BromeCameron R Lawley Jr. is a 33 y.o. male.  Patient presents to the emergency department with a chief complaint of left foot pain.  He states that the pain is been gradually worsening for the past week.  Denies any fevers or chills.  Denies stepping on anything.  Denies any known injury.  Denies taking anything for his symptoms.  The history is provided by the patient. No language interpreter was used.    Past Medical History:  Diagnosis Date  . Eczema     Patient Active Problem List   Diagnosis Date Noted  . Opioid abuse with opioid-induced mood disorder (HCC) 10/02/2018  . Heroin abuse (HCC) 09/29/2018  . Substance induced mood disorder (HCC) 09/29/2018  . Major depressive disorder, recurrent severe without psychotic features (HCC) 09/29/2018    No past surgical history on file.      Home Medications    Prior to Admission medications   Medication Sig Start Date End Date Taking? Authorizing Provider  hydrOXYzine (ATARAX/VISTARIL) 25 MG tablet Take 1 tablet (25 mg total) by mouth every 6 (six) hours as needed for anxiety. 10/02/18   Armandina StammerNwoko, Agnes I, NP  naproxen (NAPROSYN) 500 MG tablet Take 1 tablet (500 mg total) by mouth 2 (two) times daily. (May buy from over the counter): For pain 10/02/18   Armandina StammerNwoko, Agnes I, NP  nicotine (NICODERM CQ - DOSED IN MG/24 HOURS) 21 mg/24hr patch Place 1 patch (21 mg total) onto the skin daily. (May buy from over the counter): For smoking cessation 10/03/18   Armandina StammerNwoko, Agnes I, NP  OLANZapine (ZYPREXA) 2.5 MG tablet Take 1 tablet (2.5 mg total) by mouth at bedtime. For mood control 10/02/18   Armandina StammerNwoko, Agnes I, NP  penicillin v potassium (VEETID) 500 MG tablet Take 1 tablet (500 mg total) by mouth 4 (four) times daily. 10/04/18    Garlon HatchetSanders, Lisa M, PA-C  sertraline (ZOLOFT) 100 MG tablet Take 1 tablet (100 mg total) by mouth daily. For depression 10/03/18   Armandina StammerNwoko, Agnes I, NP  traZODone (DESYREL) 50 MG tablet Take 1 tablet (50 mg total) by mouth at bedtime as needed for sleep. 10/02/18   Sanjuana KavaNwoko, Agnes I, NP    Family History No family history on file.  Social History Social History   Tobacco Use  . Smoking status: Current Every Day Smoker    Packs/day: 1.00    Types: Cigarettes  . Smokeless tobacco: Never Used  Substance Use Topics  . Alcohol use: Not Currently    Comment: twice a month  . Drug use: Yes    Types: Cocaine, Marijuana, Methamphetamines, IV    Comment: heroin     Allergies   Patient has no known allergies.   Review of Systems Review of Systems  All other systems reviewed and are negative.    Physical Exam Updated Vital Signs BP (!) 149/85 (BP Location: Right Arm)   Pulse 84   Temp 98.1 F (36.7 C) (Oral)   Resp 18   SpO2 96%   Physical Exam Vitals signs and nursing note reviewed.  Constitutional:      General: He is not in acute distress.    Appearance: He is not diaphoretic.  HENT:     Head:  Normocephalic and atraumatic.  Eyes:     Conjunctiva/sclera: Conjunctivae normal.     Pupils: Pupils are equal, round, and reactive to light.  Neck:     Trachea: No tracheal deviation.  Cardiovascular:     Rate and Rhythm: Normal rate.  Pulmonary:     Effort: Pulmonary effort is normal. No respiratory distress.  Abdominal:     Palpations: Abdomen is soft.  Musculoskeletal: Normal range of motion.     Comments: Left foot notable for mild swelling around the ball of the foot between the second and third toes with mild erythema and tenderness, no discharge or fluctuance  Skin:    General: Skin is warm and dry.  Neurological:     Mental Status: He is alert and oriented to person, place, and time.  Psychiatric:        Judgment: Judgment normal.      ED Treatments / Results    Labs (all labs ordered are listed, but only abnormal results are displayed) Labs Reviewed  CBC WITH DIFFERENTIAL/PLATELET  BASIC METABOLIC PANEL    EKG None  Radiology No results found.  Procedures Procedures (including critical care time)  Medications Ordered in ED Medications  vancomycin (VANCOCIN) 1,250 mg in sodium chloride 0.9 % 250 mL IVPB (has no administration in time range)  fentaNYL (SUBLIMAZE) injection 50 mcg (has no administration in time range)     Initial Impression / Assessment and Plan / ED Course  I have reviewed the triage vital signs and the nursing notes.  Pertinent labs & imaging results that were available during my care of the patient were reviewed by me and considered in my medical decision making (see chart for details).     With possible developing cellulitis on the ball of his foot.  Denies being diabetic.  Denies any fevers.  Will check labs and imaging.  Anticipate treatment with antibiotics and discharge.  X-ray negative.  Labs reassuring.  Will DC with abx.  Final Clinical Impressions(s) / ED Diagnoses   Final diagnoses:  Cellulitis of left lower extremity    ED Discharge Orders         Ordered    ciprofloxacin (CIPRO) 500 MG tablet  2 times daily     10/12/18 0452    cephALEXin (KEFLEX) 500 MG capsule  4 times daily     10/12/18 0452           Roxy Horseman, PA-C 10/12/18 5537    Shaune Pollack, MD 10/13/18 [phone]

## 2018-10-14 ENCOUNTER — Emergency Department (HOSPITAL_COMMUNITY)
Admission: EM | Admit: 2018-10-14 | Discharge: 2018-10-14 | Disposition: A | Discharge status: Home / Self Care | Payer: Self-pay | Attending: Emergency Medicine | Admitting: Emergency Medicine

## 2018-10-14 ENCOUNTER — Encounter (HOSPITAL_COMMUNITY): Payer: Self-pay | Admitting: Emergency Medicine

## 2018-10-14 DIAGNOSIS — F17210 Nicotine dependence, cigarettes, uncomplicated: Secondary | ICD-10-CM | POA: Insufficient documentation

## 2018-10-14 DIAGNOSIS — F1910 Other psychoactive substance abuse, uncomplicated: Secondary | ICD-10-CM | POA: Insufficient documentation

## 2018-10-14 DIAGNOSIS — Z79899 Other long term (current) drug therapy: Secondary | ICD-10-CM | POA: Insufficient documentation

## 2018-10-14 DIAGNOSIS — B353 Tinea pedis: Secondary | ICD-10-CM | POA: Insufficient documentation

## 2018-10-14 DIAGNOSIS — L03116 Cellulitis of left lower limb: Secondary | ICD-10-CM | POA: Insufficient documentation

## 2018-10-14 DIAGNOSIS — D649 Anemia, unspecified: Secondary | ICD-10-CM | POA: Insufficient documentation

## 2018-10-14 DIAGNOSIS — J029 Acute pharyngitis, unspecified: Secondary | ICD-10-CM | POA: Insufficient documentation

## 2018-10-14 LAB — CBC WITH DIFFERENTIAL/PLATELET
Abs Immature Granulocytes: 0.02 10*3/uL (ref 0.00–0.07)
Basophils Absolute: 0 10*3/uL (ref 0.0–0.1)
Basophils Relative: 0 %
Eosinophils Absolute: 0.2 10*3/uL (ref 0.0–0.5)
Eosinophils Relative: 3 %
HEMATOCRIT: 38.3 % — AB (ref 39.0–52.0)
Hemoglobin: 12 g/dL — ABNORMAL LOW (ref 13.0–17.0)
Immature Granulocytes: 0 %
Lymphocytes Relative: 25 %
Lymphs Abs: 2 10*3/uL (ref 0.7–4.0)
MCH: 29.7 pg (ref 26.0–34.0)
MCHC: 31.3 g/dL (ref 30.0–36.0)
MCV: 94.8 fL (ref 80.0–100.0)
Monocytes Absolute: 0.7 10*3/uL (ref 0.1–1.0)
Monocytes Relative: 8 %
NEUTROS PCT: 64 %
Neutro Abs: 4.9 10*3/uL (ref 1.7–7.7)
Platelets: 271 10*3/uL (ref 150–400)
RBC: 4.04 MIL/uL — ABNORMAL LOW (ref 4.22–5.81)
RDW: 13.4 % (ref 11.5–15.5)
WBC: 7.9 10*3/uL (ref 4.0–10.5)
nRBC: 0 % (ref 0.0–0.2)

## 2018-10-14 LAB — GROUP A STREP BY PCR: Group A Strep by PCR: NOT DETECTED

## 2018-10-14 MED ORDER — DEXAMETHASONE 4 MG PO TABS
10.0000 mg | ORAL_TABLET | Freq: Once | ORAL | Status: AC
  Administered 2018-10-14: 10 mg via ORAL
  Filled 2018-10-14: qty 3

## 2018-10-14 MED ORDER — CEFDINIR 300 MG PO CAPS
600.0000 mg | ORAL_CAPSULE | Freq: Once | ORAL | Status: AC
  Administered 2018-10-14: 600 mg via ORAL
  Filled 2018-10-14: qty 2

## 2018-10-14 NOTE — ED Triage Notes (Signed)
Reports cough and sore throat that started yesterday and cellulitis in left foot.  Was seen two days ago.  Has not been able to get antibiotics filled yet.

## 2018-10-14 NOTE — ED Provider Notes (Signed)
[facility] Provider [facility]   CSN: [id number] Arrival date & time: 10/14/18  0456     History   Chief Complaint Chief Complaint  Patient presents with  . Sore Throat  . Cellulitis    HPI James BromeCameron R Donaghue Jr. is a 33 y.o. male.  The history is provided by the patient.  He has history of depression, substance abuse, eczema and comes in because of ongoing problems with pain in his left foot and also a sore throat.  He had been seen in the ED 2 days ago and prescribed cephalexin and ciprofloxacin but did not get the prescriptions filled because of lack of money.  He states that his mother is wiring him some money tomorrow and he will get the prescriptions filled then.  In the meantime, he developed a sore throat yesterday and is having some pain with swallowing.  He denies fever or chills but has had some sweats.  Past Medical History:  Diagnosis Date  . Eczema     Patient Active Problem List   Diagnosis Date Noted  . Opioid abuse with opioid-induced mood disorder (HCC) 10/02/2018  . Heroin abuse (HCC) 09/29/2018  . Substance induced mood disorder (HCC) 09/29/2018  . Major depressive disorder, recurrent severe without psychotic features (HCC) 09/29/2018    History reviewed. No pertinent surgical history.      Home Medications    Prior to Admission medications   Medication Sig Start Date End Date Taking? Authorizing Provider  cephALEXin (KEFLEX) 500 MG capsule Take 1 capsule (500 mg total) by mouth 4 (four) times daily. 10/12/18   Roxy HorsemanBrowning, Robert, PA-C  ciprofloxacin (CIPRO) 500 MG tablet Take 1 tablet (500 mg total) by mouth 2 (two) times daily. 10/12/18   Roxy HorsemanBrowning, Robert, PA-C  hydrOXYzine (ATARAX/VISTARIL) 25 MG tablet Take 1 tablet (25 mg total) by mouth every 6 (six) hours as needed for anxiety. 10/02/18   Armandina StammerNwoko, Agnes I, NP  naproxen (NAPROSYN) 500 MG tablet Take 1 tablet (500 mg total) by mouth 2 (two) times daily. (May buy from  over the counter): For pain 10/02/18   Armandina StammerNwoko, Agnes I, NP  nicotine (NICODERM CQ - DOSED IN MG/24 HOURS) 21 mg/24hr patch Place 1 patch (21 mg total) onto the skin daily. (May buy from over the counter): For smoking cessation 10/03/18   Armandina StammerNwoko, Agnes I, NP  OLANZapine (ZYPREXA) 2.5 MG tablet Take 1 tablet (2.5 mg total) by mouth at bedtime. For mood control 10/02/18   Armandina StammerNwoko, Agnes I, NP  penicillin v potassium (VEETID) 500 MG tablet Take 1 tablet (500 mg total) by mouth 4 (four) times daily. 10/04/18   Garlon HatchetSanders, Lisa M, PA-C  sertraline (ZOLOFT) 100 MG tablet Take 1 tablet (100 mg total) by mouth daily. For depression 10/03/18   Armandina StammerNwoko, Agnes I, NP  traZODone (DESYREL) 50 MG tablet Take 1 tablet (50 mg total) by mouth at bedtime as needed for sleep. 10/02/18   Sanjuana KavaNwoko, Agnes I, NP    Family History No family history on file.  Social History Social History   Tobacco Use  . Smoking status: Current Every Day Smoker    Packs/day: 1.00    Types: Cigarettes  . Smokeless tobacco: Never Used  Substance Use Topics  . Alcohol use: Not Currently    Comment: twice a month  . Drug use: Yes    Types: Cocaine, Marijuana, Methamphetamines, IV    Comment: heroin     Allergies   Patient has no known  allergies.   Review of Systems Review of Systems  All other systems reviewed and are negative.    Physical Exam Updated Vital Signs BP 140/82 (BP Location: Right Arm)   Pulse 76   Temp 97.7 F (36.5 C) (Oral)   Resp 18   Ht 5\' 9"  (1.753 m)   Wt 68 kg   SpO2 96%   BMI 22.15 kg/m   Physical Exam Vitals signs and nursing note reviewed.    33 year old male, resting comfortably and in no acute distress. Vital signs are normal. Oxygen saturation is 96%, which is normal. Head is normocephalic and atraumatic. PERRLA, EOMI. Oropharynx is mildly erythematous without exudate.  There is no pooling of secretions.  Phonation is normal. Neck is nontender and supple without adenopathy or JVD. Back is  nontender and there is no CVA tenderness. Lungs are clear without rales, wheezes, or rhonchi. Chest is nontender. Heart has regular rate and rhythm without murmur. Abdomen is soft, flat, nontender without masses or hepatosplenomegaly and peristalsis is normoactive. Extremities have no cyanosis or edema, full range of motion is present.  Some cracking of skin is noted in the intertriginous areas between the toes consistent with tinea pedis.  There is mild erythema of the left foot compared with the right.  There is tenderness palpation over the ball of the foot but no significant swelling.  There are no lymphangitic streaks seen. Skin is warm and dry without rash. Neurologic: Mental status is normal, cranial nerves are intact, there are no motor or sensory deficits.  ED Treatments / Results  Labs (all labs ordered are listed, but only abnormal results are displayed) Labs Reviewed  CBC WITH DIFFERENTIAL/PLATELET - Abnormal; Notable for the following components:      Result Value   RBC 4.04 (*)    Hemoglobin 12.0 (*)    HCT 38.3 (*)    All other components within normal limits  GROUP A STREP BY PCR   Procedures Procedures   Medications Ordered in ED Medications  cefdinir (OMNICEF) capsule 600 mg (has no administration in time range)  dexamethasone (DECADRON) tablet 10 mg (10 mg Oral Given 10/14/18 0601)     Initial Impression / Assessment and Plan / ED Course  I have reviewed the triage vital signs and the nursing notes.  Pertinent lab results that were available during my care of the patient were reviewed by me and considered in my medical decision making (see chart for details).  Cellulitis of the left foot secondary to tinea pedis.  This still appears to be a low-grade infection, will check CBC.  Pharyngitis.  Strep screen had been obtained at triage, but he will be on appropriate antibiotics based on his cellulitis.  He is given a dose of dexamethasone for symptomatic treatment of  his sore throat.  Old records are reviewed confirming recent ED visit for cellulitis of the foot, as well as 2 ED visits for dental pain.  CBC shows normal WBC.  Mild anemia is present and unchanged from baseline.  He is given a dose of cefdinir in the ED as this should provide 24-hour coverage until he can get his cephalexin prescription filled.  He is advised to treat his athlete's foot along with the cellulitis.  Return precautions discussed.  Final Clinical Impressions(s) / ED Diagnoses   Final diagnoses:  Cellulitis of left foot  Tinea pedis of both feet  Pharyngitis, unspecified etiology  Normochromic normocytic anemia    ED Discharge Orders  None       Dione BoozeGlick, Krissy Orebaugh, MD 10/14/18 [phone]

## 2018-10-14 NOTE — Discharge Instructions (Addendum)
Use any of the over-the-counter Athlete's Foot medications. Use it diligently for three weeks, or the athlete's foot will come back.

## 2018-10-15 ENCOUNTER — Encounter (HOSPITAL_COMMUNITY): Payer: Self-pay | Admitting: Emergency Medicine

## 2018-10-15 ENCOUNTER — Emergency Department (HOSPITAL_COMMUNITY)
Admission: EM | Admit: 2018-10-15 | Discharge: 2018-10-16 | Disposition: A | Discharge status: Home / Self Care | Payer: Self-pay | Attending: Emergency Medicine | Admitting: Emergency Medicine

## 2018-10-15 ENCOUNTER — Other Ambulatory Visit: Payer: Self-pay

## 2018-10-15 DIAGNOSIS — R2242 Localized swelling, mass and lump, left lower limb: Secondary | ICD-10-CM | POA: Insufficient documentation

## 2018-10-15 DIAGNOSIS — Z5321 Procedure and treatment not carried out due to patient leaving prior to being seen by health care provider: Secondary | ICD-10-CM | POA: Insufficient documentation

## 2018-10-15 NOTE — ED Notes (Signed)
Patient stated that social work gave him 2 bus passes to get to the pharmacy and he was leaving and not waiting on discharge papers.

## 2018-10-15 NOTE — ED Notes (Signed)
Burna Mortimer called in antibiotics and they are paid for at [facility]

## 2018-10-15 NOTE — ED Triage Notes (Signed)
Patient here with foot swelling in left foot.  Patient has not had his antibiotic filled.  He has cellulitis in the left foot.  He got his feet wet today.

## 2018-10-15 NOTE — Care Management (Signed)
ED CM received consult from Triage Nurse concerning patient having several visits to the ED in the past week. Patient is here tonight and reports he has not been able to obtain his antibiotics due to lack funds, patient is homeless.  CM met with patient at bedside. Patient is eligible for [facility] program and co-pay waived. CM provided patient with bus pass to go to the 24 hour [facility] on [address]. Prescriptions was called in along with [facility] letter.  Patient instructed to follow up with [facility] tomorrow.

## 2018-10-16 ENCOUNTER — Encounter (HOSPITAL_COMMUNITY): Payer: Self-pay | Admitting: Emergency Medicine

## 2018-10-16 ENCOUNTER — Emergency Department (HOSPITAL_COMMUNITY)
Admission: EM | Admit: 2018-10-16 | Discharge: 2018-10-16 | Disposition: A | Discharge status: Home / Self Care | Payer: Self-pay | Attending: Emergency Medicine | Admitting: Emergency Medicine

## 2018-10-16 DIAGNOSIS — F17210 Nicotine dependence, cigarettes, uncomplicated: Secondary | ICD-10-CM | POA: Insufficient documentation

## 2018-10-16 DIAGNOSIS — Y33XXXD Other specified events, undetermined intent, subsequent encounter: Secondary | ICD-10-CM | POA: Insufficient documentation

## 2018-10-16 DIAGNOSIS — Z4802 Encounter for removal of sutures: Secondary | ICD-10-CM | POA: Insufficient documentation

## 2018-10-16 DIAGNOSIS — S01112D Laceration without foreign body of left eyelid and periocular area, subsequent encounter: Secondary | ICD-10-CM | POA: Insufficient documentation

## 2018-10-16 DIAGNOSIS — Z79899 Other long term (current) drug therapy: Secondary | ICD-10-CM | POA: Insufficient documentation

## 2018-10-16 NOTE — Discharge Instructions (Addendum)
Please read attached information. If you experience any new or worsening signs or symptoms please return to the emergency room for evaluation. Please follow-up with your primary care provider or specialist as discussed.  °
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
## 2018-10-16 NOTE — ED Triage Notes (Signed)
Pt here for removal of stitch to left eye that was placed 1 month ago.

## 2018-10-16 NOTE — ED Notes (Signed)
Patient continues to come to ED due to not able to get antibiotics.  Burna Mortimer from case management notified.  Patient was leaving after being told that antibiotics were being called in and paid for by case management.

## 2018-10-22 ENCOUNTER — Emergency Department (HOSPITAL_COMMUNITY)
Admission: EM | Admit: 2018-10-22 | Discharge: 2018-10-22 | Disposition: A | Discharge status: Home / Self Care | Payer: Self-pay | Attending: Emergency Medicine | Admitting: Emergency Medicine

## 2018-10-22 ENCOUNTER — Encounter (HOSPITAL_COMMUNITY): Payer: Self-pay | Admitting: *Deleted

## 2018-10-22 DIAGNOSIS — Z79899 Other long term (current) drug therapy: Secondary | ICD-10-CM | POA: Insufficient documentation

## 2018-10-22 DIAGNOSIS — R112 Nausea with vomiting, unspecified: Secondary | ICD-10-CM | POA: Insufficient documentation

## 2018-10-22 DIAGNOSIS — M545 Low back pain, unspecified: Secondary | ICD-10-CM

## 2018-10-22 DIAGNOSIS — F17210 Nicotine dependence, cigarettes, uncomplicated: Secondary | ICD-10-CM | POA: Insufficient documentation

## 2018-10-22 LAB — URINALYSIS, ROUTINE W REFLEX MICROSCOPIC
Bilirubin Urine: NEGATIVE
Glucose, UA: NEGATIVE mg/dL
Hgb urine dipstick: NEGATIVE
KETONES UR: NEGATIVE mg/dL
Leukocytes,Ua: NEGATIVE
Nitrite: NEGATIVE
PH: 6 (ref 5.0–8.0)
Protein, ur: NEGATIVE mg/dL
Specific Gravity, Urine: 1.027 (ref 1.005–1.030)

## 2018-10-22 MED ORDER — IBUPROFEN 800 MG PO TABS
800.0000 mg | ORAL_TABLET | Freq: Once | ORAL | Status: AC
  Administered 2018-10-22: 800 mg via ORAL
  Filled 2018-10-22: qty 1

## 2018-10-22 MED ORDER — ONDANSETRON 4 MG PO TBDP: 4.0000 mg | ORAL_TABLET | Freq: Three times a day (TID) | ORAL | 0 refills | Status: DC | PRN

## 2018-10-22 MED ORDER — ONDANSETRON 4 MG PO TBDP
4.0000 mg | ORAL_TABLET | Freq: Once | ORAL | Status: AC
  Administered 2018-10-22: 4 mg via ORAL
  Filled 2018-10-22: qty 1

## 2018-10-22 NOTE — ED Provider Notes (Signed)
[name] EMERGENCY DEPARTMENT Provider Note   CSN: [id number] Arrival date & time: 10/22/18  8828     History   Chief Complaint Chief Complaint  Patient presents with  . Emesis    HPI James Beard. is a 33 y.o. male.  Patient with history of homelessness, 13 ED visits in the past 6 months, 10 in the past 5 weeks --presents to the emergency department with complaint of vomiting and right flank pain starting during the night.  Patient states that James Beard has a history of kidney stones.  James Beard states that James Beard had several episodes of vomiting.  No abdominal pain.  James Beard states that James Beard has been having some trouble urinating but is unable to elaborate.  No diarrhea.  No sick contacts but James Beard states that James Beard is homeless and is around a lot of people.  No treatments prior to arrival.     Past Medical History:  Diagnosis Date  . Eczema     Patient Active Problem List   Diagnosis Date Noted  . Opioid abuse with opioid-induced mood disorder (HCC) 10/02/2018  . Heroin abuse (HCC) 09/29/2018  . Substance induced mood disorder (HCC) 09/29/2018  . Major depressive disorder, recurrent severe without psychotic features (HCC) 09/29/2018    History reviewed. No pertinent surgical history.      Home Medications    Prior to Admission medications   Medication Sig Start Date End Date Taking? Authorizing Provider  cephALEXin (KEFLEX) 500 MG capsule Take 1 capsule (500 mg total) by mouth 4 (four) times daily. 10/12/18   Roxy Horseman, PA-C  ciprofloxacin (CIPRO) 500 MG tablet Take 1 tablet (500 mg total) by mouth 2 (two) times daily. 10/12/18   Roxy Horseman, PA-C  hydrOXYzine (ATARAX/VISTARIL) 25 MG tablet Take 1 tablet (25 mg total) by mouth every 6 (six) hours as needed for anxiety. 10/02/18   Armandina Stammer I, NP  naproxen (NAPROSYN) 500 MG tablet Take 1 tablet (500 mg total) by mouth 2 (two) times daily. (May buy from over the counter): For pain 10/02/18   Armandina Stammer I,  NP  nicotine (NICODERM CQ - DOSED IN MG/24 HOURS) 21 mg/24hr patch Place 1 patch (21 mg total) onto the skin daily. (May buy from over the counter): For smoking cessation 10/03/18   Armandina Stammer I, NP  OLANZapine (ZYPREXA) 2.5 MG tablet Take 1 tablet (2.5 mg total) by mouth at bedtime. For mood control 10/02/18   Armandina Stammer I, NP  penicillin v potassium (VEETID) 500 MG tablet Take 1 tablet (500 mg total) by mouth 4 (four) times daily. 10/04/18   Garlon Hatchet, PA-C  sertraline (ZOLOFT) 100 MG tablet Take 1 tablet (100 mg total) by mouth daily. For depression 10/03/18   Armandina Stammer I, NP  traZODone (DESYREL) 50 MG tablet Take 1 tablet (50 mg total) by mouth at bedtime as needed for sleep. 10/02/18   Sanjuana Kava, NP    Family History History reviewed. No pertinent family history.  Social History Social History   Tobacco Use  . Smoking status: Current Every Day Smoker    Packs/day: 1.00    Types: Cigarettes  . Smokeless tobacco: Never Used  Substance Use Topics  . Alcohol use: Not Currently    Comment: twice a month  . Drug use: Yes    Types: Cocaine, Marijuana, Methamphetamines, IV    Comment: heroin     Allergies   Patient has no known allergies.   Review of Systems  Review of Systems  Constitutional: Negative for fever.  HENT: Negative for rhinorrhea and sore throat.   Eyes: Negative for redness.  Respiratory: Negative for cough.   Cardiovascular: Negative for chest pain.  Gastrointestinal: Positive for nausea and vomiting. Negative for abdominal pain and diarrhea.  Genitourinary: Positive for difficulty urinating and flank pain. Negative for dysuria.  Musculoskeletal: Positive for back pain. Negative for myalgias.  Skin: Negative for rash.  Neurological: Negative for headaches.     Physical Exam Updated Vital Signs BP 118/77 (BP Location: Right Arm)   Pulse 99   Temp 97.9 F (36.6 C) (Oral)   Resp 16   Ht 5\' 9"  (1.753 m)   Wt 72.6 kg   SpO2 100%   BMI 23.63  kg/m   Physical Exam Vitals signs and nursing note reviewed.  Constitutional:      Appearance: James Beard is well-developed.  HENT:     Head: Normocephalic and atraumatic.     Mouth/Throat:     Mouth: Mucous membranes are moist.  Eyes:     General:        Right eye: No discharge.        Left eye: No discharge.     Conjunctiva/sclera: Conjunctivae normal.  Neck:     Musculoskeletal: Normal range of motion and neck supple.  Cardiovascular:     Rate and Rhythm: Normal rate and regular rhythm.     Heart sounds: Normal heart sounds.  Pulmonary:     Effort: Pulmonary effort is normal.     Breath sounds: Normal breath sounds.  Abdominal:     Palpations: Abdomen is soft.     Tenderness: There is no abdominal tenderness. There is right CVA tenderness. There is no left CVA tenderness, guarding or rebound.  Musculoskeletal:        General: No swelling or tenderness.  Skin:    General: Skin is warm and dry.  Neurological:     Mental Status: James Beard is alert.      ED Treatments / Results  Labs (all labs ordered are listed, but only abnormal results are displayed) Labs Reviewed  URINALYSIS, ROUTINE W REFLEX MICROSCOPIC - Abnormal; Notable for the following components:      Result Value   Color, Urine AMBER (*)    All other components within normal limits    EKG None  Radiology No results found.  Procedures Procedures (including critical care time)  Medications Ordered in ED Medications  ondansetron (ZOFRAN-ODT) disintegrating tablet 4 mg (4 mg Oral Given 10/22/18 0800)  ibuprofen (ADVIL,MOTRIN) tablet 800 mg (800 mg Oral Given 10/22/18 0800)     Initial Impression / Assessment and Plan / ED Course  I have reviewed the triage vital signs and the nursing notes.  Pertinent labs & imaging results that were available during my care of the patient were reviewed by me and considered in my medical decision making (see chart for details).     Patient seen and examined.  No distress  noted.  Will give ibuprofen and Zofran.  Will check UA.  Vital signs reviewed and are as follows: BP 118/77 (BP Location: Right Arm)   Pulse 99   Temp 97.9 F (36.6 C) (Oral)   Resp 16   Ht 5\' 9"  (1.753 m)   Wt 72.6 kg   SpO2 100%   BMI 23.63 kg/m   9:33 AM UA clear.  Patient drinking ginger ale.  Patient reassessed.  Appears comfortable.  James Beard is up walking in the room.  Request additional ginger ale.  Discussed that kidney stone is less likely, however possible.  Given that his symptoms are controlled, I do not feel that James Beard requires any additional work-up at this time.  James Beard will be discharged with Zofran.  Encouraged use of ibuprofen or Tylenol for pain.  The patient was urged to return to the Emergency Department immediately with worsening of current symptoms, worsening abdominal pain, persistent vomiting, blood noted in stools, fever, or any other concerns. The patient verbalized understanding.    Final Clinical Impressions(s) / ED Diagnoses   Final diagnoses:  Acute right-sided low back pain without sciatica  Non-intractable vomiting with nausea, unspecified vomiting type   Patient with right lower back pain and vomiting.  Abdomen is soft and nontender on exam.  Urine does not show any signs of blood or infection.  James Beard does have a history of renal stones.  Ureteral colic is possible.  James Beard is drinking well after Zofran and requesting additional fluids.  James Beard has not developed any anterior abdominal pain and I do not feel that lab work-up or advanced imaging is indicated at this time.  Return instructions as above.  ED Discharge Orders         Ordered    ondansetron (ZOFRAN ODT) 4 MG disintegrating tablet  Every 8 hours PRN     10/22/18 0924           Renne CriglerGeiple, Tawyna Pellot, PA-C 10/22/18 0935    Alvira MondaySchlossman, Erin, MD 10/23/18 1109

## 2018-10-22 NOTE — Discharge Instructions (Signed)
Please read and follow all provided instructions.  Your diagnoses today include:  1. Acute right-sided low back pain without sciatica   2. Non-intractable vomiting with nausea, unspecified vomiting type    Tests performed today include:  Urine test - normal, no blood or signs of infection  Vital signs. See below for your results today.   Medications prescribed:   Zofran (ondansetron) - for nausea and vomiting  Take any prescribed medications only as directed.  Home care instructions:  Follow any educational materials contained in this packet.  BE VERY CAREFUL not to take multiple medicines containing Tylenol (also called acetaminophen). Doing so can lead to an overdose which can damage your liver and cause liver failure and possibly death.   Follow-up instructions: Please follow-up with your primary care provider in the next 3 days for further evaluation of your symptoms.   Return instructions:   Please return to the Emergency Department if you experience worsening symptoms.   Please return if you have any other emergent concerns.  Additional Information:  Your vital signs today were: BP 118/77 (BP Location: Right Arm)    Pulse 99    Temp 97.9 F (36.6 C) (Oral)    Resp 16    Ht 5\' 9"  (1.753 m)    Wt 72.6 kg    SpO2 100%    BMI 23.63 kg/m  If your blood pressure (BP) was elevated above 135/85 this visit, please have this repeated by your doctor within one month. --------------

## 2018-10-22 NOTE — ED Triage Notes (Signed)
Pt in c/o n/v since last night, c/o lower back pain as well, no distress noted

## 2018-10-24 ENCOUNTER — Emergency Department (HOSPITAL_COMMUNITY): Payer: Self-pay

## 2018-10-24 ENCOUNTER — Encounter (HOSPITAL_COMMUNITY): Payer: Self-pay

## 2018-10-24 ENCOUNTER — Other Ambulatory Visit: Payer: Self-pay

## 2018-10-24 ENCOUNTER — Emergency Department (HOSPITAL_COMMUNITY)
Admission: EM | Admit: 2018-10-24 | Discharge: 2018-10-24 | Discharge status: Against Medical Advice | Payer: Self-pay | Attending: Emergency Medicine | Admitting: Emergency Medicine

## 2018-10-24 DIAGNOSIS — Z5321 Procedure and treatment not carried out due to patient leaving prior to being seen by health care provider: Secondary | ICD-10-CM | POA: Insufficient documentation

## 2018-10-24 NOTE — ED Notes (Signed)
Called for Patient 2X no answer.

## 2018-10-24 NOTE — ED Notes (Signed)
Patient was sleeping when we called for him earlier to get v/s. He woke up came to tell me he was leaving, Because he has to go get his dose across the street.
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
## 2018-10-29 ENCOUNTER — Other Ambulatory Visit: Payer: Self-pay

## 2018-10-29 ENCOUNTER — Emergency Department (HOSPITAL_COMMUNITY)
Admission: EM | Admit: 2018-10-29 | Discharge: 2018-10-30 | Disposition: A | Discharge status: Home / Self Care | Payer: Self-pay | Attending: Emergency Medicine | Admitting: Emergency Medicine

## 2018-10-29 DIAGNOSIS — Z79899 Other long term (current) drug therapy: Secondary | ICD-10-CM | POA: Insufficient documentation

## 2018-10-29 DIAGNOSIS — R21 Rash and other nonspecific skin eruption: Secondary | ICD-10-CM | POA: Insufficient documentation

## 2018-10-29 DIAGNOSIS — M25532 Pain in left wrist: Secondary | ICD-10-CM | POA: Insufficient documentation

## 2018-10-29 DIAGNOSIS — F17210 Nicotine dependence, cigarettes, uncomplicated: Secondary | ICD-10-CM | POA: Insufficient documentation

## 2018-10-29 DIAGNOSIS — W19XXXA Unspecified fall, initial encounter: Secondary | ICD-10-CM

## 2018-10-29 NOTE — ED Triage Notes (Signed)
Pt here for a fall 5 days ago and now having increased left wrist pain. A&Ox4 ambulatory to triage.

## 2018-10-30 ENCOUNTER — Emergency Department (HOSPITAL_COMMUNITY): Payer: Self-pay

## 2018-10-30 ENCOUNTER — Encounter (HOSPITAL_COMMUNITY): Payer: Self-pay | Admitting: Student

## 2018-10-30 MED ORDER — MUPIROCIN 2 % EX OINT: 1.0000 "application " | TOPICAL_OINTMENT | Freq: Two times a day (BID) | CUTANEOUS | 0 refills | Status: DC

## 2018-10-30 MED ORDER — NAPROXEN 500 MG PO TABS: 500.0000 mg | ORAL_TABLET | Freq: Two times a day (BID) | ORAL | 0 refills | Status: DC

## 2018-10-30 NOTE — Discharge Instructions (Addendum)
Please read and follow all provided instructions.  You have been seen today for an injury to the left wrist and a rash  Regarding your rash: We are treating this as a possible skin infection with a topical antibiotic, mupirocin, please apply twice per day for the next 7 days.  Tests performed today include: An x-ray of the affected area - does NOT show any broken bones or dislocations.  Vital signs. See below for your results today.   Home care instructions: -- *PRICE in the first 24-48 hours after injury: Protect (with brace, splint, sling), if given by your provider Rest Ice- Do not apply ice pack directly to your skin, place towel or similar between your skin and ice/ice pack. Apply ice for 20 min, then remove for 40 min while awake Compression- Wear brace, elastic bandage, splint as directed by your provider Elevate affected extremity above the level of your heart when not walking around for the first 24-48 hours   Medications:  - Naproxen is a nonsteroidal anti-inflammatory medication that will help with pain and swelling. Be sure to take this medication as prescribed with food, 1 pill every 12 hours,  It should be taken with food, as it can cause stomach upset, and more seriously, stomach bleeding. Do not take other nonsteroidal anti-inflammatory medications with this such as Advil, Motrin, Aleve, Mobic, Goodie Powder, or Motrin.    - Mupirocin: As described above for rash. You make take Tylenol per over the counter dosing with these medications.   We have prescribed you new medication(s) today. Discuss the medications prescribed today with your pharmacist as they can have adverse effects and interactions with your other medicines including over the counter and prescribed medications. Seek medical evaluation if you start to experience new or abnormal symptoms after taking one of these medicines, seek care immediately if you start to experience difficulty breathing, feeling of your throat  closing, facial swelling, or rash as these could be indications of a more serious allergic reaction  Follow-up instructions: Please follow-up with your primary care provider within 3 to 5 days for reevaluation of your wrist discomfort as well as your rash.  .   Return instructions:  Please return if your digits or extremity are numb or tingling, appear gray or blue, or you have severe pain (also elevate the extremity and loosen splint or wrap if you were given one) Please return if you have redness or fevers. Please return if your rash spreads or worsens. Please return to the Emergency Department if you experience worsening symptoms.  Please return if you have any other emergent concerns. Additional Information:  Your vital signs today were: BP 136/82 (BP Location: Right Arm)    Pulse 81    Temp 98.3 F (36.8 C) (Oral)    Resp 20    SpO2 100%  If your blood pressure (BP) was elevated above 135/85 this visit, please have this repeated by your doctor within one month. ---------------
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
## 2018-10-30 NOTE — ED Notes (Signed)
E-signature not available, verbalized understanding of DC instructions and prescriptions.  

## 2018-11-01 ENCOUNTER — Emergency Department (HOSPITAL_COMMUNITY): Payer: Self-pay

## 2018-11-01 ENCOUNTER — Encounter (HOSPITAL_COMMUNITY): Payer: Self-pay | Admitting: Emergency Medicine

## 2018-11-01 ENCOUNTER — Emergency Department (HOSPITAL_COMMUNITY)
Admission: EM | Admit: 2018-11-01 | Discharge: 2018-11-01 | Disposition: A | Discharge status: Home / Self Care | Payer: Self-pay | Attending: Emergency Medicine | Admitting: Emergency Medicine

## 2018-11-01 ENCOUNTER — Other Ambulatory Visit: Payer: Self-pay

## 2018-11-01 DIAGNOSIS — M545 Low back pain: Secondary | ICD-10-CM | POA: Insufficient documentation

## 2018-11-01 DIAGNOSIS — M546 Pain in thoracic spine: Secondary | ICD-10-CM | POA: Insufficient documentation

## 2018-11-01 DIAGNOSIS — R079 Chest pain, unspecified: Secondary | ICD-10-CM | POA: Insufficient documentation

## 2018-11-01 DIAGNOSIS — F17200 Nicotine dependence, unspecified, uncomplicated: Secondary | ICD-10-CM | POA: Insufficient documentation

## 2018-11-01 DIAGNOSIS — Z789 Other specified health status: Secondary | ICD-10-CM

## 2018-11-01 HISTORY — DX: Bipolar disorder, unspecified: F31.9

## 2018-11-01 LAB — COMPREHENSIVE METABOLIC PANEL
ALBUMIN: 3.4 g/dL — AB (ref 3.5–5.0)
ALT: 150 U/L — ABNORMAL HIGH (ref 0–44)
AST: 110 U/L — ABNORMAL HIGH (ref 15–41)
Alkaline Phosphatase: 125 U/L (ref 38–126)
Anion gap: 7 (ref 5–15)
BUN: 13 mg/dL (ref 6–20)
CO2: 28 mmol/L (ref 22–32)
Calcium: 8.8 mg/dL — ABNORMAL LOW (ref 8.9–10.3)
Chloride: 105 mmol/L (ref 98–111)
Creatinine, Ser: 0.85 mg/dL (ref 0.61–1.24)
GFR calc non Af Amer: >60 mL/min (ref >60)
Glucose, Bld: 84 mg/dL (ref 70–99)
Potassium: 3.8 mmol/L (ref 3.5–5.1)
Sodium: 140 mmol/L (ref 135–145)
Total Bilirubin: 0.4 mg/dL (ref 0.3–1.2)
Total Protein: 6 g/dL — ABNORMAL LOW (ref 6.5–8.1)

## 2018-11-01 LAB — CBC WITH DIFFERENTIAL/PLATELET
Abs Immature Granulocytes: 0.01 10*3/uL (ref 0.00–0.07)
Basophils Absolute: 0 10*3/uL (ref 0.0–0.1)
Basophils Relative: 0 %
Eosinophils Absolute: 0.1 10*3/uL (ref 0.0–0.5)
Eosinophils Relative: 2 %
HCT: 38.2 % — ABNORMAL LOW (ref 39.0–52.0)
Hemoglobin: 12.1 g/dL — ABNORMAL LOW (ref 13.0–17.0)
Immature Granulocytes: 0 %
Lymphocytes Relative: 36 %
Lymphs Abs: 2.1 10*3/uL (ref 0.7–4.0)
MCH: 29.4 pg (ref 26.0–34.0)
MCHC: 31.7 g/dL (ref 30.0–36.0)
MCV: 92.7 fL (ref 80.0–100.0)
Monocytes Absolute: 0.5 10*3/uL (ref 0.1–1.0)
Monocytes Relative: 9 %
Neutro Abs: 3 10*3/uL (ref 1.7–7.7)
Neutrophils Relative %: 53 %
Platelets: 270 10*3/uL (ref 150–400)
RBC: 4.12 MIL/uL — ABNORMAL LOW (ref 4.22–5.81)
RDW: 12.8 % (ref 11.5–15.5)
WBC: 5.8 10*3/uL (ref 4.0–10.5)
nRBC: 0 % (ref 0.0–0.2)

## 2018-11-01 MED ORDER — FENTANYL CITRATE (PF) 100 MCG/2ML IJ SOLN
50.0000 ug | Freq: Once | INTRAMUSCULAR | Status: AC
  Administered 2018-11-01: 50 ug via INTRAVENOUS
  Filled 2018-11-01: qty 2

## 2018-11-01 MED ORDER — IOHEXOL 300 MG/ML  SOLN
100.0000 mL | Freq: Once | INTRAMUSCULAR | Status: AC | PRN
  Administered 2018-11-01: 100 mL via INTRAVENOUS

## 2018-11-01 MED ORDER — FENTANYL CITRATE (PF) 100 MCG/2ML IJ SOLN
INTRAMUSCULAR | Status: AC
  Filled 2018-11-01: qty 2

## 2018-11-01 MED ORDER — FENTANYL CITRATE (PF) 100 MCG/2ML IJ SOLN
INTRAMUSCULAR | Status: AC | PRN
  Administered 2018-11-01: 100 ug via INTRAVENOUS

## 2018-11-01 NOTE — ED Notes (Signed)
Portable XR at bedside

## 2018-11-01 NOTE — ED Notes (Signed)
Pt returned from CT.  Pt c/o pain rate of 10 on pain scale 0/10
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
## 2018-11-01 NOTE — ED Notes (Signed)
Patient transported to CT scan . 

## 2018-11-02 ENCOUNTER — Encounter (HOSPITAL_COMMUNITY): Payer: Self-pay | Admitting: Student

## 2018-12-04 ENCOUNTER — Other Ambulatory Visit: Payer: Self-pay

## 2018-12-04 ENCOUNTER — Emergency Department (HOSPITAL_COMMUNITY)
Admission: EM | Admit: 2018-12-04 | Discharge: 2018-12-04 | Disposition: A | Discharge status: Home / Self Care | Payer: Self-pay | Attending: Emergency Medicine | Admitting: Emergency Medicine

## 2018-12-04 ENCOUNTER — Encounter (HOSPITAL_COMMUNITY): Payer: Self-pay

## 2018-12-04 DIAGNOSIS — F1110 Opioid abuse, uncomplicated: Secondary | ICD-10-CM | POA: Insufficient documentation

## 2018-12-04 DIAGNOSIS — M79672 Pain in left foot: Secondary | ICD-10-CM | POA: Insufficient documentation

## 2018-12-04 DIAGNOSIS — M79671 Pain in right foot: Secondary | ICD-10-CM | POA: Insufficient documentation

## 2018-12-04 DIAGNOSIS — F319 Bipolar disorder, unspecified: Secondary | ICD-10-CM | POA: Insufficient documentation

## 2018-12-04 DIAGNOSIS — Z79899 Other long term (current) drug therapy: Secondary | ICD-10-CM | POA: Insufficient documentation

## 2018-12-04 DIAGNOSIS — R21 Rash and other nonspecific skin eruption: Secondary | ICD-10-CM | POA: Insufficient documentation

## 2018-12-04 DIAGNOSIS — F17200 Nicotine dependence, unspecified, uncomplicated: Secondary | ICD-10-CM | POA: Insufficient documentation

## 2018-12-04 LAB — COMPREHENSIVE METABOLIC PANEL
ALT: 36 U/L (ref 0–44)
AST: 34 U/L (ref 15–41)
Albumin: 3.9 g/dL (ref 3.5–5.0)
Alkaline Phosphatase: 140 U/L — ABNORMAL HIGH (ref 38–126)
Anion gap: 10 (ref 5–15)
BUN: 14 mg/dL (ref 6–20)
CO2: 27 mmol/L (ref 22–32)
Calcium: 8.9 mg/dL (ref 8.9–10.3)
Chloride: 101 mmol/L (ref 98–111)
Creatinine, Ser: 0.7 mg/dL (ref 0.61–1.24)
GFR calc Af Amer: >60 mL/min (ref >60)
GFR calc non Af Amer: >60 mL/min (ref >60)
Glucose, Bld: 86 mg/dL (ref 70–99)
Potassium: 3.3 mmol/L — ABNORMAL LOW (ref 3.5–5.1)
Sodium: 138 mmol/L (ref 135–145)
Total Bilirubin: 0.5 mg/dL (ref 0.3–1.2)
Total Protein: 7 g/dL (ref 6.5–8.1)

## 2018-12-04 LAB — CBC WITH DIFFERENTIAL/PLATELET
Abs Immature Granulocytes: 0.01 10*3/uL (ref 0.00–0.07)
BASOS PCT: 1 %
Basophils Absolute: 0.1 10*3/uL (ref 0.0–0.1)
Eosinophils Absolute: 0.4 10*3/uL (ref 0.0–0.5)
Eosinophils Relative: 7 %
HCT: 40.5 % (ref 39.0–52.0)
Hemoglobin: 13.2 g/dL (ref 13.0–17.0)
Immature Granulocytes: 0 %
Lymphocytes Relative: 29 %
Lymphs Abs: 1.8 10*3/uL (ref 0.7–4.0)
MCH: 29.8 pg (ref 26.0–34.0)
MCHC: 32.6 g/dL (ref 30.0–36.0)
MCV: 91.4 fL (ref 80.0–100.0)
Monocytes Absolute: 0.6 10*3/uL (ref 0.1–1.0)
Monocytes Relative: 10 %
NEUTROS ABS: 3.4 10*3/uL (ref 1.7–7.7)
Neutrophils Relative %: 53 %
PLATELETS: 320 10*3/uL (ref 150–400)
RBC: 4.43 MIL/uL (ref 4.22–5.81)
RDW: 12.3 % (ref 11.5–15.5)
WBC: 6.3 10*3/uL (ref 4.0–10.5)
nRBC: 0 % (ref 0.0–0.2)

## 2018-12-04 MED ORDER — DOXYCYCLINE HYCLATE 100 MG PO CAPS: 100.0000 mg | ORAL_CAPSULE | Freq: Two times a day (BID) | ORAL | 0 refills | Status: DC

## 2018-12-04 MED ORDER — CEPHALEXIN 500 MG PO CAPS: 500.0000 mg | ORAL_CAPSULE | Freq: Four times a day (QID) | ORAL | 0 refills | Status: AC

## 2018-12-04 MED ORDER — DIPHENHYDRAMINE HCL 25 MG PO TABS: 25.0000 mg | ORAL_TABLET | Freq: Four times a day (QID) | ORAL | 0 refills | Status: DC

## 2018-12-04 NOTE — ED Notes (Signed)
Patient given coke and sandwhich.

## 2018-12-04 NOTE — ED Triage Notes (Signed)
Patient c/o rash and swelling of his feet. Patient states he was told 2 months ago that he had cellulitis of both feet. Patient states he is homeless and is on his feet a lot.  Patient also has a rash on neck, arms, back and abdomen x 2 weeks

## 2018-12-04 NOTE — ED Provider Notes (Signed)
[facility]-EMERGENCY DEPT Provider Note   CSN: [id number] Arrival date & time: 12/04/18  1629    History   Chief Complaint Chief Complaint  Patient presents with  . Rash  . Foot Swelling    HPI James Gracy. is a 33 y.o. male with a PMH of Bipolar 1 Disorder and eczema presenting with intermittent bilateral feet edema and pain onset 1 week ago. Patient states pain is worse at ball of feet bilaterally. Patient states pain is worse with ambulation and better with rest. Patient denies taking any medications. Patient describes pain as a burning. Patient denies erythema, fever, nausea, vomiting, diarrhea, or abdominal pain. Patient denies injury. Patient states he had cellulitis on his left foot about 2 months ago. Patient reports he is homeless and has to ambulate often which makes pain worse. Patient denies ankle or leg pain.   Patient also reports a constant pruritic rash onset 2 weeks ago. Patient reports rash started on arms and has spread throughout body. Patient denies anything making rash better or worse. Patient denies taking any medications. Patient reports rash is pruritic. Patient denies anyone else with a similar rash. Patient denies environmental, food, or drug allergies. Patient denies taking any new medications. Patient denies any insect bites. Patient reports tobacco and drug use including heroin and meth. Patient denies alcohol use.     HPI  Past Medical History:  Diagnosis Date  . Bipolar 1 disorder (HCC)   . Eczema     Patient Active Problem List   Diagnosis Date Noted  . Opioid abuse with opioid-induced mood disorder (HCC) 10/02/2018  . Heroin abuse (HCC) 09/29/2018  . Substance induced mood disorder (HCC) 09/29/2018  . Major depressive disorder, recurrent severe without psychotic features (HCC) 09/29/2018    History reviewed. No pertinent surgical history.      Home Medications    Prior to Admission medications   Medication  Sig Start Date End Date Taking? Authorizing Provider  methocarbamol (ROBAXIN) 750 MG tablet Take 1,500 mg by mouth 2 (two) times daily as needed for muscle spasms.   Yes [provider]  OLANZapine (ZYPREXA) 2.5 MG tablet Take 1 tablet (2.5 mg total) by mouth at bedtime. For mood control 10/02/18  Yes Armandina Stammer I, NP  sertraline (ZOLOFT) 100 MG tablet Take 1 tablet (100 mg total) by mouth daily. For depression 10/03/18  Yes Armandina Stammer I, NP  traZODone (DESYREL) 50 MG tablet Take 1 tablet (50 mg total) by mouth at bedtime as needed for sleep. Patient taking differently: Take 50 mg by mouth at bedtime.  10/02/18  Yes Armandina Stammer I, NP  cephALEXin (KEFLEX) 500 MG capsule Take 1 capsule (500 mg total) by mouth 4 (four) times daily for 5 days. 12/04/18 12/09/18  Carlyle Basques P, PA-C  diphenhydrAMINE (BENADRYL) 25 MG tablet Take 1 tablet (25 mg total) by mouth every 6 (six) hours. 12/04/18   Carlyle Basques P, PA-C  doxycycline (VIBRAMYCIN) 100 MG capsule Take 1 capsule (100 mg total) by mouth 2 (two) times daily. 12/04/18   Carlyle Basques P, PA-C  mupirocin ointment (BACTROBAN) 2 % Place 1 application into the nose 2 (two) times daily. Patient not taking: Reported on 12/04/2018 10/30/18   Petrucelli, Lelon Mast R, PA-C  naproxen (NAPROSYN) 500 MG tablet Take 1 tablet (500 mg total) by mouth 2 (two) times daily. Patient not taking: Reported on 12/04/2018 10/30/18   Petrucelli, Lelon Mast R, PA-C  ondansetron (ZOFRAN ODT) 4 MG disintegrating tablet Take  1 tablet (4 mg total) by mouth every 8 (eight) hours as needed for nausea or vomiting. Patient not taking: Reported on 12/04/2018 10/22/18   Renne Crigler, PA-C    Family History Family History  Problem Relation Age of Onset  . Diabetes Mother   . Hypertension Mother   . Depression Mother   . Diabetes Father   . Hypertension Father   . Depression Father     Social History Social History   Tobacco Use  . Smoking status: Current Every Day Smoker     Packs/day: 1.00    Types: Cigarettes  . Smokeless tobacco: Never Used  Substance Use Topics  . Alcohol use: Not Currently  . Drug use: Not Currently    Types: Cocaine, Marijuana, Methamphetamines, IV    Comment: heroin  Patient states he goes to the Methadone clinic     Allergies   Patient has no known allergies.   Review of Systems Review of Systems  Constitutional: Negative for chills, diaphoresis and fever.  HENT: Negative for congestion, facial swelling, rhinorrhea, sore throat, trouble swallowing and voice change.   Eyes: Negative for photophobia and visual disturbance.  Respiratory: Negative for cough and shortness of breath.   Cardiovascular: Negative for chest pain.  Gastrointestinal: Negative for abdominal pain, nausea and vomiting.  Endocrine: Negative for cold intolerance and heat intolerance.  Musculoskeletal: Positive for arthralgias. Negative for back pain, neck pain and neck stiffness.  Skin: Positive for rash and wound.  Allergic/Immunologic: Negative for environmental allergies, food allergies and immunocompromised state.  Neurological: Negative for headaches.  Hematological: Negative for adenopathy.    Physical Exam Updated Vital Signs BP 114/63 (BP Location: Right Arm)   Pulse 68   Temp 98.3 F (36.8 C) (Oral)   Resp 16   Ht  (1.753 m)   Wt 74.8 kg   SpO2 100%   BMI 24.37 kg/m   Physical Exam Vitals signs and nursing note reviewed.  Constitutional:      General: He is not in acute distress.    Appearance: He is well-developed. He is not diaphoretic.  HENT:     Head: Normocephalic and atraumatic.     Mouth/Throat:     Mouth: Mucous membranes are moist.     Pharynx: No posterior oropharyngeal erythema.  Neck:     Musculoskeletal: Normal range of motion.  Cardiovascular:     Rate and Rhythm: Normal rate and regular rhythm.     Pulses:          Dorsalis pedis pulses are 2+ on the right side and 2+ on the left side.     Heart sounds:  Normal heart sounds. No murmur. No friction rub. No gallop.   Pulmonary:     Effort: Pulmonary effort is normal. No respiratory distress.     Breath sounds: Normal breath sounds. No wheezing or rales.     Comments: Patient is speaking in full sentences without difficulty.  Abdominal:     Palpations: Abdomen is soft.     Tenderness: There is no abdominal tenderness.  Musculoskeletal: Normal range of motion.     Right knee: Normal. He exhibits normal range of motion and no swelling. No tenderness found.     Left knee: Normal. He exhibits normal range of motion and no swelling. No tenderness found.     Right ankle: Normal. He exhibits normal range of motion and no swelling.     Left ankle: Normal. He exhibits normal range of motion and no  swelling.     Right lower leg: No edema.     Left lower leg: He exhibits no tenderness and no bony tenderness. No edema.     Right foot: Normal range of motion. Tenderness present. No bony tenderness.     Left foot: Normal range of motion. Tenderness present. No bony tenderness.       Feet:  Feet:     Right foot:     Skin integrity: Blister present.     Left foot:     Skin integrity: Blister present.  Skin:    General: Skin is warm.     Findings: Erythema, rash and wound (Pt has a wound on the left palmar aspect of hand. Patient also has a wound over the right antecubital fossa.) present. Rash is papular.          Comments: Erythematous rash noted over neck, arms, and right thigh.   Neurological:     Mental Status: He is alert.      ED Treatments / Results  Labs (all labs ordered are listed, but only abnormal results are displayed) Labs Reviewed  COMPREHENSIVE METABOLIC PANEL - Abnormal; Notable for the following components:      Result Value   Potassium 3.3 (*)    Alkaline Phosphatase 140 (*)    All other components within normal limits  CBC WITH DIFFERENTIAL/PLATELET    EKG EKG Interpretation  Date/Time:  Tuesday December 04 2018  18:40:14 EDT Ventricular Rate:  62 PR Interval:    QRS Duration: 98 QT Interval:  398 QTC Calculation: 405 R Axis:   83 Text Interpretation:  Sinus rhythm No acute changes No old tracing to compare Confirmed by Derwood Kaplan [phone]) on 12/04/2018 7:45:30 PM   Radiology No results found.  Procedures Procedures (including critical care time)  Medications Ordered in ED Medications - No data to display   Initial Impression / Assessment and Plan / ED Course  I have reviewed the triage vital signs and the nursing notes.  Pertinent labs & imaging results that were available during my care of the patient were reviewed by me and considered in my medical decision making (see chart for details).       Patient presents with bilateral foot pain. Suspect foot pain is likely due to blisters on the bottom of foot. Blisters do not appear infected at this time. Cleaned feet and provided dressing. Will recommend patient keep area clean and dry, as well as reduce pressure as tolerated.   Suspect rash is likely due to a drug use reaction. Patient denies any difficulty breathing or swallowing.  Pt has a patent airway without stridor and is handling secretions without difficulty; no angioedema. No blisters, no pustules, no warmth, no draining sinus tracts, no superficial abscesses, no bullous impetigo, no vesicles, no desquamation, no target lesions with dusky purpura or a central bulla. Not tender to touch. No concern for SJS, TEN, TSS, tick borne illness, syphilis or other life-threatening condition. Will discharge home with doxycycline, keflex, and benadryl. Provided good rx card to make medications more affordable. Consulted peer support for outpatient assistance. Provided resources for substance abuse. Discussed return precautions with patient. Patient states he understands and agrees with plan.   Findings and plan of care discussed with supervising physician Dr. Rhunette Croft who personally evaluated and  examined this patient.   Final Clinical Impressions(s) / ED Diagnoses   Final diagnoses:  Rash  Pain in both feet    ED Discharge Orders  Ordered    doxycycline (VIBRAMYCIN) 100 MG capsule  2 times daily     12/04/18 2028    cephALEXin (KEFLEX) 500 MG capsule  4 times daily     12/04/18 2028    diphenhydrAMINE (BENADRYL) 25 MG tablet  Every 6 hours     12/04/18 2028           Leretha Dykes, [place] 12/04/18 2033    Derwood Kaplan, MD 12/04/18 2352

## 2018-12-04 NOTE — ED Notes (Signed)
Band-aids applied to bilateral feet, patient verbalized understanding of papers.

## 2018-12-04 NOTE — Discharge Instructions (Addendum)
You have been seen today for a rash and foot pain. Please read and follow all provided instructions.   1. Medications: doxycycline, keflex, benadryl, usual home medications 2. Treatment: rest, drink plenty of fluids 3. Follow Up: Please follow up with your primary doctor in 2 days for discussion of your diagnoses and further evaluation after today's visit; if you do not have a primary care doctor use the resource guide provided to find one; Please return to the ER for any new or worsening symptoms. Please obtain all of your results from medical records or have your doctors office obtain the results - share them with your doctor - you should be seen at your doctors office. Call today to arrange your follow up.   Take medications as prescribed. Please review all of the medicines and only take them if you do not have an allergy to them. Return to the emergency room for worsening condition or new concerning symptoms. Follow up with your regular doctor. If you don't have a regular doctor use one of the numbers below to establish a primary care doctor.  Please be aware that if you are taking birth control pills, taking other prescriptions, ESPECIALLY ANTIBIOTICS may make the birth control ineffective - if this is the case, either do not engage in sexual activity or use alternative methods of birth control such as condoms until you have finished the medicine and your family doctor says it is OK to restart them. If you are on a blood thinner such as COUMADIN, be aware that any other medicine that you take may cause the coumadin to either work too much, or not enough - you should have your coumadin level rechecked in next 7 days if this is the case.  ?  It is also a possibility that you have an allergic reaction to any of the medicines that you have been prescribed - Everybody reacts differently to medications and while MOST people have no trouble with most medicines, you may have a reaction such as nausea,  vomiting, rash, swelling, shortness of breath. If this is the case, please stop taking the medicine immediately and contact your physician.  ?  You should return to the ER if you develop severe or worsening symptoms.   Emergency Department Resource Guide 1) Find a Doctor and Pay Out of Pocket Although you won't have to find out who is covered by your insurance plan, it is a good idea to ask around and get recommendations. You will then need to call the office and see if the doctor you have chosen will accept you as a new patient and what types of options they offer for patients who are self-pay. Some doctors offer discounts or will set up payment plans for their patients who do not have insurance, but you will need to ask so you aren't surprised when you get to your appointment.  2) Contact Your Local Health Department Not all health departments have doctors that can see patients for sick visits, but many do, so it is worth a call to see if yours does. If you don't know where your local health department is, you can check in your phone book. The CDC also has a tool to help you locate your state's health department, and many state websites also have listings of all of their local health departments.  3) Find a Walk-in Clinic If your illness is not likely to be very severe or complicated, you may want to try a walk in clinic.  These are popping up all over the country in pharmacies, drugstores, and shopping centers. They're usually staffed by nurse practitioners or physician assistants that have been trained to treat common illnesses and complaints. They're usually fairly quick and inexpensive. However, if you have serious medical issues or chronic medical problems, these are probably not your best option.  No Primary Care Doctor: Call Health Connect at  [phone] - they can help you locate a primary care doctor that  accepts your insurance, provides certain services, etc. Physician Referral Service-  [phone]  Chronic Pain Problems: Organization         Address  Phone   Notes  [facility]  [phone] Patients need to be referred by their primary care doctor.   Medication Assistance: Organization         Address  Phone   Notes  [facility] Medication [facility] [address] [place], [place] [address] [phone] --Must be a resident of [facility] -- Must have NO insurance coverage whatsoever (no Medicaid/ Medicare, etc.) -- The pt. MUST have a primary care doctor that directs their care regularly and follows them in the community   [facility]  [phone]   [employer]  [phone]    Agencies that provide inexpensive medical care: Organization         Address  Phone   Notes  [place]  [phone]   [facility]    [phone]   [facility] [place], [place] [address] [phone]   [place] [address] [place] [phone]   [facility]    [phone]   [facility]    [phone]   [place] and [place] [address], [place] Phone:  [phone], Fax:  [phone] Hours of Operation:  9 am - 6 pm, M-F.  Also accepts Medicaid/Medicare and self-pay.  [facility] for [place] [address], [address], [place] Phone: [phone], Fax: [phone]. Hours of Operation:  8:30 am - 5:30 pm, M-F.  Also accepts Medicaid and self-pay.  [facility] [address], [place] Phone: [phone]   [place], [place], [place] [phone], Ext. [phone] Mondays & Thursdays: 7-9 AM.  First 15 patients are seen on a first come, first serve basis.    [place] Providers:  Organization         Address  Phone   Notes  [facility] [address],  [place] [phone] Also accepts self-pay patients.  [facility] [address], [place]  [phone]   [place], [address], [place] [phone]   [facility] [address], [place] [phone]   Lucianne Lei [address], [place]   [phone] Only accepts [place] [facility] [place] patients after they have their name applied to their card.   Self-Pay (no insurance) in [facility]:  Organization         Address  Phone   Notes  Sickle Cell Patients, [facility] [place] [phone]   [facility] [place] [phone]   [facility] [place]  [place], [address], [place] [phone]   [facility]/Dr. Osei-Bonsu  [address] [place] or [address], [place] [phone] Phone number for both [place] and [place] locations is the same.  Urgent Medical and [facility] [address] [place] [phone]   [facility] [address] [place] or [address] [phone]   [facility] [address], [place] [phone], phone; [phone], fax Sees patients 1st and 3rd Saturday of every month.  Must not qualify for public or private insurance (i.e. Medicaid, Medicare, [place] Health Choice, Veterans' Benefits)  Household income should be no more than 200% of the poverty level The clinic cannot treat you if you are pregnant or think you are pregnant  Sexually transmitted diseases are not treated at the clinic.

## 2018-12-17 ENCOUNTER — Ambulatory Visit (HOSPITAL_COMMUNITY)
Admission: RE | Admit: 2018-12-17 | Discharge: 2018-12-17 | Disposition: A | Discharge status: Home / Self Care | Payer: Self-pay | Attending: Psychiatry | Admitting: Psychiatry

## 2018-12-17 DIAGNOSIS — F419 Anxiety disorder, unspecified: Secondary | ICD-10-CM | POA: Insufficient documentation

## 2018-12-17 DIAGNOSIS — Z590 Homelessness: Secondary | ICD-10-CM | POA: Insufficient documentation

## 2018-12-17 DIAGNOSIS — R45851 Suicidal ideations: Secondary | ICD-10-CM | POA: Insufficient documentation

## 2018-12-17 DIAGNOSIS — Z79899 Other long term (current) drug therapy: Secondary | ICD-10-CM | POA: Insufficient documentation

## 2018-12-17 DIAGNOSIS — Z7289 Other problems related to lifestyle: Secondary | ICD-10-CM | POA: Insufficient documentation

## 2018-12-17 DIAGNOSIS — R450 Nervousness: Secondary | ICD-10-CM | POA: Insufficient documentation

## 2018-12-17 DIAGNOSIS — F332 Major depressive disorder, recurrent severe without psychotic features: Secondary | ICD-10-CM | POA: Insufficient documentation
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
## 2018-12-17 NOTE — H&P (Signed)
Behavioral Health Medical Screening Exam  James Beard. is an 33 y.o. male.  Total Time spent with patient: 15 minutes patient presented to [facility] as a walk in with suicidal ideations due to homelessness. reported he has been off his medication for the past 3 weeks and states he was unsure why they didn't refill is medications.  States he didn't have any other place to go. Reports he is followed by [facility] who prdx's his methadone, Zoloft and trazodone. NP contacted  [facility] at [phone] who reported patient medications are "pending" and will follow-up with status. Stated patient received 54 mg of Methadone this morning . However patient reported he continues to use heroin and crystal meth. - patient to follow-up with [facility]. As he stated he didn't want to detox without "subtext or methadone."  Case was staffed with MD Lucianne Muss, patient to follow-up with [facility] and [facility]. Support, encouragement and reassurances.   Psychiatric Specialty Exam: Physical Exam  Vitals reviewed. Psychiatric: He has a normal mood and affect. His behavior is normal.    Review of Systems  Psychiatric/Behavioral: Positive for depression, substance abuse and suicidal ideas. The patient is nervous/anxious.   All other systems reviewed and are negative.   Blood pressure 117/76, pulse 98, temperature 97.6 F (36.4 C), temperature source Oral, resp. rate 18, SpO2 98 %.There is no height or weight on file to calculate BMI.  General Appearance: Disheveled  Eye Contact:  Good  Speech:  Clear and Coherent  Volume:  Normal  Mood:  Anxious and Depressed  Affect:  Congruent  Thought Process:  Coherent  Orientation:  Full (Time, Place, and Person)  Thought Content:  Logical  Suicidal Thoughts:  Yes.  without intent/plan  Homicidal Thoughts:  No  Memory:  Immediate;   Fair Recent;   Fair Remote;   Fair  Judgement:  Fair  Insight:  Fair  Psychomotor Activity:  Normal  Concentration: Concentration: Fair  Recall:  [employer] of Knowledge:Fair  Language: Fair  Akathisia:  No  Handed:  Right  AIMS (if indicated):     Assets:  Communication Skills Desire for Improvement Resilience Social Support  Sleep:       Musculoskeletal: Strength & Muscle Tone: within normal limits Gait & Station: normal Patient leans: N/A  Blood pressure 117/76, pulse 98, temperature 97.6 F (36.4 C), temperature source Oral, resp. rate 18, SpO2 98 %.  Recommendations:  Based on my evaluation the patient does not appear to have an emergency medical condition.  Oneta Rack, NP 12/17/2018, 3:19 PM

## 2018-12-31 ENCOUNTER — Emergency Department (HOSPITAL_COMMUNITY)
Admission: EM | Admit: 2018-12-31 | Discharge: 2018-12-31 | Disposition: A | Discharge status: Home / Self Care | Payer: Self-pay | Attending: Emergency Medicine | Admitting: Emergency Medicine

## 2018-12-31 ENCOUNTER — Emergency Department (HOSPITAL_COMMUNITY): Payer: Self-pay

## 2018-12-31 ENCOUNTER — Encounter (HOSPITAL_COMMUNITY): Payer: Self-pay | Admitting: Radiology

## 2018-12-31 ENCOUNTER — Other Ambulatory Visit: Payer: Self-pay

## 2018-12-31 DIAGNOSIS — R6883 Chills (without fever): Secondary | ICD-10-CM | POA: Insufficient documentation

## 2018-12-31 DIAGNOSIS — F319 Bipolar disorder, unspecified: Secondary | ICD-10-CM | POA: Insufficient documentation

## 2018-12-31 DIAGNOSIS — R1031 Right lower quadrant pain: Secondary | ICD-10-CM | POA: Insufficient documentation

## 2018-12-31 DIAGNOSIS — F1110 Opioid abuse, uncomplicated: Secondary | ICD-10-CM | POA: Insufficient documentation

## 2018-12-31 DIAGNOSIS — R109 Unspecified abdominal pain: Secondary | ICD-10-CM

## 2018-12-31 DIAGNOSIS — F17210 Nicotine dependence, cigarettes, uncomplicated: Secondary | ICD-10-CM | POA: Insufficient documentation

## 2018-12-31 LAB — CBC WITH DIFFERENTIAL/PLATELET
Abs Immature Granulocytes: 0.01 10*3/uL (ref 0.00–0.07)
Basophils Absolute: 0 10*3/uL (ref 0.0–0.1)
Basophils Relative: 0 %
Eosinophils Absolute: 0 10*3/uL (ref 0.0–0.5)
Eosinophils Relative: 1 %
HCT: 37.7 % — ABNORMAL LOW (ref 39.0–52.0)
Hemoglobin: 12.2 g/dL — ABNORMAL LOW (ref 13.0–17.0)
Immature Granulocytes: 0 %
Lymphocytes Relative: 12 %
Lymphs Abs: 0.6 10*3/uL — ABNORMAL LOW (ref 0.7–4.0)
MCH: 28.8 pg (ref 26.0–34.0)
MCHC: 32.4 g/dL (ref 30.0–36.0)
MCV: 88.9 fL (ref 80.0–100.0)
Monocytes Absolute: 0.5 10*3/uL (ref 0.1–1.0)
Monocytes Relative: 10 %
Neutro Abs: 3.4 10*3/uL (ref 1.7–7.7)
Neutrophils Relative %: 77 %
Platelets: 168 10*3/uL (ref 150–400)
RBC: 4.24 MIL/uL (ref 4.22–5.81)
RDW: 12.9 % (ref 11.5–15.5)
WBC: 4.5 10*3/uL (ref 4.0–10.5)
nRBC: 0 % (ref 0.0–0.2)

## 2018-12-31 LAB — BASIC METABOLIC PANEL
Anion gap: 8 (ref 5–15)
BUN: 8 mg/dL (ref 6–20)
CO2: 28 mmol/L (ref 22–32)
Calcium: 9.2 mg/dL (ref 8.9–10.3)
Chloride: 98 mmol/L (ref 98–111)
Creatinine, Ser: 0.76 mg/dL (ref 0.61–1.24)
GFR calc Af Amer: >60 mL/min (ref >60)
GFR calc non Af Amer: >60 mL/min (ref >60)
Glucose, Bld: 96 mg/dL (ref 70–99)
Potassium: 4 mmol/L (ref 3.5–5.1)
Sodium: 134 mmol/L — ABNORMAL LOW (ref 135–145)

## 2018-12-31 LAB — URINALYSIS, ROUTINE W REFLEX MICROSCOPIC
Glucose, UA: NEGATIVE mg/dL
Hgb urine dipstick: NEGATIVE
Ketones, ur: 5 mg/dL — AB
Leukocytes,Ua: NEGATIVE
Nitrite: NEGATIVE
Protein, ur: NEGATIVE mg/dL
Specific Gravity, Urine: 1.032 — ABNORMAL HIGH (ref 1.005–1.030)
pH: 5 (ref 5.0–8.0)

## 2018-12-31 MED ORDER — KETOROLAC TROMETHAMINE 30 MG/ML IJ SOLN
30.0000 mg | Freq: Once | INTRAMUSCULAR | Status: AC
  Administered 2018-12-31: 30 mg via INTRAVENOUS
  Filled 2018-12-31: qty 1

## 2018-12-31 NOTE — ED Provider Notes (Signed)
[facility] EMERGENCY DEPARTMENT Provider Note   CSN: [id number] Arrival date & time: 12/31/18  1957    History   Chief Complaint Chief Complaint  Patient presents with  . Flank Pain    HPI James Degrand. is a 33 y.o. male with history of heroin abuse, major depressive disorder, bipolar 1 disorder, eczema presenting for evaluation of acute onset, progressively worsening right flank pain since around 11 AM today.  Reports pain is sharp, waxing and waning, worsens with "anything", does not radiate.  He took a Goody's powder with some relief.  Last injected heroin yesterday.  He notes subjective fevers and chills.  Denies shortness of breath, chest pains, nausea, vomiting.  Notes decreased urine output.  States this feels like kidney pain.  Denies bowel or bladder incontinence, saddle anesthesia, midline low back pain, or history of cancer..     The history is provided by the patient.    Past Medical History:  Diagnosis Date  . Bipolar 1 disorder (HCC)   . Eczema     Patient Active Problem List   Diagnosis Date Noted  . Opioid abuse with opioid-induced mood disorder (HCC) 10/02/2018  . Heroin abuse (HCC) 09/29/2018  . Substance induced mood disorder (HCC) 09/29/2018  . Major depressive disorder, recurrent severe without psychotic features (HCC) 09/29/2018    No past surgical history on file.      Home Medications    Prior to Admission medications   Medication Sig Start Date End Date Taking? Authorizing Provider  diphenhydrAMINE (BENADRYL) 25 MG tablet Take 1 tablet (25 mg total) by mouth every 6 (six) hours. Patient not taking: Reported on 12/31/2018 12/04/18   Carlyle Basques P, PA-C  doxycycline (VIBRAMYCIN) 100 MG capsule Take 1 capsule (100 mg total) by mouth 2 (two) times daily. Patient not taking: Reported on 12/31/2018 12/04/18   Carlyle Basques P, PA-C  mupirocin ointment (BACTROBAN) 2 % Place 1 application into the nose 2 (two) times daily.  Patient not taking: Reported on 12/04/2018 10/30/18   Petrucelli, Lelon Mast R, PA-C  naproxen (NAPROSYN) 500 MG tablet Take 1 tablet (500 mg total) by mouth 2 (two) times daily. Patient not taking: Reported on 12/04/2018 10/30/18   Petrucelli, Samantha R, PA-C  OLANZapine (ZYPREXA) 2.5 MG tablet Take 1 tablet (2.5 mg total) by mouth at bedtime. For mood control Patient not taking: Reported on 12/31/2018 10/02/18   Armandina Stammer I, NP  ondansetron (ZOFRAN ODT) 4 MG disintegrating tablet Take 1 tablet (4 mg total) by mouth every 8 (eight) hours as needed for nausea or vomiting. Patient not taking: Reported on 12/04/2018 10/22/18   Renne Crigler, PA-C  sertraline (ZOLOFT) 100 MG tablet Take 1 tablet (100 mg total) by mouth daily. For depression Patient not taking: Reported on 12/31/2018 10/03/18   Armandina Stammer I, NP  traZODone (DESYREL) 50 MG tablet Take 1 tablet (50 mg total) by mouth at bedtime as needed for sleep. Patient not taking: Reported on 12/31/2018 10/02/18   Sanjuana Kava, NP    Family History Family History  Problem Relation Age of Onset  . Diabetes Mother   . Hypertension Mother   . Depression Mother   . Diabetes Father   . Hypertension Father   . Depression Father     Social History Social History   Tobacco Use  . Smoking status: Current Every Day Smoker    Packs/day: 1.00    Types: Cigarettes  . Smokeless tobacco: Never Used  Substance Use  Topics  . Alcohol use: Not Currently  . Drug use: Not Currently    Types: Cocaine, Marijuana, Methamphetamines, IV    Comment: heroin  Patient states he goes to the Methadone clinic     Allergies   Patient has no known allergies.   Review of Systems Review of Systems  Constitutional: Positive for chills and fever.  Respiratory: Negative for shortness of breath.   Gastrointestinal: Negative for abdominal pain, nausea and vomiting.  Genitourinary: Positive for flank pain.  Musculoskeletal: Negative for back pain.  All other  systems reviewed and are negative.    Physical Exam Updated Vital Signs BP 129/90 (BP Location: Right Arm)   Pulse 64   Temp 98.9 F (37.2 C) (Oral)   Resp 18   Ht  (1.753 m)   Wt 63.5 kg   SpO2 98%   BMI 20.67 kg/m   Physical Exam Vitals signs and nursing note reviewed.  Constitutional:      General: He is not in acute distress.    Appearance: He is well-developed.  HENT:     Head: Normocephalic and atraumatic.  Eyes:     General:        Right eye: No discharge.        Left eye: No discharge.     Conjunctiva/sclera: Conjunctivae normal.  Neck:     Vascular: No JVD.     Trachea: No tracheal deviation.  Cardiovascular:     Rate and Rhythm: Normal rate and regular rhythm.  Pulmonary:     Effort: Pulmonary effort is normal.     Breath sounds: Normal breath sounds.  Abdominal:     General: Abdomen is flat. There is no distension.     Palpations: Abdomen is soft.     Tenderness: There is no abdominal tenderness. There is right CVA tenderness. There is no left CVA tenderness, guarding or rebound.  Musculoskeletal:     Comments: 5/5 strength of BLE major muscle groups.  No midline lumbar spine tenderness.  No deformity, crepitus, or step-off noted.  Skin:    General: Skin is warm and dry.     Findings: No erythema.     Comments: Several skin lesions/track marks consistent with history of IV drug use  Neurological:     Mental Status: He is alert.     Comments: Fluent speech, no facial droop, sensation intact to soft touch of bilateral lower extremities.  Psychiatric:        Behavior: Behavior normal.      ED Treatments / Results  Labs (all labs ordered are listed, but only abnormal results are displayed) Labs Reviewed  BASIC METABOLIC PANEL - Abnormal; Notable for the following components:      Result Value   Sodium 134 (*)    All other components within normal limits  CBC WITH DIFFERENTIAL/PLATELET - Abnormal; Notable for the following components:    Hemoglobin 12.2 (*)    HCT 37.7 (*)    Lymphs Abs 0.6 (*)    All other components within normal limits  URINALYSIS, ROUTINE W REFLEX MICROSCOPIC - Abnormal; Notable for the following components:   Color, Urine AMBER (*)    Specific Gravity, Urine 1.032 (*)    Bilirubin Urine SMALL (*)    Ketones, ur 5 (*)    All other components within normal limits    EKG None  Radiology Ct Renal Stone Study  Result Date: 12/31/2018 CLINICAL DATA:  Low back pain. Nephrolithiasis. EXAM: CT ABDOMEN AND PELVIS  WITHOUT CONTRAST TECHNIQUE: Multidetector CT imaging of the abdomen and pelvis was performed following the standard protocol without IV contrast. COMPARISON:  09/19/2018 FINDINGS: Lower Chest: No acute findings. Hepatobiliary: No hepatic masses identified. Gallbladder is unremarkable. Pancreas:  No mass or inflammatory changes. Spleen: Within normal limits in size and appearance. Adrenals/Urinary Tract: No masses identified. No evidence of hydronephrosis. Stomach/Bowel: No evidence of obstruction, inflammatory process or abnormal fluid collections. Vascular/Lymphatic: No pathologically enlarged lymph nodes. No abdominal aortic aneurysm. Reproductive:  No mass or other significant abnormality. Other:  None. Musculoskeletal:  No suspicious bone lesions identified. IMPRESSION: No acute findings or other significant abnormality identified. Electronically Signed   By: Myles RosenthalJohn  Stahl M.D.   On: 12/31/2018 22:40    Procedures Procedures (including critical care time)  Medications Ordered in ED Medications  ketorolac (TORADOL) 30 MG/ML injection 30 mg (30 mg Intravenous Given 12/31/18 2214)     Initial Impression / Assessment and Plan / ED Course  I have reviewed the triage vital signs and the nursing notes.  Pertinent labs & imaging results that were available during my care of the patient were reviewed by me and considered in my medical decision making (see chart for details).        Patient  presenting with right flank pain beginning earlier today.  He is afebrile, vital signs are stable.  On exam, he is sleeping comfortably in no apparent distress, easily arousable but becomes quite vocal with regards to his pain upon awakening.  He has no midline spine tenderness, no red flag signs concerning for cauda equina or spinal abscess other than his history of IV drug use.  Given his reassuring examination I have a low suspicion of such pathology.  He is ambulatory without difficulty.  Lab work reviewed by me shows no leukocytosis, mild stable anemia, no metabolic derangements.  No renal insufficiency.  UA does not suggest UTI or nephrolithiasis but does suggest mild dehydration.  Renal stone study shows no acute findings.  No evidence of acute surgical abdominal pathology, pyelonephritis, nephrolithiasis, obstruction, or perforation.  He had some improvement in his pain with Toradol.  Dr. Adela LankFloyd saw and evaluated the patient, discussed strict ED return precautions.  He likely has a musculoskeletal strain given pain is reproducible on palpation and with certain movements.  Conservative therapy discussed with patient. Patientt verbalized understanding of and agreement with plan and is safe for discharge home at this time.  Final Clinical Impressions(s) / ED Diagnoses   Final diagnoses:  Flank pain    ED Discharge Orders    None       Jeanie SewerFawze, Obert Espindola A, PA-C 01/01/19 Jeremy [id number]    Floyd, Dan, DO 01/01/19 1519

## 2018-12-31 NOTE — ED Notes (Signed)
Pt. Upon arrival, was moaning and groaning from the pain, vitals and brief description of symptoms were taken at that time. After 5 mins, pt was seen sound asleep in the hallway, while we were cleaning room from previous pt.  I walked by him multiple times and he was breathing, but was sleeping.  Upon  Moving pt. to the room, he awoke and started moaning and groaning again.  When asked why he was sleepy, he stated "it was from the pain."  EMS noted that same their initial arrival, with the RN and EMT taking over pt's care.

## 2018-12-31 NOTE — ED Triage Notes (Signed)
Pt via EMS "Kidney Pain 10 of 10"

## 2018-12-31 NOTE — Discharge Instructions (Addendum)

## 2019-01-01 ENCOUNTER — Encounter (HOSPITAL_COMMUNITY): Payer: Self-pay

## 2019-01-01 ENCOUNTER — Other Ambulatory Visit: Payer: Self-pay

## 2019-01-01 ENCOUNTER — Emergency Department (HOSPITAL_COMMUNITY)
Admission: EM | Admit: 2019-01-01 | Discharge: 2019-01-01 | Disposition: A | Discharge status: Home / Self Care | Payer: Self-pay | Attending: Emergency Medicine | Admitting: Emergency Medicine

## 2019-01-01 DIAGNOSIS — F1110 Opioid abuse, uncomplicated: Secondary | ICD-10-CM

## 2019-01-01 DIAGNOSIS — F32A Depression, unspecified: Secondary | ICD-10-CM

## 2019-01-01 DIAGNOSIS — F17210 Nicotine dependence, cigarettes, uncomplicated: Secondary | ICD-10-CM | POA: Insufficient documentation

## 2019-01-01 DIAGNOSIS — F329 Major depressive disorder, single episode, unspecified: Secondary | ICD-10-CM | POA: Insufficient documentation

## 2019-01-01 DIAGNOSIS — F1120 Opioid dependence, uncomplicated: Secondary | ICD-10-CM | POA: Insufficient documentation

## 2019-01-01 LAB — CBC WITH DIFFERENTIAL/PLATELET
Abs Immature Granulocytes: 0.02 10*3/uL (ref 0.00–0.07)
Basophils Absolute: 0 10*3/uL (ref 0.0–0.1)
Basophils Relative: 1 %
Eosinophils Absolute: 0.1 10*3/uL (ref 0.0–0.5)
Eosinophils Relative: 1 %
HCT: 40.2 % (ref 39.0–52.0)
Hemoglobin: 12.8 g/dL — ABNORMAL LOW (ref 13.0–17.0)
Immature Granulocytes: 0 %
Lymphocytes Relative: 19 %
Lymphs Abs: 1.1 10*3/uL (ref 0.7–4.0)
MCH: 28.9 pg (ref 26.0–34.0)
MCHC: 31.8 g/dL (ref 30.0–36.0)
MCV: 90.7 fL (ref 80.0–100.0)
Monocytes Absolute: 0.8 10*3/uL (ref 0.1–1.0)
Monocytes Relative: 14 %
Neutro Abs: 3.8 10*3/uL (ref 1.7–7.7)
Neutrophils Relative %: 65 %
Platelets: 198 10*3/uL (ref 150–400)
RBC: 4.43 MIL/uL (ref 4.22–5.81)
RDW: 12.9 % (ref 11.5–15.5)
WBC: 5.8 10*3/uL (ref 4.0–10.5)
nRBC: 0 % (ref 0.0–0.2)

## 2019-01-01 LAB — COMPREHENSIVE METABOLIC PANEL
ALT: 222 U/L — ABNORMAL HIGH (ref 0–44)
AST: 178 U/L — ABNORMAL HIGH (ref 15–41)
Albumin: 3.8 g/dL (ref 3.5–5.0)
Alkaline Phosphatase: 277 U/L — ABNORMAL HIGH (ref 38–126)
Anion gap: 11 (ref 5–15)
BUN: 11 mg/dL (ref 6–20)
CO2: 27 mmol/L (ref 22–32)
Calcium: 9.3 mg/dL (ref 8.9–10.3)
Chloride: 98 mmol/L (ref 98–111)
Creatinine, Ser: 0.79 mg/dL (ref 0.61–1.24)
GFR calc Af Amer: >60 mL/min (ref >60)
GFR calc non Af Amer: >60 mL/min (ref >60)
Glucose, Bld: 105 mg/dL — ABNORMAL HIGH (ref 70–99)
Potassium: 3.8 mmol/L (ref 3.5–5.1)
Sodium: 136 mmol/L (ref 135–145)
Total Bilirubin: 1.1 mg/dL (ref 0.3–1.2)
Total Protein: 7 g/dL (ref 6.5–8.1)

## 2019-01-01 LAB — RAPID URINE DRUG SCREEN, HOSP PERFORMED
Amphetamines: POSITIVE — AB
Barbiturates: NOT DETECTED
Benzodiazepines: NOT DETECTED
Cocaine: NOT DETECTED
Opiates: POSITIVE — AB
Tetrahydrocannabinol: POSITIVE — AB

## 2019-01-01 LAB — ETHANOL: Alcohol, Ethyl (B): <10 mg/dL (ref <10)

## 2019-01-01 LAB — ACETAMINOPHEN LEVEL: Acetaminophen (Tylenol), Serum: <10 ug/mL — ABNORMAL LOW (ref 10–30)

## 2019-01-01 LAB — SALICYLATE LEVEL: Salicylate Lvl: <7 mg/dL (ref 2.8–30.0)

## 2019-01-01 MED ORDER — ALUM & MAG HYDROXIDE-SIMETH 200-200-20 MG/5ML PO SUSP: 30.0000 mL | Freq: Four times a day (QID) | ORAL | Status: DC | PRN

## 2019-01-01 MED ORDER — HALOPERIDOL LACTATE 5 MG/ML IJ SOLN
2.0000 mg | Freq: Once | INTRAMUSCULAR | Status: AC
  Administered 2019-01-01: 2 mg via INTRAMUSCULAR
  Filled 2019-01-01: qty 1

## 2019-01-01 MED ORDER — IBUPROFEN 400 MG PO TABS
600.0000 mg | ORAL_TABLET | Freq: Three times a day (TID) | ORAL | Status: DC | PRN
  Administered 2019-01-01: 600 mg via ORAL
  Filled 2019-01-01: qty 1

## 2019-01-01 MED ORDER — NICOTINE 21 MG/24HR TD PT24: 21.0000 mg | MEDICATED_PATCH | Freq: Every day | TRANSDERMAL | Status: DC

## 2019-01-01 MED ORDER — HALOPERIDOL LACTATE 5 MG/ML IJ SOLN
5.0000 mg | Freq: Once | INTRAMUSCULAR | Status: AC
  Administered 2019-01-01: 5 mg via INTRAMUSCULAR
  Filled 2019-01-01: qty 1

## 2019-01-01 NOTE — Discharge Instructions (Addendum)
Go to your appointment later today to help with the depression.

## 2019-01-01 NOTE — ED Triage Notes (Signed)
Pt arrived with c/o being suicidal. Pt states that he will slit his throat with his pocket knife.

## 2019-01-01 NOTE — ED Provider Notes (Signed)
[facility] EMERGENCY DEPARTMENT Provider Note   CSN: [id number] Arrival date & time: 01/01/19  0359    History   Chief Complaint No chief complaint on file.   HPI James Beard. is a 33 y.o. male.     The history is provided by the patient.  Mental Health Problem  Presenting symptoms: depression and suicidal thoughts   Presenting symptoms: no delusions, no disorganized speech, no hallucinations and no self-mutilation   Degree of incapacity (severity):  Moderate Onset quality:  Gradual Duration:  1 day Timing:  Constant Progression:  Unchanged Chronicity:  Recurrent Context: drug abuse   Treatment compliance:  Untreated Relieved by:  Nothing Worsened by:  Nothing Ineffective treatments:  None tried Associated symptoms: no abdominal pain, no anhedonia, no anxiety, no appetite change, no chest pain, no decreased need for sleep, not distractible, no euphoric mood, no fatigue, no headaches, no hypersomnia, no hyperventilation, no insomnia, no irritability, no poor judgment, no school problems and no weight change   Risk factors: hx of mental illness     Past Medical History:  Diagnosis Date  . Bipolar 1 disorder (HCC)   . Eczema     Patient Active Problem List   Diagnosis Date Noted  . Opioid abuse with opioid-induced mood disorder (HCC) 10/02/2018  . Heroin abuse (HCC) 09/29/2018  . Substance induced mood disorder (HCC) 09/29/2018  . Major depressive disorder, recurrent severe without psychotic features (HCC) 09/29/2018    No past surgical history on file.      Home Medications    Prior to Admission medications   Medication Sig Start Date End Date Taking? Authorizing Provider  diphenhydrAMINE (BENADRYL) 25 MG tablet Take 1 tablet (25 mg total) by mouth every 6 (six) hours. Patient not taking: Reported on 12/31/2018 12/04/18   Carlyle Basques P, PA-C  doxycycline (VIBRAMYCIN) 100 MG capsule Take 1 capsule (100 mg total) by mouth 2 (two)  times daily. Patient not taking: Reported on 12/31/2018 12/04/18   Carlyle Basques P, PA-C  mupirocin ointment (BACTROBAN) 2 % Place 1 application into the nose 2 (two) times daily. Patient not taking: Reported on 12/04/2018 10/30/18   Petrucelli, Lelon Mast R, PA-C  naproxen (NAPROSYN) 500 MG tablet Take 1 tablet (500 mg total) by mouth 2 (two) times daily. Patient not taking: Reported on 12/04/2018 10/30/18   Petrucelli, Samantha R, PA-C  OLANZapine (ZYPREXA) 2.5 MG tablet Take 1 tablet (2.5 mg total) by mouth at bedtime. For mood control Patient not taking: Reported on 12/31/2018 10/02/18   Armandina Stammer I, NP  ondansetron (ZOFRAN ODT) 4 MG disintegrating tablet Take 1 tablet (4 mg total) by mouth every 8 (eight) hours as needed for nausea or vomiting. Patient not taking: Reported on 12/04/2018 10/22/18   Renne Crigler, PA-C  sertraline (ZOLOFT) 100 MG tablet Take 1 tablet (100 mg total) by mouth daily. For depression Patient not taking: Reported on 12/31/2018 10/03/18   Armandina Stammer I, NP  traZODone (DESYREL) 50 MG tablet Take 1 tablet (50 mg total) by mouth at bedtime as needed for sleep. Patient not taking: Reported on 12/31/2018 10/02/18   Sanjuana Kava, NP    Family History Family History  Problem Relation Age of Onset  . Diabetes Mother   . Hypertension Mother   . Depression Mother   . Diabetes Father   . Hypertension Father   . Depression Father     Social History Social History   Tobacco Use  . Smoking status: Current  Every Day Smoker    Packs/day: 1.00    Types: Cigarettes  . Smokeless tobacco: Never Used  Substance Use Topics  . Alcohol use: Not Currently  . Drug use: Not Currently    Types: Cocaine, Marijuana, Methamphetamines, IV    Comment: heroin  Patient states he goes to the Methadone clinic     Allergies   Patient has no known allergies.   Review of Systems Review of Systems  Constitutional: Negative for appetite change, fatigue and irritability.  Cardiovascular:  Negative for chest pain.  Gastrointestinal: Negative for abdominal pain.  Genitourinary: Negative for flank pain.  Neurological: Negative for headaches.  Psychiatric/Behavioral: Positive for dysphoric mood and suicidal ideas. Negative for hallucinations and self-injury. The patient is not nervous/anxious and does not have insomnia.   All other systems reviewed and are negative.    Physical Exam Updated Vital Signs BP (!) 143/82 (BP Location: Right Arm)   Pulse 70   Temp 98.5 F (36.9 C) (Oral)   SpO2 100%   Physical Exam Vitals signs and nursing note reviewed.  Constitutional:      General: He is not in acute distress.    Appearance: He is normal weight.  HENT:     Head: Normocephalic and atraumatic.     Nose: Nose normal.  Eyes:     Conjunctiva/sclera: Conjunctivae normal.     Pupils: Pupils are equal, round, and reactive to light.  Neck:     Musculoskeletal: Normal range of motion and neck supple.  Cardiovascular:     Rate and Rhythm: Normal rate and regular rhythm.     Pulses: Normal pulses.     Heart sounds: Normal heart sounds.  Pulmonary:     Effort: Pulmonary effort is normal.     Breath sounds: Normal breath sounds.  Abdominal:     General: Abdomen is flat. Bowel sounds are normal.     Tenderness: There is no abdominal tenderness. There is no guarding.  Musculoskeletal: Normal range of motion.  Skin:    General: Skin is warm and dry.     Capillary Refill: Capillary refill takes less than 2 seconds.  Neurological:     General: No focal deficit present.     Mental Status: He is alert and oriented to person, place, and time.  Psychiatric:        Mood and Affect: Mood is depressed.      ED Treatments / Results  Labs (all labs ordered are listed, but only abnormal results are displayed) Results for orders placed or performed during the hospital encounter of 01/01/19  Comprehensive metabolic panel  Result Value Ref Range   Sodium 136 135 - 145 mmol/L    Potassium 3.8 3.5 - 5.1 mmol/L   Chloride 98 98 - 111 mmol/L   CO2 27 22 - 32 mmol/L   Glucose, Bld 105 (H) 70 - 99 mg/dL   BUN 11 6 - 20 mg/dL   Creatinine, Ser 1.61 0.61 - 1.24 mg/dL   Calcium 9.3 8.9 - 09.6 mg/dL   Total Protein 7.0 6.5 - 8.1 g/dL   Albumin 3.8 3.5 - 5.0 g/dL   AST 045 (H) 15 - 41 U/L   ALT 222 (H) 0 - 44 U/L   Alkaline Phosphatase 277 (H) 38 - 126 U/L   Total Bilirubin 1.1 0.3 - 1.2 mg/dL   GFR calc non Af Amer >60 >60 mL/min   GFR calc Af Amer >60 >60 mL/min   Anion gap 11 5 -  15  Ethanol  Result Value Ref Range   Alcohol, Ethyl (B) <10 <10 mg/dL  Urine rapid drug screen (hosp performed)  Result Value Ref Range   Opiates POSITIVE (A) NONE DETECTED   Cocaine NONE DETECTED NONE DETECTED   Benzodiazepines NONE DETECTED NONE DETECTED   Amphetamines POSITIVE (A) NONE DETECTED   Tetrahydrocannabinol POSITIVE (A) NONE DETECTED   Barbiturates NONE DETECTED NONE DETECTED  CBC with Diff  Result Value Ref Range   WBC 5.8 4.0 - 10.5 K/uL   RBC 4.43 4.22 - 5.81 MIL/uL   Hemoglobin 12.8 (L) 13.0 - 17.0 g/dL   HCT 16.1 09.6 - 04.5 %   MCV 90.7 80.0 - 100.0 fL   MCH 28.9 26.0 - 34.0 pg   MCHC 31.8 30.0 - 36.0 g/dL   RDW 40.9 81.1 - 91.4 %   Platelets 198 150 - 400 K/uL   nRBC 0.0 0.0 - 0.2 %   Neutrophils Relative % 65 %   Neutro Abs 3.8 1.7 - 7.7 K/uL   Lymphocytes Relative 19 %   Lymphs Abs 1.1 0.7 - 4.0 K/uL   Monocytes Relative 14 %   Monocytes Absolute 0.8 0.1 - 1.0 K/uL   Eosinophils Relative 1 %   Eosinophils Absolute 0.1 0.0 - 0.5 K/uL   Basophils Relative 1 %   Basophils Absolute 0.0 0.0 - 0.1 K/uL   Immature Granulocytes 0 %   Abs Immature Granulocytes 0.02 0.00 - 0.07 K/uL  Acetaminophen level  Result Value Ref Range   Acetaminophen (Tylenol), Serum <10 (L) 10 - 30 ug/mL  Salicylate level  Result Value Ref Range   Salicylate Lvl <7.0 2.8 - 30.0 mg/dL   Ct Renal Stone Study  Result Date: 12/31/2018 CLINICAL DATA:  Low back pain.  Nephrolithiasis. EXAM: CT ABDOMEN AND PELVIS WITHOUT CONTRAST TECHNIQUE: Multidetector CT imaging of the abdomen and pelvis was performed following the standard protocol without IV contrast. COMPARISON:  09/19/2018 FINDINGS: Lower Chest: No acute findings. Hepatobiliary: No hepatic masses identified. Gallbladder is unremarkable. Pancreas:  No mass or inflammatory changes. Spleen: Within normal limits in size and appearance. Adrenals/Urinary Tract: No masses identified. No evidence of hydronephrosis. Stomach/Bowel: No evidence of obstruction, inflammatory process or abnormal fluid collections. Vascular/Lymphatic: No pathologically enlarged lymph nodes. No abdominal aortic aneurysm. Reproductive:  No mass or other significant abnormality. Other:  None. Musculoskeletal:  No suspicious bone lesions identified. IMPRESSION: No acute findings or other significant abnormality identified. Electronically Signed   By: Myles Rosenthal M.D.   On: 12/31/2018 22:40    EKG  EKG Interpretation  Date/Time:  Tuesday Cason Luffman 28 2020 04:27:28 EDT Ventricular Rate:  61 PR Interval:    QRS Duration: 103 QT Interval:  404 QTC Calculation: 407 R Axis:   79 Text Interpretation:  Sinus rhythm Confirmed by Rayn Enderson ([id number]) on 01/01/2019 6:27:39 AM       Radiology Ct Renal Stone Study  Result Date: 12/31/2018 CLINICAL DATA:  Low back pain. Nephrolithiasis. EXAM: CT ABDOMEN AND PELVIS WITHOUT CONTRAST TECHNIQUE: Multidetector CT imaging of the abdomen and pelvis was performed following the standard protocol without IV contrast. COMPARISON:  09/19/2018 FINDINGS: Lower Chest: No acute findings. Hepatobiliary: No hepatic masses identified. Gallbladder is unremarkable. Pancreas:  No mass or inflammatory changes. Spleen: Within normal limits in size and appearance. Adrenals/Urinary Tract: No masses identified. No evidence of hydronephrosis. Stomach/Bowel: No evidence of obstruction, inflammatory process or abnormal fluid  collections. Vascular/Lymphatic: No pathologically enlarged lymph nodes. No abdominal aortic aneurysm. Reproductive:  No mass or other significant abnormality. Other:  None. Musculoskeletal:  No suspicious bone lesions identified. IMPRESSION: No acute findings or other significant abnormality identified. Electronically Signed   By: Myles RosenthalJohn  Stahl M.D.   On: 12/31/2018 22:40    Procedures Procedures (including critical care time)  Medications Ordered in ED Medications  ibuprofen (ADVIL) tablet 600 mg (has no administration in time range)  alum & mag hydroxide-simeth (MAALOX/MYLANTA) 200-200-20 MG/5ML suspension 30 mL (has no administration in time range)  nicotine (NICODERM CQ - dosed in mg/24 hours) patch 21 mg (has no administration in time range)  haloperidol lactate (HALDOL) injection 2 mg (2 mg Intramuscular Given 01/01/19 0445)  haloperidol lactate (HALDOL) injection 5 mg (5 mg Intramuscular Given 01/01/19 0513)    LFTs are consistent with hep c.   Final Clinical Impressions(s) / ED Diagnoses   Medically cleared by me.  Holding orders placed.  Disposition per psychiatry.     Tekela Garguilo, MD 01/01/19 [phone]
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
## 2019-01-01 NOTE — ED Provider Notes (Signed)
  Physical Exam  BP 131/76   Pulse 60   Temp 98.9 F (37.2 C) (Oral)   Resp 19   SpO2 99%   Physical Exam  ED Course/Procedures     Procedures  MDM  Discussed with behavioral health.  They have seen patient and cleared him from a psychiatric standpoint.  States he has follow-up in a couple hours which could help him get further treatment.  Reportedly not a risk to himself at this time.  Will be given resources for detox.  Discharge home.       Benjiman Core, MD 01/01/19 [phone]

## 2019-01-01 NOTE — ED Notes (Signed)
BFAST ORDERED  

## 2019-01-01 NOTE — ED Notes (Signed)
Pt admitted to doing Heroin less than 24 hrs ago.

## 2019-01-01 NOTE — Consult Note (Signed)
  Tele psych Assessment   James Brome., 33 y.o., male patient seen via tele psych by TTS and this provider; chart reviewed and consulted with Dr. Lucianne Muss on 01/01/19.  Patient has had 19 ED visits and 1 psychiatric hospitalization in the last 6 months with similar complaints.  On evaluation James Bulls. reports that this main concern is being homeless and wanting to get help for his drug problem "I want to stop doing drugs.  I want to do better."  Patient states that he was staying in a motel room provided by [facility] but was kicked out related to having to much activity in the room.  Patient states that he has never made any calls to rehab facilities in past; but that he is willing to try.  Patient states that he is aware that he can still use the phones at [facility] to call rehab facilities.  Explained to the patient that he would have to make the calls because an over the phone interview would be done. Understanding voiced.  Patient states that he has an appointment with [facility] today at 11:30 am and that he will be fine.  At this time patient is denying suicidal/homicidal ideation, psychosis, and paranoia.  Discussed with patient talking to [facility] staff to also assist with rehab services.    During evaluation James Rege. is sitting up in bed; he is alert/oriented x 4; calm/cooperative; and mood congruent with affect.  Patient is speaking in a clear tone at moderate volume, and normal pace; with good eye contact.  His thought process is coherent and relevant; There is no indication that he is currently responding to internal/external stimuli or experiencing delusional thought content.  Patient denies suicidal/self-harm/homicidal ideation, psychosis, and paranoia.  Patient has remained calm throughout assessment and has answered questions appropriately.     For detailed note see TTS tele assessment note  Recommendations:  Keep scheduled appointment with [facility]; Give community resources and  listing of rehab facilities and substance abuse assistance programs  Disposition:  Patient is psychiatrically cleared  No evidence of imminent risk to self or others at present.   Patient does not meet criteria for psychiatric inpatient admission. Supportive therapy provided about ongoing stressors. Discussed crisis plan, support from social network, calling 911, coming to the Emergency Department, and calling Suicide Hotline.  Spoke with Dr. Rubin Payor informed of above recommendation and disposition  Assunta Found, NP

## 2019-01-07 ENCOUNTER — Inpatient Hospital Stay (HOSPITAL_COMMUNITY)
Admission: EM | Admit: 2019-01-07 | Discharge: 2019-01-31 | DRG: 485 | Discharge status: Against Medical Advice | Payer: Self-pay | Attending: Student in an Organized Health Care Education/Training Program | Admitting: Student in an Organized Health Care Education/Training Program

## 2019-01-07 ENCOUNTER — Emergency Department (HOSPITAL_COMMUNITY): Payer: Self-pay

## 2019-01-07 ENCOUNTER — Other Ambulatory Visit: Payer: Self-pay

## 2019-01-07 ENCOUNTER — Encounter (HOSPITAL_COMMUNITY): Payer: Self-pay | Admitting: *Deleted

## 2019-01-07 DIAGNOSIS — M009 Pyogenic arthritis, unspecified: Secondary | ICD-10-CM | POA: Diagnosis present

## 2019-01-07 DIAGNOSIS — Z23 Encounter for immunization: Secondary | ICD-10-CM

## 2019-01-07 DIAGNOSIS — B1920 Unspecified viral hepatitis C without hepatic coma: Secondary | ICD-10-CM | POA: Diagnosis present

## 2019-01-07 DIAGNOSIS — W08XXXA Fall from other furniture, initial encounter: Secondary | ICD-10-CM | POA: Diagnosis present

## 2019-01-07 DIAGNOSIS — F1490 Cocaine use, unspecified, uncomplicated: Secondary | ICD-10-CM

## 2019-01-07 DIAGNOSIS — Z20828 Contact with and (suspected) exposure to other viral communicable diseases: Secondary | ICD-10-CM | POA: Diagnosis present

## 2019-01-07 DIAGNOSIS — G4700 Insomnia, unspecified: Secondary | ICD-10-CM | POA: Diagnosis present

## 2019-01-07 DIAGNOSIS — Z590 Homelessness: Secondary | ICD-10-CM

## 2019-01-07 DIAGNOSIS — G8929 Other chronic pain: Secondary | ICD-10-CM | POA: Diagnosis present

## 2019-01-07 DIAGNOSIS — M545 Low back pain: Secondary | ICD-10-CM | POA: Diagnosis present

## 2019-01-07 DIAGNOSIS — M4620 Osteomyelitis of vertebra, site unspecified: Secondary | ICD-10-CM | POA: Diagnosis present

## 2019-01-07 DIAGNOSIS — Z9114 Patient's other noncompliance with medication regimen: Secondary | ICD-10-CM

## 2019-01-07 DIAGNOSIS — Z5329 Procedure and treatment not carried out because of patient's decision for other reasons: Secondary | ICD-10-CM | POA: Diagnosis not present

## 2019-01-07 DIAGNOSIS — R011 Cardiac murmur, unspecified: Secondary | ICD-10-CM | POA: Diagnosis present

## 2019-01-07 DIAGNOSIS — Z8249 Family history of ischemic heart disease and other diseases of the circulatory system: Secondary | ICD-10-CM

## 2019-01-07 DIAGNOSIS — M25461 Effusion, right knee: Secondary | ICD-10-CM

## 2019-01-07 DIAGNOSIS — E873 Alkalosis: Secondary | ICD-10-CM | POA: Diagnosis not present

## 2019-01-07 DIAGNOSIS — R7881 Bacteremia: Secondary | ICD-10-CM | POA: Diagnosis present

## 2019-01-07 DIAGNOSIS — G061 Intraspinal abscess and granuloma: Secondary | ICD-10-CM | POA: Diagnosis present

## 2019-01-07 DIAGNOSIS — F1590 Other stimulant use, unspecified, uncomplicated: Secondary | ICD-10-CM

## 2019-01-07 DIAGNOSIS — K5900 Constipation, unspecified: Secondary | ICD-10-CM | POA: Diagnosis present

## 2019-01-07 DIAGNOSIS — H538 Other visual disturbances: Secondary | ICD-10-CM | POA: Diagnosis present

## 2019-01-07 DIAGNOSIS — F17210 Nicotine dependence, cigarettes, uncomplicated: Secondary | ICD-10-CM | POA: Diagnosis present

## 2019-01-07 DIAGNOSIS — L02415 Cutaneous abscess of right lower limb: Secondary | ICD-10-CM | POA: Diagnosis present

## 2019-01-07 DIAGNOSIS — M25469 Effusion, unspecified knee: Secondary | ICD-10-CM

## 2019-01-07 DIAGNOSIS — S80261A Insect bite (nonvenomous), right knee, initial encounter: Secondary | ICD-10-CM | POA: Diagnosis present

## 2019-01-07 DIAGNOSIS — M25572 Pain in left ankle and joints of left foot: Secondary | ICD-10-CM

## 2019-01-07 DIAGNOSIS — Z818 Family history of other mental and behavioral disorders: Secondary | ICD-10-CM

## 2019-01-07 DIAGNOSIS — E43 Unspecified severe protein-calorie malnutrition: Secondary | ICD-10-CM | POA: Diagnosis present

## 2019-01-07 DIAGNOSIS — F332 Major depressive disorder, recurrent severe without psychotic features: Secondary | ICD-10-CM

## 2019-01-07 DIAGNOSIS — D649 Anemia, unspecified: Secondary | ICD-10-CM | POA: Diagnosis present

## 2019-01-07 DIAGNOSIS — Z6826 Body mass index (BMI) 26.0-26.9, adult: Secondary | ICD-10-CM

## 2019-01-07 DIAGNOSIS — Z833 Family history of diabetes mellitus: Secondary | ICD-10-CM

## 2019-01-07 DIAGNOSIS — M25579 Pain in unspecified ankle and joints of unspecified foot: Secondary | ICD-10-CM

## 2019-01-07 DIAGNOSIS — F1190 Opioid use, unspecified, uncomplicated: Secondary | ICD-10-CM | POA: Diagnosis present

## 2019-01-07 DIAGNOSIS — M064 Inflammatory polyarthropathy: Secondary | ICD-10-CM | POA: Diagnosis present

## 2019-01-07 DIAGNOSIS — M65972 Unspecified synovitis and tenosynovitis, left ankle and foot: Secondary | ICD-10-CM | POA: Diagnosis present

## 2019-01-07 DIAGNOSIS — M659 Synovitis and tenosynovitis, unspecified: Secondary | ICD-10-CM | POA: Diagnosis present

## 2019-01-07 DIAGNOSIS — M25569 Pain in unspecified knee: Secondary | ICD-10-CM

## 2019-01-07 DIAGNOSIS — R64 Cachexia: Secondary | ICD-10-CM | POA: Diagnosis present

## 2019-01-07 DIAGNOSIS — F1910 Other psychoactive substance abuse, uncomplicated: Secondary | ICD-10-CM

## 2019-01-07 DIAGNOSIS — M65172 Other infective (teno)synovitis, left ankle and foot: Secondary | ICD-10-CM | POA: Diagnosis present

## 2019-01-07 DIAGNOSIS — M461 Sacroiliitis, not elsewhere classified: Secondary | ICD-10-CM | POA: Diagnosis present

## 2019-01-07 DIAGNOSIS — F1110 Opioid abuse, uncomplicated: Secondary | ICD-10-CM | POA: Diagnosis present

## 2019-01-07 DIAGNOSIS — B9561 Methicillin susceptible Staphylococcus aureus infection as the cause of diseases classified elsewhere: Secondary | ICD-10-CM | POA: Diagnosis present

## 2019-01-07 DIAGNOSIS — M00061 Staphylococcal arthritis, right knee: Principal | ICD-10-CM | POA: Diagnosis present

## 2019-01-07 DIAGNOSIS — F1199 Opioid use, unspecified with unspecified opioid-induced disorder: Secondary | ICD-10-CM | POA: Diagnosis present

## 2019-01-07 DIAGNOSIS — F319 Bipolar disorder, unspecified: Secondary | ICD-10-CM | POA: Diagnosis present

## 2019-01-07 DIAGNOSIS — Z7982 Long term (current) use of aspirin: Secondary | ICD-10-CM

## 2019-01-07 HISTORY — DX: Homelessness: Z59.0

## 2019-01-07 HISTORY — DX: Homelessness unspecified: Z59.00

## 2019-01-07 HISTORY — DX: Other psychoactive substance abuse, uncomplicated: F19.10

## 2019-01-07 LAB — COMPREHENSIVE METABOLIC PANEL
ALT: 35 U/L (ref 0–44)
AST: 22 U/L (ref 15–41)
Albumin: 2.6 g/dL — ABNORMAL LOW (ref 3.5–5.0)
Alkaline Phosphatase: 196 U/L — ABNORMAL HIGH (ref 38–126)
Anion gap: 9 (ref 5–15)
BUN: 9 mg/dL (ref 6–20)
CO2: 29 mmol/L (ref 22–32)
Calcium: 8.8 mg/dL — ABNORMAL LOW (ref 8.9–10.3)
Chloride: 99 mmol/L (ref 98–111)
Creatinine, Ser: 0.56 mg/dL — ABNORMAL LOW (ref 0.61–1.24)
GFR calc Af Amer: >60 mL/min (ref >60)
GFR calc non Af Amer: >60 mL/min (ref >60)
Glucose, Bld: 100 mg/dL — ABNORMAL HIGH (ref 70–99)
Potassium: 3.5 mmol/L (ref 3.5–5.1)
Sodium: 137 mmol/L (ref 135–145)
Total Bilirubin: 0.6 mg/dL (ref 0.3–1.2)
Total Protein: 6.2 g/dL — ABNORMAL LOW (ref 6.5–8.1)

## 2019-01-07 LAB — CBC WITH DIFFERENTIAL/PLATELET
Abs Immature Granulocytes: 0.02 10*3/uL (ref 0.00–0.07)
Basophils Absolute: 0 10*3/uL (ref 0.0–0.1)
Basophils Relative: 0 %
Eosinophils Absolute: 0.2 10*3/uL (ref 0.0–0.5)
Eosinophils Relative: 2 %
HCT: 32.5 % — ABNORMAL LOW (ref 39.0–52.0)
Hemoglobin: 10.4 g/dL — ABNORMAL LOW (ref 13.0–17.0)
Immature Granulocytes: 0 %
Lymphocytes Relative: 18 %
Lymphs Abs: 1.4 10*3/uL (ref 0.7–4.0)
MCH: 28.7 pg (ref 26.0–34.0)
MCHC: 32 g/dL (ref 30.0–36.0)
MCV: 89.8 fL (ref 80.0–100.0)
Monocytes Absolute: 1 10*3/uL (ref 0.1–1.0)
Monocytes Relative: 13 %
Neutro Abs: 5.2 10*3/uL (ref 1.7–7.7)
Neutrophils Relative %: 67 %
Platelets: 337 10*3/uL (ref 150–400)
RBC: 3.62 MIL/uL — ABNORMAL LOW (ref 4.22–5.81)
RDW: 13 % (ref 11.5–15.5)
WBC: 7.8 10*3/uL (ref 4.0–10.5)
nRBC: 0 % (ref 0.0–0.2)

## 2019-01-07 LAB — SYNOVIAL CELL COUNT + DIFF, W/ CRYSTALS
Crystals, Fluid: NONE SEEN
Eosinophils-Synovial: 0 % (ref 0–1)
Lymphocytes-Synovial Fld: 0 % (ref 0–20)
Monocyte-Macrophage-Synovial Fluid: 5 % — ABNORMAL LOW (ref 50–90)
Neutrophil, Synovial: 95 % — ABNORMAL HIGH (ref 0–25)
WBC, Synovial: 48000 /mm3 — ABNORMAL HIGH (ref 0–200)

## 2019-01-07 LAB — SEDIMENTATION RATE: Sed Rate: 83 mm/hr — ABNORMAL HIGH (ref 0–16)

## 2019-01-07 LAB — SARS CORONAVIRUS 2 BY RT PCR (HOSPITAL ORDER, PERFORMED IN ~~LOC~~ HOSPITAL LAB): SARS Coronavirus 2: NEGATIVE

## 2019-01-07 LAB — C-REACTIVE PROTEIN: CRP: 18.1 mg/dL — ABNORMAL HIGH (ref <1.0)

## 2019-01-07 MED ORDER — ACETAMINOPHEN 325 MG PO TABS
650.0000 mg | ORAL_TABLET | Freq: Four times a day (QID) | ORAL | Status: DC | PRN
  Administered 2019-01-17: 14:00:00 650 mg via ORAL
  Filled 2019-01-07: qty 2

## 2019-01-07 MED ORDER — OLANZAPINE 2.5 MG PO TABS
2.5000 mg | ORAL_TABLET | Freq: Every day | ORAL | Status: DC
  Administered 2019-01-07 – 2019-01-30 (×24): 2.5 mg via ORAL
  Filled 2019-01-07 (×26): qty 1

## 2019-01-07 MED ORDER — METHADONE HCL 10 MG/ML PO CONC: 69.0000 mg | Freq: Every day | ORAL | Status: DC

## 2019-01-07 MED ORDER — SERTRALINE HCL 100 MG PO TABS
100.0000 mg | ORAL_TABLET | Freq: Every day | ORAL | Status: DC
  Administered 2019-01-07: 100 mg via ORAL
  Filled 2019-01-07: qty 1

## 2019-01-07 MED ORDER — ENSURE ENLIVE PO LIQD: 237.0000 mL | Freq: Two times a day (BID) | ORAL | Status: DC

## 2019-01-07 MED ORDER — NICOTINE 21 MG/24HR TD PT24
21.0000 mg | MEDICATED_PATCH | Freq: Every day | TRANSDERMAL | Status: DC
  Administered 2019-01-07 – 2019-01-31 (×25): 21 mg via TRANSDERMAL
  Filled 2019-01-07 (×25): qty 1

## 2019-01-07 MED ORDER — OXYCODONE-ACETAMINOPHEN 7.5-325 MG PO TABS
1.0000 | ORAL_TABLET | Freq: Four times a day (QID) | ORAL | Status: DC | PRN
  Administered 2019-01-09 – 2019-01-16 (×13): 1 via ORAL
  Filled 2019-01-07 (×14): qty 1

## 2019-01-07 MED ORDER — ACETAMINOPHEN 650 MG RE SUPP: 650.0000 mg | Freq: Four times a day (QID) | RECTAL | Status: DC | PRN

## 2019-01-07 MED ORDER — PROMETHAZINE HCL 25 MG PO TABS: 12.5000 mg | ORAL_TABLET | Freq: Four times a day (QID) | ORAL | Status: DC | PRN

## 2019-01-07 MED ORDER — SODIUM CHLORIDE 0.9 % IV SOLN
2.0000 g | INTRAVENOUS | Status: DC
  Filled 2019-01-07: qty 20

## 2019-01-07 MED ORDER — KETOROLAC TROMETHAMINE 15 MG/ML IJ SOLN
15.0000 mg | Freq: Four times a day (QID) | INTRAMUSCULAR | Status: DC | PRN
  Filled 2019-01-07: qty 1

## 2019-01-07 MED ORDER — KETOROLAC TROMETHAMINE 15 MG/ML IJ SOLN
15.0000 mg | Freq: Four times a day (QID) | INTRAMUSCULAR | Status: DC
  Administered 2019-01-07 – 2019-01-10 (×9): 15 mg via INTRAVENOUS
  Filled 2019-01-07 (×8): qty 1

## 2019-01-07 MED ORDER — VANCOMYCIN HCL 10 G IV SOLR
1250.0000 mg | Freq: Once | INTRAVENOUS | Status: AC
  Administered 2019-01-07: 1250 mg via INTRAVENOUS
  Filled 2019-01-07: qty 1250

## 2019-01-07 MED ORDER — LIDOCAINE-EPINEPHRINE (PF) 2 %-1:200000 IJ SOLN
10.0000 mL | Freq: Once | INTRAMUSCULAR | Status: AC
  Administered 2019-01-07: 10 mL
  Filled 2019-01-07: qty 20

## 2019-01-07 MED ORDER — SENNOSIDES-DOCUSATE SODIUM 8.6-50 MG PO TABS: 1.0000 | ORAL_TABLET | Freq: Every evening | ORAL | Status: DC | PRN

## 2019-01-07 MED ORDER — ENOXAPARIN SODIUM 40 MG/0.4ML ~~LOC~~ SOLN
40.0000 mg | SUBCUTANEOUS | Status: DC
  Administered 2019-01-07 – 2019-01-23 (×16): 40 mg via SUBCUTANEOUS
  Filled 2019-01-07 (×23): qty 0.4
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
## 2019-01-07 NOTE — ED Provider Notes (Cosign Needed)
Joint aspiration performed at bedside. See Dr. Denny Levy note for full HPI and workup.  ED Course/Procedures     .Joint Aspiration/Arthrocentesis Date/Time: 01/07/2019 4:37 PM Performed by: Robinson, [place] N, PA-C Authorized by: Robinson, [place] N, PA-C   Consent:    Consent obtained:  Verbal   Consent given by:  Patient   Risks discussed:  Bleeding, infection and pain Location:    Location:  Knee   Knee:  R knee Anesthesia (see MAR for exact dosages):    Anesthesia method:  Local infiltration   Local anesthetic:  Lidocaine 2% WITH epi Procedure details:    Preparation: Patient was prepped and draped in usual sterile fashion     Needle gauge:  18 G   Ultrasound guidance: no     Approach:  Lateral   Aspirate amount:  65cc   Aspirate characteristics:  Blood-tinged, cloudy and yellow   Steroid injected: no     Specimen collected: yes   Post-procedure details:    Dressing:  Adhesive bandage   Patient tolerance of procedure:  Tolerated well, no immediate complications          Robinson, [place] N, [place] 01/07/19 1639

## 2019-01-07 NOTE — ED Notes (Signed)
ED TO INPATIENT HANDOFF REPORT  ED Nurse Name and Phone #:  Alaura Schippers R. [phone]  S Name/Age/Gender James Bromeameron R Niese Jr. 33 y.o. male Room/Bed: 039C/039C  Code Status   Code Status: Full Code  Home/SNF/Other Home Patient oriented to: self, place, time and situation Is this baseline? Yes   Triage Complete: Triage complete  ChieLorenza Evangelistf Complaint knee pain  Triage Note Pt with chronic knee pain states that he fell asleep in a odd position 5-6 days ago and has had increased knee pain.  Pt right knee is visibly swollen (larger than the other knee).  Pt also has swelling and pain in left ankle.  Pt states that he is homeless and uses IV drugs, meth and crack and whatever he can get. No fever or chills with this.    Allergies No Known Allergies  Level of Care/Admitting Diagnosis ED Disposition    ED Disposition Condition Comment   Admit  [facility]ospital [facility] [[id number]]  Level of Care: Med-Surg [16]  Covid Evaluation: N/A  Diagnosis: Arthritis, septic, knee [facility](HCC) [[id number]][[id number]]  Admitting Physician: Anne ShutterAINES, ALEXANDER N [[id number]]  Attending Physician: Anne ShutterRAINES, ALEXANDER N [[id number]]  Estimated length of stay: past midnight tomorrow  Certification:: I certify this patient will need inpatient services for at least 2 midnights  PT Class (Do Not Modify): Inpatient [101]  PT Acc Code (Do Not Modify): Private [1]       B Medical/Surgery History Past Medical History:  Diagnosis Date  . Bipolar 1 disorder (HCC)   . Eczema   . Homelessness   . IV drug abuse (HCC)    History reviewed. No pertinent surgical history.   A IV Location/Drains/Wounds Patient Lines/Drains/Airways Status   Active Line/Drains/Airways    Name:   Placement date:   Placement time:   Site:   Days:   Peripheral IV 01/07/19 Right Forearm   01/07/19    2049    Forearm   less than 1          Intake/Output Last 24 hours No intake or output data in the 24 hours ending 01/07/19  2147  Labs/Imaging Results for orders placed or performed during the hospital encounter of 01/07/19 (from the past 48 hour(s))  Body fluid culture     Status: None (Preliminary result)   Collection Time: 01/07/19  2:28 PM  Result Value Ref Range   Specimen Description KNEE    Special Requests RIGHT    Gram Stain      ABUNDANT WBC PRESENT, PREDOMINANTLY PMN NO ORGANISMS SEEN Performed at [facility], [address] [place], [place] [address]    Culture PENDING    Report Status PENDING   Synovial cell count + diff, w/ crystals     Status: Abnormal   Collection Time: 01/07/19  2:28 PM  Result Value Ref Range   Color, Synovial AMBER (A) YELLOW   Appearance-Synovial TURBID (A) CLEAR   Crystals, Fluid NO CRYSTALS SEEN    WBC, Synovial 48,000 (H) 0 - 200 /cu mm   Neutrophil, Synovial 95 (H) 0 - 25 %   Lymphocytes-Synovial Fld 0 0 - 20 %   Monocyte-Macrophage-Synovial Fluid 5 (L) 50 - 90 %   Eosinophils-Synovial 0 0 - 1 %    Comment: Performed at [facility], [address] [place], [place] [address]  CBC with Differential/Platelet     Status: Abnormal   Collection Time: 01/07/19  8:17 PM  Result Value Ref Range   WBC  7.8 4.0 - 10.5 K/uL   RBC 3.62 (L) 4.22 - 5.81 MIL/uL   Hemoglobin 10.4 (L) 13.0 - 17.0 g/dL   HCT 03.2 (L) 12.2 - 48.2 %   MCV 89.8 80.0 - 100.0 fL   MCH 28.7 26.0 - 34.0 pg   MCHC 32.0 30.0 - 36.0 g/dL   RDW 50.0 37.0 - 48.8 %   Platelets 337 150 - 400 K/uL   nRBC 0.0 0.0 - 0.2 %   Neutrophils Relative % 67 %   Neutro Abs 5.2 1.7 - 7.7 K/uL   Lymphocytes Relative 18 %   Lymphs Abs 1.4 0.7 - 4.0 K/uL   Monocytes Relative 13 %   Monocytes Absolute 1.0 0.1 - 1.0 K/uL   Eosinophils Relative 2 %   Eosinophils Absolute 0.2 0.0 - 0.5 K/uL   Basophils Relative 0 %   Basophils Absolute 0.0 0.0 - 0.1 K/uL   Immature Granulocytes 0 %   Abs Immature Granulocytes 0.02 0.00 - 0.07 K/uL    Comment: Performed at [facility], [address],  [place], [place] [address]  Comprehensive metabolic panel     Status: Abnormal   Collection Time: 01/07/19  8:17 PM  Result Value Ref Range   Sodium 137 135 - 145 mmol/L   Potassium 3.5 3.5 - 5.1 mmol/L   Chloride 99 98 - 111 mmol/L   CO2 29 22 - 32 mmol/L   Glucose, Bld 100 (H) 70 - 99 mg/dL   BUN 9 6 - 20 mg/dL   Creatinine, Ser 4.50 (L) 0.61 - 1.24 mg/dL   Calcium 8.8 (L) 8.9 - 10.3 mg/dL   Total Protein 6.2 (L) 6.5 - 8.1 g/dL   Albumin 2.6 (L) 3.5 - 5.0 g/dL   AST 22 15 - 41 U/L   ALT 35 0 - 44 U/L   Alkaline Phosphatase 196 (H) 38 - 126 U/L   Total Bilirubin 0.6 0.3 - 1.2 mg/dL   GFR calc non Af Amer >60 >60 mL/min   GFR calc Af Amer >60 >60 mL/min   Anion gap 9 5 - 15    Comment: Performed at [facility], [address] [place], [place] [address]  C-reactive protein     Status: Abnormal   Collection Time: 01/07/19  8:18 PM  Result Value Ref Range   CRP 18.1 (H) <1.0 mg/dL    Comment: Performed at [facility], [address] [place], [place] [address]   Dg Ankle Complete Left  Result Date: 01/07/2019 CLINICAL DATA:  34 year old male with swelling and pain EXAM: LEFT ANKLE COMPLETE - 3+ VIEW COMPARISON:  None. FINDINGS: There is no evidence of fracture, dislocation, or joint effusion. There is no evidence of arthropathy or other focal bone abnormality. Soft tissues are unremarkable. IMPRESSION: Negative for acute bony abnormality Electronically Signed   By: Gilmer Mor D.O.   On: 01/07/2019 12:23   Dg Knee Complete 4 Views Right  Result Date: 01/07/2019 CLINICAL DATA:  Acute RIGHT knee pain and swelling for 5 days. No known injury. Initial encounter. EXAM: RIGHT KNEE - COMPLETE 4+ VIEW COMPARISON:  10/24/2018, 08/26/2014 and 05/01/2013 radiographs FINDINGS: A large knee effusion is present. There is no evidence of acute fracture, subluxation or dislocation. Mild tricompartmental joint space narrowing and osteophytosis noted. IMPRESSION: 1. Large knee effusion.  No  acute bony abnormality. 2. Mild tricompartmental degenerative changes. Electronically Signed   By: Harmon Pier M.D.   On: 01/07/2019 12:26  Pending Labs Unresulted Labs (From admission, onward)    Start     Ordered   01/08/19 0500  Basic metabolic panel  Tomorrow morning,   R     01/07/19 2059   01/08/19 0500  CBC  Tomorrow morning,   R     01/07/19 2059   01/07/19 2028  Gonococcus culture  (Joint Fluid/Tissue Labs)  Once,   R    Question:  Patient immune status  Answer:  Normal   01/07/19 2027   01/07/19 2027  Anaerobic culture  (Joint Fluid/Tissue Labs)  Once,   R    Question:  Patient immune status  Answer:  Normal   01/07/19 2027   01/07/19 1941  Sedimentation rate  ONCE - STAT,   STAT     01/07/19 1940   01/07/19 1922  Blood culture (routine x 2)  BLOOD CULTURE X 2,   STAT     01/07/19 1921   01/07/19 1921  SARS Coronavirus 2 (CEPHEID - Performed in [facility] hospital lab), Hosp Order  (Asymptomatic Patients Labs)  Once,   R    Question:  Rule Out  Answer:  Yes   01/07/19 1920   01/07/19 1428  Glucose, Body Fluid Other  (Arthrocentesis Panel)  ONCE - STAT,   STAT     01/07/19 1428   01/07/19 1428  Protein, body fluid (other)  (Arthrocentesis Panel)  ONCE - STAT,   STAT     01/07/19 1428          Vitals/Pain Today's Vitals   01/07/19 1930 01/07/19 2100 01/07/19 2113 01/07/19 2130  BP: 113/65 123/79    Pulse: 90 91 98 90  Resp:      Temp:      TempSrc:      SpO2: 97% 95% 96% 97%  Weight:      PainSc:        Isolation Precautions No active isolations  Medications Medications  vancomycin (VANCOCIN) 1,250 mg in sodium chloride 0.9 % 250 mL IVPB (1,250 mg Intravenous New Bag/Given 01/07/19 2050)  enoxaparin (LOVENOX) injection 40 mg (has no administration in time range)  acetaminophen (TYLENOL) tablet 650 mg (has no administration in time range)    Or  acetaminophen (TYLENOL) suppository 650 mg (has no administration in time range)  senna-docusate (Senokot-S)  tablet 1 tablet (has no administration in time range)  promethazine (PHENERGAN) tablet 12.5 mg (has no administration in time range)  methadone (DOLOPHINE) 10 MG/ML solution 69 mg (has no administration in time range)  OLANZapine (ZYPREXA) tablet 2.5 mg (has no administration in time range)  sertraline (ZOLOFT) tablet 100 mg (has no administration in time range)  nicotine (NICODERM CQ - dosed in mg/24 hours) patch 21 mg (has no administration in time range)  lidocaine-EPINEPHrine (XYLOCAINE W/EPI) 2 %-1:200000 (PF) injection 10 mL (10 mLs Infiltration Given 01/07/19 1658)    Mobility walks with device Moderate fall risk   Focused Assessments Cardiac Assessment Handoff:    No results found for: CKTOTAL, CKMB, CKMBINDEX, TROPONINI No results found for: DDIMER Does the Patient currently have chest pain? No      R Recommendations: See Admitting Provider Note  Report given to:   Additional Notes:  Pt requesting assistance when discharged with medications due to being homless

## 2019-01-07 NOTE — ED Triage Notes (Signed)
Pt with chronic knee pain states that he fell asleep in a odd position 5-6 days ago and has had increased knee pain.  Pt right knee is visibly swollen (larger than the other knee).  Pt also has swelling and pain in left ankle.  Pt states that he is homeless and uses IV drugs, meth and crack and whatever he can get. No fever or chills with this.

## 2019-01-07 NOTE — Consult Note (Signed)
Reason for Consult: Right knee swelling and pain, left ankle swelling and pain in the setting of IV drug abuse Referring Physician: [facility]  James Beard. is an 33 y.o. male.  HPI: Patient developed pain in his right knee about 5 days ago.  He is homeless has a history of bipolar disorder and is a known IV drug abuser.  He goes to the methadone clinic.  Patient states his knee began to become swollen and progressively worsened over the course of several days.  He presents emergency department with right knee pain and swelling.  He was afebrile and did not complain of fevers and chills.  Knee aspiration was performed and Gram stain demonstrated PMNs with a cell count of 48,000.  No organisms seen.  Orthopedics was consulted due to high white count and concern for septic arthritis.  Upon my evaluation patient complains of pain in the right knee.  He is able to ambulate although with some pain.  He is able to range his knee from about 0 to 60 degrees without significant discomfort.  Also complains of pain in the left posterior lateral ankle.  He states that this occurred at similar time to the right knee.  Again denies any fevers or chills.  States he does not inject any of his drugs in his lower extremities but only in the upper extremities.  Denies any other joint or extremity pain today.  Past Medical History:  Diagnosis Date  . Bipolar 1 disorder ([place])   . Eczema   . Homelessness   . IV drug abuse ([place])     History reviewed. No pertinent surgical history.  Family History  Problem Relation Age of Onset  . Diabetes Mother   . Hypertension Mother   . Depression Mother   . Diabetes Father   . Hypertension Father   . Depression Father     Social History:  reports that he has been smoking cigarettes. He has been smoking about 1.00 pack per day. He has never used smokeless tobacco. He reports previous alcohol use. He reports previous drug use. Drugs: Cocaine,  Marijuana, Methamphetamines, and IV.  Allergies: No Known Allergies  Medications: I have reviewed the patient's current medications.  Results for orders placed or performed during the hospital encounter of 01/07/19 (from the past 48 hour(s))  Body fluid culture     Status: None (Preliminary result)   Collection Time: 01/07/19  2:28 PM  Result Value Ref Range   Specimen Description KNEE    Special Requests RIGHT    Gram Stain      ABUNDANT WBC PRESENT, PREDOMINANTLY PMN NO ORGANISMS SEEN Performed at [facility], [address], [place], [place] [address]    Culture PENDING    Report Status PENDING   Synovial cell count + diff, w/ crystals     Status: Abnormal   Collection Time: 01/07/19  2:28 PM  Result Value Ref Range   Color, Synovial AMBER (A) YELLOW   Appearance-Synovial TURBID (A) CLEAR   Crystals, Fluid NO CRYSTALS SEEN    WBC, Synovial 48,000 (H) 0 - 200 /cu mm   Neutrophil, Synovial 95 (H) 0 - 25 %   Lymphocytes-Synovial Fld 0 0 - 20 %   Monocyte-Macrophage-Synovial Fluid 5 (L) 50 - 90 %   Eosinophils-Synovial 0 0 - 1 %    Comment: Performed at [facility] [address], [place], [place] [address]    Dg Ankle Complete Left  Result Date: 01/07/2019 CLINICAL DATA:  33 year old male with swelling and pain EXAM: LEFT ANKLE COMPLETE - 3+ VIEW COMPARISON:  None. FINDINGS: There is no evidence of fracture, dislocation, or joint effusion. There is no evidence of arthropathy or other focal bone abnormality. Soft tissues are unremarkable. IMPRESSION: Negative for acute bony abnormality Electronically Signed   By: Corrie Mckusick D.O.   On: 01/07/2019 12:23   Dg Knee Complete 4 Views Right  Result Date: 01/07/2019 CLINICAL DATA:  Acute RIGHT knee pain and swelling for 5 days. No known injury. Initial encounter. EXAM: RIGHT KNEE - COMPLETE 4+ VIEW COMPARISON:  10/24/2018, 08/26/2014 and 05/01/2013 radiographs FINDINGS: A large knee effusion is present. There is no  evidence of acute fracture, subluxation or dislocation. Mild tricompartmental joint space narrowing and osteophytosis noted. IMPRESSION: 1. Large knee effusion.  No acute bony abnormality. 2. Mild tricompartmental degenerative changes. Electronically Signed   By: Margarette Canada M.D.   On: 01/07/2019 12:26    Review of Systems  Constitutional: Negative.   HENT: Negative.   Eyes: Negative.   Respiratory: Negative.   Gastrointestinal: Negative.   Musculoskeletal:       Right knee pain and left ankle  Skin:       Excoriations about bilateral upper and lower extremities  Neurological: Negative.   Psychiatric/Behavioral: Positive for depression. The patient is nervous/anxious and has insomnia.    Blood pressure 113/65, pulse 90, temperature 98.6 F (37 C), temperature source Oral, resp. rate (!) 22, weight 63.5 kg, SpO2 97 %. Physical Exam  Constitutional: He appears well-developed.  HENT:  Head: Normocephalic.  Eyes: Conjunctivae are normal.  Cardiovascular: Normal rate.  Respiratory: Effort normal.  GI: Soft.  Musculoskeletal:     Comments: Right knee demonstrates swelling.  No erythema.  Tender to palpation.  Notable effusion.  Patient is able to range knee from full extension to about 60 degrees of flexion before becomes particularly uncomfortable.  No tenderness palpation about the thigh, leg, right ankle or foot.  Patient has active knee extension flexion.  Endorses sensation light touch about the distal extremity.  Foot is warm and well-perfused.  Evaluation of the left ankle demonstrates swelling and pain in the posterior lateral ankle along the peroneal tendons.  No pain with ankle joint range of motion passively or actively.  Does have pain with palpation along the peroneal tendons.  There is some erythema.  No tenderness to palpation or fluctuance noted proximal to the ankle.  No pain in the knee.  No knee effusion.  No pain with hip range of motion.  No evidence of right or left  upper extremity joint pain.  Skin:  Patient has significant amount of excoriations about bilateral upper extremities.  There is some that are scabbed and some that are bleeding.    Assessment/Plan: Patient certainly has concern for septic knee arthritis.  He also has some pain in the posterior lateral ankle with concern for peroneal tendon Tenosynovitis.  Does not appear to involve the ankle joint but will order an MRI scan to evaluate for underlying abscess versus septic tenosynovitis prior to surgical intervention.  Patients n.p.o. time was approximately 7:30 PM and therefore we should be able to obtain the MRI scan prior to surgery.  Keep patient n.p.o.  We did discuss the risk, benefits and alternatives of surgery which included wound healing complications, infection, continued pain, need for further surgery.  We also discussed the perioperative and anesthetic risk which include death.  Patient states he  has a methadone clinic appointment in the morning which she is concerned that he will miss.  Will defer any narcotic treatment to the hospitalist team.  We have ordered blood cultures, CBC, CMP, ESR and CRP.  I have ordered an MRI of the left ankle with and without contrast.  Erle Crocker 01/07/2019, 8:04 PM
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
## 2019-01-08 ENCOUNTER — Inpatient Hospital Stay (HOSPITAL_COMMUNITY): Payer: Self-pay

## 2019-01-08 ENCOUNTER — Encounter (HOSPITAL_COMMUNITY): Payer: Self-pay | Admitting: Critical Care Medicine

## 2019-01-08 ENCOUNTER — Inpatient Hospital Stay (HOSPITAL_COMMUNITY): Payer: Self-pay | Admitting: Critical Care Medicine

## 2019-01-08 ENCOUNTER — Encounter (HOSPITAL_COMMUNITY)
Admission: EM | Discharge status: Against Medical Advice | Payer: Self-pay | Source: Home / Self Care | Attending: Student in an Organized Health Care Education/Training Program

## 2019-01-08 DIAGNOSIS — M659 Synovitis and tenosynovitis, unspecified: Secondary | ICD-10-CM | POA: Diagnosis present

## 2019-01-08 DIAGNOSIS — M4620 Osteomyelitis of vertebra, site unspecified: Secondary | ICD-10-CM | POA: Diagnosis present

## 2019-01-08 DIAGNOSIS — M65972 Unspecified synovitis and tenosynovitis, left ankle and foot: Secondary | ICD-10-CM | POA: Diagnosis present

## 2019-01-08 DIAGNOSIS — G8921 Chronic pain due to trauma: Secondary | ICD-10-CM

## 2019-01-08 DIAGNOSIS — R011 Cardiac murmur, unspecified: Secondary | ICD-10-CM

## 2019-01-08 DIAGNOSIS — M1990 Unspecified osteoarthritis, unspecified site: Secondary | ICD-10-CM

## 2019-01-08 HISTORY — PX: I & D EXTREMITY: SHX5045

## 2019-01-08 LAB — BLOOD CULTURE ID PANEL (REFLEXED)

## 2019-01-08 LAB — CBC
HCT: 30.8 % — ABNORMAL LOW (ref 39.0–52.0)
Hemoglobin: 9.8 g/dL — ABNORMAL LOW (ref 13.0–17.0)
MCH: 28.7 pg (ref 26.0–34.0)
MCHC: 31.8 g/dL (ref 30.0–36.0)
MCV: 90.1 fL (ref 80.0–100.0)
Platelets: 292 10*3/uL (ref 150–400)
RBC: 3.42 MIL/uL — ABNORMAL LOW (ref 4.22–5.81)
RDW: 13 % (ref 11.5–15.5)
WBC: 8.6 10*3/uL (ref 4.0–10.5)
nRBC: 0 % (ref 0.0–0.2)

## 2019-01-08 LAB — ECHOCARDIOGRAM LIMITED
Height: 69.016 in
Weight: 2119.94 oz

## 2019-01-08 LAB — PROTEIN, BODY FLUID (OTHER): Total Protein, Body Fluid Other: 4.3 g/dL

## 2019-01-08 LAB — BASIC METABOLIC PANEL
Anion gap: 10 (ref 5–15)
BUN: 8 mg/dL (ref 6–20)
CO2: 27 mmol/L (ref 22–32)
Calcium: 8.3 mg/dL — ABNORMAL LOW (ref 8.9–10.3)
Chloride: 100 mmol/L (ref 98–111)
Creatinine, Ser: 0.62 mg/dL (ref 0.61–1.24)
GFR calc Af Amer: >60 mL/min (ref >60)
GFR calc non Af Amer: >60 mL/min (ref >60)
Glucose, Bld: 107 mg/dL — ABNORMAL HIGH (ref 70–99)
Potassium: 3.7 mmol/L (ref 3.5–5.1)
Sodium: 137 mmol/L (ref 135–145)

## 2019-01-08 LAB — SURGICAL PCR SCREEN
MRSA, PCR: NEGATIVE
Staphylococcus aureus: POSITIVE — AB

## 2019-01-08 SURGERY — IRRIGATION AND DEBRIDEMENT EXTREMITY
Anesthesia: General | Laterality: Bilateral

## 2019-01-08 MED ORDER — ACETAMINOPHEN 500 MG PO TABS: 1000.0000 mg | ORAL_TABLET | Freq: Once | ORAL | Status: DC | PRN

## 2019-01-08 MED ORDER — HYDROMORPHONE HCL 1 MG/ML IJ SOLN: 0.2500 mg | INTRAMUSCULAR | Status: DC | PRN

## 2019-01-08 MED ORDER — SODIUM CHLORIDE 0.9 % IV SOLN
2.0000 g | Freq: Two times a day (BID) | INTRAVENOUS | Status: DC
  Administered 2019-01-08 (×2): 2 g via INTRAVENOUS
  Filled 2019-01-08 (×4): qty 2

## 2019-01-08 MED ORDER — ENSURE ENLIVE PO LIQD
237.0000 mL | Freq: Three times a day (TID) | ORAL | Status: DC
  Administered 2019-01-08 – 2019-01-31 (×58): 237 mL via ORAL
  Filled 2019-01-08 (×2): qty 237

## 2019-01-08 MED ORDER — PRO-STAT SUGAR FREE PO LIQD
30.0000 mL | Freq: Two times a day (BID) | ORAL | Status: DC
  Administered 2019-01-08 – 2019-01-31 (×46): 30 mL via ORAL
  Filled 2019-01-08 (×46): qty 30

## 2019-01-08 MED ORDER — ACETAMINOPHEN 10 MG/ML IV SOLN: 1000.0000 mg | Freq: Once | INTRAVENOUS | Status: DC | PRN

## 2019-01-08 MED ORDER — SERTRALINE HCL 50 MG PO TABS
50.0000 mg | ORAL_TABLET | Freq: Every day | ORAL | Status: DC
  Administered 2019-01-08 – 2019-01-31 (×24): 50 mg via ORAL
  Filled 2019-01-08 (×24): qty 1

## 2019-01-08 MED ORDER — FENTANYL CITRATE (PF) 250 MCG/5ML IJ SOLN
INTRAMUSCULAR | Status: AC
  Filled 2019-01-08: qty 5

## 2019-01-08 MED ORDER — DEXAMETHASONE SODIUM PHOSPHATE 10 MG/ML IJ SOLN
INTRAMUSCULAR | Status: DC | PRN
  Administered 2019-01-08: 5 mg via INTRAVENOUS

## 2019-01-08 MED ORDER — ONDANSETRON HCL 4 MG/2ML IJ SOLN
INTRAMUSCULAR | Status: DC | PRN
  Administered 2019-01-08: 4 mg via INTRAVENOUS

## 2019-01-08 MED ORDER — DEXAMETHASONE SODIUM PHOSPHATE 10 MG/ML IJ SOLN
INTRAMUSCULAR | Status: AC
  Filled 2019-01-08: qty 1

## 2019-01-08 MED ORDER — VANCOMYCIN HCL 10 G IV SOLR
1250.0000 mg | Freq: Two times a day (BID) | INTRAVENOUS | Status: DC
  Administered 2019-01-08: 1250 mg via INTRAVENOUS
  Filled 2019-01-08 (×2): qty 1250

## 2019-01-08 MED ORDER — MIDAZOLAM HCL 5 MG/5ML IJ SOLN
INTRAMUSCULAR | Status: DC | PRN
  Administered 2019-01-08: 2 mg via INTRAVENOUS

## 2019-01-08 MED ORDER — OXYCODONE HCL 5 MG/5ML PO SOLN: 5.0000 mg | Freq: Once | ORAL | Status: DC | PRN

## 2019-01-08 MED ORDER — METHADONE HCL 10 MG PO TABS
70.0000 mg | ORAL_TABLET | Freq: Every day | ORAL | Status: DC
  Administered 2019-01-08 – 2019-01-31 (×24): 70 mg via ORAL
  Filled 2019-01-08 (×24): qty 7

## 2019-01-08 MED ORDER — FENTANYL CITRATE (PF) 250 MCG/5ML IJ SOLN
INTRAMUSCULAR | Status: DC | PRN
  Administered 2019-01-08 (×3): 50 ug via INTRAVENOUS

## 2019-01-08 MED ORDER — GADOBUTROL 1 MMOL/ML IV SOLN
7.0000 mL | Freq: Once | INTRAVENOUS | Status: AC | PRN
  Administered 2019-01-08: 7 mL via INTRAVENOUS

## 2019-01-08 MED ORDER — PROPOFOL 10 MG/ML IV BOLUS
INTRAVENOUS | Status: DC | PRN
  Administered 2019-01-08: 160 mg via INTRAVENOUS
  Administered 2019-01-08: 20 mg via INTRAVENOUS

## 2019-01-08 MED ORDER — LACTATED RINGERS IV SOLN
INTRAVENOUS | Status: DC
  Administered 2019-01-08: 10:00:00 via INTRAVENOUS

## 2019-01-08 MED ORDER — MIDAZOLAM HCL 2 MG/2ML IJ SOLN
INTRAMUSCULAR | Status: AC
  Filled 2019-01-08: qty 2

## 2019-01-08 MED ORDER — PROPOFOL 10 MG/ML IV BOLUS
INTRAVENOUS | Status: AC
  Filled 2019-01-08: qty 20

## 2019-01-08 MED ORDER — SODIUM CHLORIDE 0.9 % IR SOLN
Status: DC | PRN
  Administered 2019-01-08: 3000 mL

## 2019-01-08 MED ORDER — SUCCINYLCHOLINE CHLORIDE 200 MG/10ML IV SOSY
PREFILLED_SYRINGE | INTRAVENOUS | Status: DC | PRN
  Administered 2019-01-08: 70 mg via INTRAVENOUS

## 2019-01-08 MED ORDER — ACETAMINOPHEN 160 MG/5ML PO SOLN: 1000.0000 mg | Freq: Once | ORAL | Status: DC | PRN

## 2019-01-08 MED ORDER — 0.9 % SODIUM CHLORIDE (POUR BTL) OPTIME
TOPICAL | Status: DC | PRN
  Administered 2019-01-08: 1000 mL

## 2019-01-08 MED ORDER — ONDANSETRON HCL 4 MG/2ML IJ SOLN
INTRAMUSCULAR | Status: AC
  Filled 2019-01-08: qty 2

## 2019-01-08 MED ORDER — LIDOCAINE 2% (20 MG/ML) 5 ML SYRINGE
INTRAMUSCULAR | Status: DC | PRN
  Administered 2019-01-08: 40 mg via INTRAVENOUS

## 2019-01-08 MED ORDER — ADULT MULTIVITAMIN W/MINERALS CH
1.0000 | ORAL_TABLET | Freq: Every day | ORAL | Status: DC
  Administered 2019-01-09 – 2019-01-31 (×23): 1 via ORAL
  Filled 2019-01-08 (×24): qty 1

## 2019-01-08 MED ORDER — CEFAZOLIN SODIUM-DEXTROSE 2-4 GM/100ML-% IV SOLN
2.0000 g | Freq: Three times a day (TID) | INTRAVENOUS | Status: DC
  Administered 2019-01-08 – 2019-01-31 (×69): 2 g via INTRAVENOUS
  Filled 2019-01-08 (×76): qty 100

## 2019-01-08 MED ORDER — OXYCODONE HCL 5 MG PO TABS: 5.0000 mg | ORAL_TABLET | Freq: Once | ORAL | Status: DC | PRN

## 2019-01-08 SURGICAL SUPPLY — 49 items
BANDAGE ELASTIC 4 VELCRO ST LF (GAUZE/BANDAGES/DRESSINGS) ×6 IMPLANT
BANDAGE ELASTIC 6 VELCRO ST LF (GAUZE/BANDAGES/DRESSINGS) ×6 IMPLANT
BANDAGE ESMARK 6X9 LF (GAUZE/BANDAGES/DRESSINGS) IMPLANT
BNDG COHESIVE 4X5 TAN STRL (GAUZE/BANDAGES/DRESSINGS) IMPLANT
BNDG ESMARK 4X9 LF (GAUZE/BANDAGES/DRESSINGS) IMPLANT
BNDG ESMARK 6X9 LF (GAUZE/BANDAGES/DRESSINGS)
BNDG GAUZE ELAST 4 BULKY (GAUZE/BANDAGES/DRESSINGS) ×3 IMPLANT
CLOSURE WOUND 1/2 X4 (GAUZE/BANDAGES/DRESSINGS) ×1
CONT SPEC 4OZ CLIKSEAL STRL BL (MISCELLANEOUS) ×6 IMPLANT
COVER SURGICAL LIGHT HANDLE (MISCELLANEOUS) ×3 IMPLANT
COVER WAND RF STERILE (DRAPES) IMPLANT
DRAPE U-SHAPE 47X51 STRL (DRAPES) ×3 IMPLANT
DRSG PAD ABDOMINAL 8X10 ST (GAUZE/BANDAGES/DRESSINGS) IMPLANT
DRSG XEROFORM 1X8 (GAUZE/BANDAGES/DRESSINGS) IMPLANT
ELECT REM PT RETURN 9FT ADLT (ELECTROSURGICAL) ×3
ELECTRODE REM PT RTRN 9FT ADLT (ELECTROSURGICAL) ×1 IMPLANT
EVACUATOR 1/8 PVC DRAIN (DRAIN) ×6 IMPLANT
GAUZE SPONGE 4X4 12PLY STRL (GAUZE/BANDAGES/DRESSINGS) ×3 IMPLANT
GAUZE XEROFORM 1X8 LF (GAUZE/BANDAGES/DRESSINGS) ×3 IMPLANT
GAUZE XEROFORM 5X9 LF (GAUZE/BANDAGES/DRESSINGS) ×6 IMPLANT
GLOVE BIOGEL M STRL SZ7.5 (GLOVE) ×3 IMPLANT
GLOVE INDICATOR 8.0 STRL GRN (GLOVE) ×3 IMPLANT
GOWN STRL REUS W/ TWL LRG LVL3 (GOWN DISPOSABLE) ×1 IMPLANT
GOWN STRL REUS W/TWL LRG LVL3 (GOWN DISPOSABLE) ×2
GOWN STRL REUS W/TWL XL LVL3 (GOWN DISPOSABLE) ×3 IMPLANT
HANDPIECE INTERPULSE COAX TIP (DISPOSABLE) ×2
KIT BASIN OR (CUSTOM PROCEDURE TRAY) ×3 IMPLANT
KIT TURNOVER KIT B (KITS) ×3 IMPLANT
MANIFOLD NEPTUNE II (INSTRUMENTS) IMPLANT
NEEDLE 22X1 1/2 (OR ONLY) (NEEDLE) IMPLANT
NS IRRIG 1000ML POUR BTL (IV SOLUTION) ×3 IMPLANT
PACK ORTHO EXTREMITY (CUSTOM PROCEDURE TRAY) ×3 IMPLANT
PAD ARMBOARD 7.5X6 YLW CONV (MISCELLANEOUS) ×6 IMPLANT
PAD CAST 4YDX4 CTTN HI CHSV (CAST SUPPLIES) ×2 IMPLANT
PADDING CAST COTTON 4X4 STRL (CAST SUPPLIES) ×4
SET CYSTO W/LG BORE CLAMP LF (SET/KITS/TRAYS/PACK) ×3 IMPLANT
SET HNDPC FAN SPRY TIP SCT (DISPOSABLE) ×1 IMPLANT
SPONGE LAP 18X18 RF (DISPOSABLE) ×3 IMPLANT
STAPLER VISISTAT 35W (STAPLE) ×3 IMPLANT
STOCKINETTE IMPERVIOUS 9X36 MD (GAUZE/BANDAGES/DRESSINGS) IMPLANT
STRIP CLOSURE SKIN 1/2X4 (GAUZE/BANDAGES/DRESSINGS) ×2 IMPLANT
SUT ETHILON 2 0 FS 18 (SUTURE) IMPLANT
SUT PDS AB 0 CT 36 (SUTURE) ×3 IMPLANT
SUT PDS AB 2-0 CT1 27 (SUTURE) ×6 IMPLANT
TUBE CONNECTING 12'X1/4 (SUCTIONS) ×1
TUBE CONNECTING 12X1/4 (SUCTIONS) ×2 IMPLANT
TUBING TUR DISP (UROLOGICAL SUPPLIES) IMPLANT
UNDERPAD 30X30 (UNDERPADS AND DIAPERS) ×3 IMPLANT
YANKAUER SUCT BULB TIP NO VENT (SUCTIONS) ×3 IMPLANT

## 2019-01-08 NOTE — Transfer of Care (Signed)
Immediate Anesthesia Transfer of Care Note  Patient: James Beard.  Procedure(s) Performed: IRRIGATION AND DEBRIDEMENT OF ABSCESS RIGHT KNEE AND LEFT ANKLE (Bilateral )  Patient Location: PACU  Anesthesia Type:General  Level of Consciousness: sedated  Airway & Oxygen Therapy: Patient Spontanous Breathing and Patient connected to nasal cannula oxygen  Post-op Assessment: Report given to RN and Post -op Vital signs reviewed and stable  Post vital signs: Reviewed and stable  Last Vitals:  Vitals Value Taken Time  BP 113/67 01/08/2019 12:25 PM  Temp    Pulse 65 01/08/2019 12:27 PM  Resp 9 01/08/2019 12:27 PM  SpO2 99 % 01/08/2019 12:27 PM  Vitals shown include unvalidated device data.  Last Pain:  Vitals:   01/08/19 0956  TempSrc: Oral  PainSc: 0-No pain      Patients Stated Pain Goal: 4 (01/08/19 0956)  Complications: No apparent anesthesia complications

## 2019-01-08 NOTE — Progress Notes (Signed)
Subjective: Patient complains of continued right knee pain and left ankle pain.  Patient also noted back pain which he did not know to me previously.  He has been n.p.o.  He is ready for I&D of his right knee and left peroneal tendons for tenosynovitis and abscess.  Overnight MRI was performed of his left ankle which showed tenosynovitis around the peroneal tendons with an abscess in the posterior soft tissues.  He also had an MRI of his lumbar spine which demonstrated paraspinal abscess.  Neurosurgery was consulted and decided no surgery was indicated.  He also was found to have a heart murmur and echo is pending.   Objective: Vital signs in last 24 hours: Temp:  [97.9 F (36.6 C)-100.9 F (38.3 C)] 97.9 F (36.6 C) (05/05 0956) Pulse Rate:  [71-101] 71 (05/05 0956) Resp:  [16-22] 16 (05/05 0956) BP: (113-148)/(63-93) 125/63 (05/05 0956) SpO2:  [95 %-100 %] 100 % (05/05 0956) Weight:  [60.1 kg-63.5 kg] 60.1 kg (05/05 0500)  Intake/Output from previous day: 05/04 0701 - 05/05 0700 In: 100 [IV Piggyback:100] Out: 450 [Urine:450] Intake/Output this shift: No intake/output data recorded.  Recent Labs    01/07/19 2017 01/08/19 0314  HGB 10.4* 9.8*   Recent Labs    01/07/19 2017 01/08/19 0314  WBC 7.8 8.6  RBC 3.62* 3.42*  HCT 32.5* 30.8*  PLT 337 292   Recent Labs    01/07/19 2017 01/08/19 0314  NA 137 137  K 3.5 3.7  CL 99 100  CO2 29 27  BUN 9 8  CREATININE 0.56* 0.62  GLUCOSE 100* 107*  CALCIUM 8.8* 8.3*   No results for input(s): LABPT, INR in the last 72 hours.  Right knee demonstrates effusion and swelling.  Tender to palpation.  Pain with range of motion.  Able to fully extend and flex about 60 degrees.  No significant erythema.  Sensation and motor intact distally.  Left ankle demonstrates tenderness and swelling around the peroneal tendons.  There is fluctuance noted.  He has pain with hindfoot motion and resisted hindfoot eversion.  Motor and sensation  intact distally.  Foot is warm and well-perfused.    Assessment/Plan: We will proceed with incision and drainage of his right knee and debridement of his peroneal tendons and deep abscess of the left ankle.  We will take cultures as well as AFB and fungal and attempt to isolate bacteria.  He was positive for MRSA in the nares.     Terance Hart 01/08/2019, 10:51 AM

## 2019-01-08 NOTE — Progress Notes (Signed)
  Echocardiogram 2D Echocardiogram has been performed.  James Beard 01/08/2019, 4:20 PM

## 2019-01-08 NOTE — Progress Notes (Signed)
This RN took patients knife and brown knife holder to security. Knife was placed in safe downstairs. Pt aware and pt's belongings form signs. RN will deliver message off to on coming RN.

## 2019-01-08 NOTE — Anesthesia Procedure Notes (Signed)
Procedure Name: Intubation Date/Time: 01/08/2019 11:04 AM Performed by: Wilburn Cornelia, CRNA Pre-anesthesia Checklist: Patient identified, Emergency Drugs available, Suction available, Patient being monitored and Timeout performed Patient Re-evaluated:Patient Re-evaluated prior to induction Oxygen Delivery Method: Circle system utilized Preoxygenation: Pre-oxygenation with 100% oxygen Induction Type: IV induction and Rapid sequence Laryngoscope Size: Mac and 4 Grade View: Grade I Tube type: Oral Tube size: 7.5 mm Number of attempts: 1 Airway Equipment and Method: Stylet Placement Confirmation: ETT inserted through vocal cords under direct vision,  positive ETCO2,  CO2 detector and breath sounds checked- equal and bilateral Secured at: 23 cm Tube secured with: Tape Dental Injury: Teeth and Oropharynx as per pre-operative assessment

## 2019-01-08 NOTE — Progress Notes (Signed)
Pharmacy Antibiotic Note  James Olesh. is a 33 y.o. male admitted on 01/07/2019 with septic arthritis and paraspinal abscess.  Pharmacy has been consulted for Vancomycin dosing. Patient has h/o IVDU and is s/p I&D of knee. Cultures from the knee are growing GPC on Gram stain. Patient received dose of vancomycin on admission on 5/4 at ~2000  Plan: Vancomycin 1250mg  IV q12h Expected AUC 470 Scr used 0.62 Cefepime per MD Vancomycin levels as needed Monitor for clinical course, cultures, and for deescalation.   Height: 5' 9.02" (175.3 cm) Weight: 132 lb 7.9 oz (60.1 kg) IBW/kg (Calculated) : 70.74  Temp (24hrs), Avg:98.7 F (37.1 C), Min:97.7 F (36.5 C), Max:100.9 F (38.3 C)  Recent Labs  Lab 01/07/19 2017 01/08/19 0314  WBC 7.8 8.6  CREATININE 0.56* 0.62    Estimated Creatinine Clearance: 110.6 mL/min (by C-G formula based on SCr of 0.62 mg/dL).    No Known Allergies  Quamere Mussell A. Jeanella Craze, PharmD, BCPS Clinical Pharmacist [place] Please utilize Amion for appropriate phone number to reach the unit pharmacist [facility])   01/08/2019 4:04 PM

## 2019-01-08 NOTE — Progress Notes (Signed)
Patient back from surgery. VSS. Patient sleeping.
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
## 2019-01-08 NOTE — Progress Notes (Signed)
Pharmacy Antibiotic Note  Rexall Maybank. is a 33 y.o. male admitted on 01/07/2019 with septic arthritis and paraspinal abscess.  Patient has a history of IVDU and is s/p I&D of knee. Cultures from the knee are growing GPC.  BCID revealed MSSA.  Pharmacy consulted to narrow antibiotics to Ancef.  Renal function is stable, afebrile, WBC WNL.  Plan: Ancef 2gm IV Q8H Monitor renal fxn, clinical progress F/U repeat BCx and evaluation for endocarditis  Height: 5' 9.02" (175.3 cm) Weight: 132 lb 7.9 oz (60.1 kg) IBW/kg (Calculated) : 70.74  Temp (24hrs), Avg:98.7 F (37.1 C), Min:97.7 F (36.5 C), Max:100.9 F (38.3 C)  Recent Labs  Lab 01/07/19 2017 01/08/19 0314  WBC 7.8 8.6  CREATININE 0.56* 0.62    Estimated Creatinine Clearance: 110.6 mL/min (by C-G formula based on SCr of 0.62 mg/dL).    No Known Allergies   Cefepime 5/4 >> 5/5 Vanc 5/5 >> 5/5 Ancef 5/5 >>  5/4 covid - negative 5/4 surgical screen - positive MSSA 5/4 BCx - GPC (BCID MSSA) 5/5 right knee fluid cx, spec A - NGTD 5/5 right knee fluid cx, spec B - NGTD 5/5 right knee fluid - GPC  Disaya Walt D. Laney Potash, PharmD, BCPS, BCCCP 01/08/2019, 8:14 PM

## 2019-01-08 NOTE — Plan of Care (Signed)
  Problem: Pain Managment: Goal: General experience of comfort will improve Outcome: Not Progressing  C/o constant pain. Medication provided as orded

## 2019-01-08 NOTE — Progress Notes (Signed)
PHARMACY - PHYSICIAN COMMUNICATION CRITICAL VALUE ALERT - BLOOD CULTURE IDENTIFICATION (BCID)  James Beard. is an 33 y.o. male who presented to [facility] on 01/07/2019 with a chief complaint of right knee and left ankle pain.  Assessment: History if IVDU and injected heroin the day prior to admission.  Started on broad spectrum antibiotics for septic arthritis/paraspinal abscess.  Patient is s/p washout on 01/08/19.  Right knee fluid culture is growing GPC and BCID showed MSSA.  Name of physician (or Provider) Contacted: Dr. Delma Officer  Current antibiotics:  Vancomycin and cefepime  Changes to prescribed antibiotics recommended:  Recommendations accepted by provider  Narrow to Ancef  Results for orders placed or performed during the hospital encounter of 01/07/19  Blood Culture ID Panel (Reflexed) (Collected: 01/07/2019  8:17 PM)  Result Value Ref Range   Enterococcus species NOT DETECTED NOT DETECTED   Listeria monocytogenes NOT DETECTED NOT DETECTED   Staphylococcus species DETECTED (A) NOT DETECTED   Staphylococcus aureus (BCID) DETECTED (A) NOT DETECTED   Methicillin resistance NOT DETECTED NOT DETECTED   Streptococcus species NOT DETECTED NOT DETECTED   Streptococcus agalactiae NOT DETECTED NOT DETECTED   Streptococcus pneumoniae NOT DETECTED NOT DETECTED   Streptococcus pyogenes NOT DETECTED NOT DETECTED   Acinetobacter baumannii NOT DETECTED NOT DETECTED   Enterobacteriaceae species NOT DETECTED NOT DETECTED   Enterobacter cloacae complex NOT DETECTED NOT DETECTED   Escherichia coli NOT DETECTED NOT DETECTED   Klebsiella oxytoca NOT DETECTED NOT DETECTED   Klebsiella pneumoniae NOT DETECTED NOT DETECTED   Proteus species NOT DETECTED NOT DETECTED   Serratia marcescens NOT DETECTED NOT DETECTED   Haemophilus influenzae NOT DETECTED NOT DETECTED   Neisseria meningitidis NOT DETECTED NOT DETECTED   Pseudomonas aeruginosa NOT DETECTED NOT DETECTED   Candida albicans NOT  DETECTED NOT DETECTED   Candida glabrata NOT DETECTED NOT DETECTED   Candida krusei NOT DETECTED NOT DETECTED   Candida parapsilosis NOT DETECTED NOT DETECTED   Candida tropicalis NOT DETECTED NOT DETECTED    Keylan Costabile D. Laney Potash, PharmD, BCPS, BCCCP 01/08/2019, 8:09 PM

## 2019-01-08 NOTE — Progress Notes (Signed)
PT Cancellation Note  Patient Details Name: James Beard. MRN: [id number] DOB: December 31, 1985   Cancelled Treatment:    Reason Eval/Treat Not Completed: Patient at procedure or test/unavailable Pt currently in OR. Will follow up as schedule allows.   Gladys Damme, PT, DPT  Acute Rehabilitation Services  Pager: [phone] Office: [phone]    Lehman Prom 01/08/2019, 10:29 AM

## 2019-01-08 NOTE — Op Note (Signed)
James Beard. male 33 y.o. 01/08/2019  PreOperative Diagnosis: Right knee septic arthritis Left ankle deep abscess Left peroneal tendons septic tenosynovitis  PostOperative Diagnosis: Same  PROCEDURE: Incision and drainage right septic knee Partial synovectomy right knee Incision and drainage left ankle deep abscess Exploration and excisional debridement of peroneal tendon septic tenosynovitis Excisional debridement of peroneal tendon sheath for septic tenosynovitis  SURGEON: Melony Overly, MD  ASSISTANT: None  ANESTHESIA: General LMA  FINDINGS: Milky purulent fluid found within the right knee with necrotic fat and synovial tissue Deep abscess on the lateral aspect of right ankle extra-articular Septic tenosynovitis with purulent tenosynovial fluid and necrotic tenosynovium surrounding the peroneus longus and brevis and about the peroneal tendon sheath  IMPLANTS: None  INDICATIONS:32 y.o. male known poly-drug user presented to the emergency department with 5 days of worsening right knee and left ankle pain.  Patient admits to heroin, crystal meth, other drug abuse problem.  He attends methadone clinic.  Right knee was aspirated in the emergency department came back with 48,000 white blood cells and positive Gram stain with gram-positive cocci.  MRI of the left ankle was obtained and showed deep abscess as well as peroneal tendon septic tenosynovitis.  MRI of the lumbar spine was also obtained which showed a perispinal abscess.  Neurosurgery was consulted and did not recommend surgery for this.  Patient had a fever of 100.9 but had normal white count.  ESR and CRP were elevated in the emergency department.  He was admitted to the hospitalist service.  He has had a history of abscesses.  He did have positive MRSA on nasal swab.  Upon evaluation he was indicated for incision and drainage with partial synovectomy of the right knee as well as incision and drainage of left  ankle deep abscess and peroneal tendon tenosynovitis.  We discussed the risk benefits alternatives surgery which included wound healing complications, continued infection, need for further surgery.  We also discussed the perioperative and anesthetic risk which include death.  After weighing these risks he opted to proceed with surgery.  PROCEDURE: Patient was identified the preoperative holding area.  Consent was signed by myself and the patient.  The right knee was marked by myself.  The left ankle was marked by myself.  The patient was taken to the operative suite placed upon the operative table.  General anesthesia was induced without difficulty.  All bony prominences well-padded.  Bilateral lower extremities were prepped and draped in usual sterile fashion.  Surgical timeout was performed.  He was given preoperative antibiotics as cultures had previously been obtained.  We began by making a 4 cm incision in the lateral parapatellar region of the right knee.  This taken sharply down through skin subcutaneous tissue.  Then the blunt dissection was used to identify the lateral aspect of the patella and the joint capsule which was distended.  Using tenotomy scissors the joint capsule was entered and a large rush of milky purulent fluid appeared.  This was milked from the knee joint.  Then the arthrotomy was extended proximally and distally.  There is a large amount of necrotic fat and synovial tissue.  Sharply using a 15 blade synovial tissue was excised.  Then using a rondure surgical cultures were obtained.  This was tissue and fluid culture.  This was sent for anaerobic aerobic, AFB and fungal.  After adequate debridement and excisional partial synovectomy the knee was irrigated with sterile saline using cystoscopy tubing.  Several liters were used.  Then a medium Hemovac drain was placed.  The knee capsular tissue was closed with 0 PDS suture.  The subcuticular tissue was closed with 2-0 PDS suture and the  skin with staples.  We then turned our attention to the left ankle.  The left ankle was noted to have a large area of fluctuance in the posterior lateral ankle.  An incision was made in line with the peroneal tendons overlying the abscess that was posterior to the fibula extending down into the lateral foot region.  Upon skin incision the fascia was identified and violated sharply with 15 blade.  Then a large rush of purulent milky fluid was noted.  This was obtained for culture.  Then the abscess cavity was bluntly dissected and necrotic fat was excisionally debrided with scissors and 15 blade.  Then the incision was extended proximally and distally and the peroneal tendon sheath was identified.  This was bulging.  This was entered sharply with a 15 blade.  The synovial fluid was milky and appeared purulent.  This was obtained for culture as well.  There was necrotic synovial tissue and fat in and around the tendon sheath and peroneus longus and brevis tendons.  This tissue was excisionally debrided and sent for culture and pathology.  Inspection of the tendons was performed and dissecting scissors and 15 blade were used to excisionally debride the tendons and tendon sheath.  Then the tendons, tendon sheath and abscess was irrigated with sterile saline using cystoscopy tubing.  Copious amounts were used.  Then a second Hemovac drain was placed here.  The peroneal tendon retinacular tissue and superior peroneal retinaculum tissue was closed with 0 PDS.  Then the subcuticular tissue was closed with a 2-0 PDS.  The skin was closed with staples.  Then Xeroform, 4 x 4's and sterile she cotton were placed about both wounds.  4 and 6 inch Ace wraps were used.  The drains were placed to suction.  All counts were correct at the end of the case.  There were no complications.  The patient was awakened from anesthesia and taken to recovery room in stable condition.  POST OPERATIVE INSTRUCTIONS: Weightbearing as  tolerated bilateral lower extremities. Will place a walking boot to the left lower extremity to protect the peroneal retinaculum We will await surgical cultures Antibiotic choice per hospitalist and infectious disease team We will record drain output and DC the drains once output less than 30 mL per shift Follow-up in 2 weeks for wound check.  TOURNIQUET TIME: None  BLOOD LOSS:  less than 50 mL         DRAINS: none         SPECIMEN: none       COMPLICATIONS:  * No complications entered in OR log *         Disposition: PACU - hemodynamically stable.         Condition: stable

## 2019-01-08 NOTE — Progress Notes (Signed)
Initial Nutrition Assessment  RD working remotely.  DOCUMENTATION CODES:   Not applicable, suspect some degree of malnutrition given significant weight loss but unable to diagnose at this time without completion of NFPE  INTERVENTION:   - Increase Ensure Enlive po to TID, each supplement provides 350 kcal and 20 grams of protein  - Add Pro-stat 30 ml BID, each supplement provides 100 kcal and 15 grams of protein  - MVI with minerals daily  NUTRITION DIAGNOSIS:   Increased nutrient needs related to post-op healing as evidenced by estimated needs.  GOAL:   Patient will meet greater than or equal to 90% of their needs  MONITOR:   PO intake, Supplement acceptance, Weight trends, Diet advancement, Labs  REASON FOR ASSESSMENT:   Malnutrition Screening Tool    ASSESSMENT:   33 year old male who presented to the ED on 5/04 with knee pain. PMH of bipolar disorder, homelessness, and IV drug use. Arthrocentesis of right knee performed at bedside in the ED. Pt found to have septic arthritis of right knee, tenosynovitis of left ankle, and paraspinal abscess.  5/05 - s/p I&D of right knee and debridement of peroneal tendons and deep abscess of left ankle  Unable to speak with pt today as pt was in OR/PACU on all attempts. Will attempt to obtain diet and weight history from pt at a later date.  Reviewed weight history in chart. Pt with progressive weight loss since March 2020. Overall, pt has lost 14.7 kg (32.3 lbs) since 12/04/18. This is a 19.7% weight loss in less than 2 months which is severe and significant for timeframe.  Given significant weight loss and homelessness, suspect pt with some degree of malnutrition related to social/environmental circumstances but unable to diagnose at this time without speaking with pt and/or completing NFPE.  Will increase Ensure Enlive from BID to TID and add Pro-stat to aid pt in meeting kcal and protein needs. Will also order daily MVI with  minerals.  Medications reviewed and include: Ensure Enlive BID, Zyprexa, IV abx IVF: LR @ 10 ml/hr  Labs reviewed: hemoglobin 9.8 (L)  NUTRITION - FOCUSED PHYSICAL EXAM:  Unable to complete at this time. RD working remotely.  Diet Order:   Diet Order            Diet NPO time specified  Diet effective midnight              EDUCATION NEEDS:   Not appropriate for education at this time  Skin:  Skin Assessment: Skin Integrity Issues: Skin Integrity Issues:: Incisions Incisions: closed incisions to right knee, left ankle  Last BM:  no documented BM  Height:   Ht Readings from Last 1 Encounters:  01/07/19 5' 9.02" (1.753 m)    Weight:   Wt Readings from Last 1 Encounters:  01/08/19 60.1 kg    Ideal Body Weight:  72.8 kg  BMI:  Body mass index is 19.56 kg/m.  Estimated Nutritional Needs:   Kcal:  2000-2200  Protein:  100-115 grams  Fluid:  2.0-2.2 L    Earma Reading, MS, RD, LDN Inpatient Clinical Dietitian Pager: [phone] Weekend/After Hours: [phone]

## 2019-01-08 NOTE — Anesthesia Preprocedure Evaluation (Addendum)
Anesthesia Evaluation  Patient identified by MRN, date of birth, ID band Patient awake    Reviewed: Allergy & Precautions, NPO status , Patient's Chart, lab work & pertinent test results  Airway Mallampati: II  TM Distance: >3 FB Neck ROM: Full    Dental  (+) Missing, Poor Dentition, Chipped, Dental Advisory Given   Pulmonary Current Smoker,    breath sounds clear to auscultation       Cardiovascular negative cardio ROS   Rhythm:Regular     Neuro/Psych PSYCHIATRIC DISORDERS Depression Bipolar Disorder negative neurological ROS     GI/Hepatic negative GI ROS, (+)     substance abuse  ,   Endo/Other  negative endocrine ROS  Renal/GU negative Renal ROS     Musculoskeletal  (+) Arthritis , Septic joint   Abdominal   Peds  Hematology  (+) anemia ,   Anesthesia Other Findings   Reproductive/Obstetrics                            Anesthesia Physical Anesthesia Plan  ASA: III  Anesthesia Plan: General   Post-op Pain Management:    Induction: Intravenous and Rapid sequence  PONV Risk Score and Plan: 1 and Ondansetron and Dexamethasone  Airway Management Planned: Oral ETT  Additional Equipment: None  Intra-op Plan:   Post-operative Plan: Extubation in OR  Informed Consent: I have reviewed the patients History and Physical, chart, labs and discussed the procedure including the risks, benefits and alternatives for the proposed anesthesia with the patient or authorized representative who has indicated his/her understanding and acceptance.     Dental advisory given  Plan Discussed with: CRNA and Surgeon  Anesthesia Plan Comments:         Anesthesia Quick Evaluation

## 2019-01-09 ENCOUNTER — Encounter (HOSPITAL_COMMUNITY): Payer: Self-pay | Admitting: Orthopaedic Surgery

## 2019-01-09 DIAGNOSIS — F319 Bipolar disorder, unspecified: Secondary | ICD-10-CM

## 2019-01-09 DIAGNOSIS — Z978 Presence of other specified devices: Secondary | ICD-10-CM

## 2019-01-09 DIAGNOSIS — F17210 Nicotine dependence, cigarettes, uncomplicated: Secondary | ICD-10-CM

## 2019-01-09 DIAGNOSIS — M00061 Staphylococcal arthritis, right knee: Principal | ICD-10-CM

## 2019-01-09 DIAGNOSIS — L309 Dermatitis, unspecified: Secondary | ICD-10-CM

## 2019-01-09 DIAGNOSIS — G061 Intraspinal abscess and granuloma: Secondary | ICD-10-CM

## 2019-01-09 DIAGNOSIS — K029 Dental caries, unspecified: Secondary | ICD-10-CM

## 2019-01-09 LAB — BASIC METABOLIC PANEL
Anion gap: 11 (ref 5–15)
BUN: 14 mg/dL (ref 6–20)
CO2: 29 mmol/L (ref 22–32)
Calcium: 8.5 mg/dL — ABNORMAL LOW (ref 8.9–10.3)
Chloride: 103 mmol/L (ref 98–111)
Creatinine, Ser: 0.59 mg/dL — ABNORMAL LOW (ref 0.61–1.24)
GFR calc Af Amer: >60 mL/min (ref >60)
GFR calc non Af Amer: >60 mL/min (ref >60)
Glucose, Bld: 146 mg/dL — ABNORMAL HIGH (ref 70–99)
Potassium: 4.2 mmol/L (ref 3.5–5.1)
Sodium: 143 mmol/L (ref 135–145)

## 2019-01-09 LAB — HIV ANTIBODY (ROUTINE TESTING W REFLEX): HIV Screen 4th Generation wRfx: NONREACTIVE

## 2019-01-09 LAB — ACID FAST SMEAR (AFB, MYCOBACTERIA)
Acid Fast Smear: NEGATIVE
Acid Fast Smear: NEGATIVE

## 2019-01-09 LAB — CBC
HCT: 31.9 % — ABNORMAL LOW (ref 39.0–52.0)
Hemoglobin: 10.2 g/dL — ABNORMAL LOW (ref 13.0–17.0)
MCH: 28.7 pg (ref 26.0–34.0)
MCHC: 32 g/dL (ref 30.0–36.0)
MCV: 89.9 fL (ref 80.0–100.0)
Platelets: 404 10*3/uL — ABNORMAL HIGH (ref 150–400)
RBC: 3.55 MIL/uL — ABNORMAL LOW (ref 4.22–5.81)
RDW: 12.5 % (ref 11.5–15.5)
WBC: 9.1 10*3/uL (ref 4.0–10.5)
nRBC: 0 % (ref 0.0–0.2)

## 2019-01-09 LAB — GLUCOSE, BODY FLUID OTHER: Glucose, Body Fluid Other: 3 mg/dL

## 2019-01-09 MED ORDER — SENNOSIDES-DOCUSATE SODIUM 8.6-50 MG PO TABS
1.0000 | ORAL_TABLET | Freq: Every day | ORAL | Status: DC
  Administered 2019-01-10 – 2019-01-30 (×19): 1 via ORAL
  Filled 2019-01-09 (×21): qty 1

## 2019-01-09 NOTE — Evaluation (Signed)
Physical Therapy Evaluation Patient Details Name: James Beard. MRN: [id number] DOB: 03-14-1986 Today's Date: 01/09/2019   History of Present Illness  Pt is a 33 y/o male admitted secondary to worsening R knee pain. Found to have septic arthritis in R knee and septic tenosynovitis in L ankle. Pt is s/p I and D of R knee and L ankle.   Clinical Impression  Pt admitted secondary to problem above with deficits below. Gait distance limited secondary to pain and pt with very limited weight shift on LLE. Pt requiring min to min guard A for mobility tasks. Anticipate pt will progress well once pain controlled. Feel he would benefit from follow up PT, however, unsure of what he will be able to receive given he is homeless. Will likely need resources for shelters at d/c. Will continue to follow acutely to maximize functional mobility independence and safety.    Follow Up Recommendations Other (comment);Home health PT(would benefit from follow up PT, however, pt homeless)    Equipment Recommendations  Rolling walker with 5" wheels    Recommendations for Other Services       Precautions / Restrictions Precautions Precautions: Fall Required Braces or Orthoses: Other Brace Other Brace: L CAM walker boot Restrictions Weight Bearing Restrictions: No Other Position/Activity Restrictions: L CAM walker boot to be on during ambulation       Mobility  Bed Mobility Overal bed mobility: Needs Assistance Bed Mobility: Supine to Sit     Supine to sit: Supervision     General bed mobility comments: Supervision for safety.   Transfers Overall transfer level: Needs assistance Equipment used: Rolling walker (2 wheeled) Transfers: Sit to/from Stand Sit to Stand: Min assist         General transfer comment: Min A for steadying assist. Cues for safe hand placement.   Ambulation/Gait Ambulation/Gait assistance: Min guard Gait Distance (Feet): 10 Feet Assistive device: Rolling walker (2  wheeled) Gait Pattern/deviations: Step-to pattern;Decreased weight shift to left;Antalgic Gait velocity: Decreased    General Gait Details: Pt performing more of a hop to gait pattern on RLE as pt reports LLE too painful to put weight on. Distance limited to chair secondary to pain. Cues for sequencing using RW.   Stairs            Wheelchair Mobility    Modified Rankin (Stroke Patients Only)       Balance Overall balance assessment: Needs assistance Sitting-balance support: No upper extremity supported;Feet supported Sitting balance-Leahy Scale: Good     Standing balance support: Bilateral upper extremity supported;During functional activity Standing balance-Leahy Scale: Poor Standing balance comment: Reliant on UE support                              Pertinent Vitals/Pain Pain Assessment: 0-10 Pain Score: 5  Pain Location: L ankle, R knee  Pain Descriptors / Indicators: Aching Pain Intervention(s): Limited activity within patient's tolerance;Monitored during session;Repositioned    Home Living Family/patient expects to be discharged to:: Shelter/Homeless Living Arrangements: Alone               Additional Comments: Pt homeless and reports he is not sure where he will go after he gets out.     Prior Function Level of Independence: Independent               Hand Dominance        Extremity/Trunk Assessment   Upper Extremity Assessment Upper Extremity  Assessment: Defer to OT evaluation    Lower Extremity Assessment Lower Extremity Assessment: RLE deficits/detail;LLE deficits/detail RLE Deficits / Details: Deficits consistent with post op pain and weakness. Limited ROM at knee secondary to pain.  LLE Deficits / Details: Deficits consistent with post op pain and weakness.     Cervical / Trunk Assessment Cervical / Trunk Assessment: Normal  Communication   Communication: No difficulties  Cognition Arousal/Alertness:  Awake/alert Behavior During Therapy: WFL for tasks assessed/performed Overall Cognitive Status: Within Functional Limits for tasks assessed                                        General Comments      Exercises     Assessment/Plan    PT Assessment Patient needs continued PT services  PT Problem List Decreased strength;Decreased balance;Decreased mobility;Decreased range of motion;Decreased activity tolerance;Decreased knowledge of use of DME;Decreased knowledge of precautions;Pain       PT Treatment Interventions Gait training;DME instruction;Functional mobility training;Therapeutic activities;Therapeutic exercise;Balance training;Patient/family education;Stair training    PT Goals (Current goals can be found in the Care Plan section)  Acute Rehab PT Goals Patient Stated Goal: to be able to walk further distance.  PT Goal Formulation: With patient Time For Goal Achievement: 01/23/19 Potential to Achieve Goals: Good    Frequency Min 5X/week   Barriers to discharge Inaccessible home environment;Decreased caregiver support Pt's homeless     Co-evaluation               AM-PAC PT "6 Clicks" Mobility  Outcome Measure Help needed turning from your back to your side while in a flat bed without using bedrails?: None Help needed moving from lying on your back to sitting on the side of a flat bed without using bedrails?: A Little Help needed moving to and from a bed to a chair (including a wheelchair)?: A Little Help needed standing up from a chair using your arms (e.g., wheelchair or bedside chair)?: A Little Help needed to walk in hospital room?: A Little Help needed climbing 3-5 steps with a railing? : Total 6 Click Score: 17    End of Session Equipment Utilized During Treatment: Gait belt Activity Tolerance: Patient limited by pain Patient left: in chair;with call bell/phone within reach Nurse Communication: Mobility status PT Visit Diagnosis: Other  abnormalities of gait and mobility (R26.89);Pain Pain - part of body: Knee;Ankle and joints of foot(L ankle, R knee )    Time: 1010-1030 PT Time Calculation (min) (ACUTE ONLY): 20 min   Charges:   PT Evaluation $PT Eval Low Complexity: 1 Low          James Beard, PT, DPT  Acute Rehabilitation Services  Pager: [phone] Office: [phone]   Lehman PromBrittany S Jaiceon Collister 01/09/2019, 11:18 AM

## 2019-01-09 NOTE — Progress Notes (Signed)
   01/09/19 1400  Clinical Encounter Type  Visited With Patient  Visit Type Initial;Spiritual support  Referral From Nurse  Consult/Referral To Chaplain  Spiritual Encounters  Spiritual Needs Emotional;Prayer;Literature  Stress Factors  Patient Stress Factors Health changes;Major life changes   Responded to spiritual care consult. Pt was alert and sitting up in his chair beside bed. Pt was open for conversation. He stated that he was tired of his lifestyle and really wants to change his habits. He also stated that he inquired about a social work consult for resources that  would help with his addiction. His concern for going back to his old ways was that he did not have a support group. He said "I don't want this nomore!"  He requested a Bible and I gave him one. He stated that he was told that he might stay at hospital for a while and was looking forward to getting clean.  I offered spiritual care with words of comfort, empathic listening, ministry of presence and prayer. PT was thankful for the Chaplain Visit.  Chaplain Orest Dikes [phone]

## 2019-01-09 NOTE — Evaluation (Signed)
Occupational Therapy Evaluation Patient Details Name: James BromeCameron R Myint Jr. MRN: [id number] DOB: 01-12-1986 Today's Date: 01/09/2019    History of Present Illness Pt is a [age] y/o male admitted secondary to worsening R knee pain. Found to have septic arthritis in R knee and septic tenosynovitis in L ankle. Pt is s/p I and D of R knee and L ankle.    Clinical Impression   Pt PTA: Pt performing ADLs minimally and ambulating with large backpack as pt homeless. Pt performing mobility with no AD, but increased pain. Pt currently, performing ADL functional mobility with heavily reliance on RW for stability. LLE ankle wrapped and pt able to don CAM boot by leaning forward. Pt performing UB ADL with set-upA and minA for LB ADL. Pt minguardA to minA for transfers Pt would benefit from continued OT skilled services for ADL, mobility and safety in setting to be determined. OT to follow acutely.      Follow Up Recommendations  Other (comment)(to be determined; pt with no payer source and homeless.)    Equipment Recommendations  None recommended by OT    Recommendations for Other Services       Precautions / Restrictions Precautions Precautions: Fall Required Braces or Orthoses: Other Brace Other Brace: L CAM walker boot Restrictions Weight Bearing Restrictions: No Other Position/Activity Restrictions: L CAM walker boot to be on during ambulation       Mobility Bed Mobility Overal bed mobility: Needs Assistance Bed Mobility: Supine to Sit     Supine to sit: Supervision     General bed mobility comments: seated in recliner upon arrival  Transfers Overall transfer level: Needs assistance Equipment used: Rolling walker (2 wheeled) Transfers: Sit to/from Stand Sit to Stand: Min assist         General transfer comment: Min A for steadying assist. Cues for safe hand placement.     Balance Overall balance assessment: Needs assistance Sitting-balance support: No upper extremity  supported;Feet supported Sitting balance-Leahy Scale: Good     Standing balance support: Bilateral upper extremity supported;During functional activity Standing balance-Leahy Scale: Poor Standing balance comment: Reliant on UE support; very little weight through RLE due to knee pain                           ADL either performed or assessed with clinical judgement   ADL Overall ADL's : Needs assistance/impaired Eating/Feeding: Modified independent;Sitting   Grooming: Modified independent;Sitting   Upper Body Bathing: Set up;Sitting   Lower Body Bathing: Minimal assistance;Sitting/lateral leans;Sit to/from stand   Upper Body Dressing : Set up;Sitting   Lower Body Dressing: Minimal assistance;Cueing for safety;Cueing for sequencing;Sitting/lateral leans;Sit to/from stand   Toilet Transfer: Min guard;Comfort height toilet;Grab bars   Toileting- ArchitectClothing Manipulation and Hygiene: Minimal assistance;Sitting/lateral lean;Sit to/from stand;Cueing for safety       Functional mobility during ADLs: Min guard;Rolling walker General ADL Comments: set-upA for ADL and LB minA sitting and standing.     Vision Baseline Vision/History: Wears glasses Wears Glasses: Reading only Vision Assessment?: No apparent visual deficits     Perception     Praxis      Pertinent Vitals/Pain Pain Assessment: 0-10 Pain Score: 5  Pain Location: L ankle, R knee  Pain Descriptors / Indicators: Aching Pain Intervention(s): Limited activity within patient's tolerance;Repositioned;Patient requesting pain meds-RN notified     Hand Dominance     Extremity/Trunk Assessment Upper Extremity Assessment Upper Extremity Assessment: Generalized weakness   Lower  Extremity Assessment Lower Extremity Assessment: RLE deficits/detail;LLE deficits/detail RLE Deficits / Details: Deficits consistent with post op pain and weakness. Limited ROM at knee secondary to pain.  LLE Deficits / Details: Deficits  consistent with post op pain and weakness.    Cervical / Trunk Assessment Cervical / Trunk Assessment: Normal   Communication Communication Communication: No difficulties   Cognition Arousal/Alertness: Awake/alert Behavior During Therapy: WFL for tasks assessed/performed Overall Cognitive Status: Within Functional Limits for tasks assessed                                     General Comments       Exercises     Shoulder Instructions      Home Living Family/patient expects to be discharged to:: Shelter/Homeless Living Arrangements: Alone                               Additional Comments: Pt homeless and reports he is not sure where he will go after he gets out. Pt is hoping for a shelter to take him in       Prior Functioning/Environment Level of Independence: Independent                 OT Problem List: Decreased strength;Decreased activity tolerance;Impaired balance (sitting and/or standing);Decreased safety awareness;Decreased coordination;Decreased knowledge of use of DME or AE;Pain      OT Treatment/Interventions: Self-care/ADL training;Therapeutic exercise;Neuromuscular education;Energy conservation;DME and/or AE instruction;Therapeutic activities;Patient/family education;Balance training    OT Goals(Current goals can be found in the care plan section) Acute Rehab OT Goals Patient Stated Goal: to be able to walk further distance.  OT Goal Formulation: With patient Time For Goal Achievement: 01/23/19 Potential to Achieve Goals: Good ADL Goals Pt Will Perform Grooming: with modified independence;standing Pt Will Perform Lower Body Dressing: with modified independence;sit to/from stand Pt Will Perform Toileting - Clothing Manipulation and hygiene: with modified independence;sit to/from stand Additional ADL Goal #1: Pt will perform ADL in standing with fair balance x5 mins with supervisionA Additional ADL Goal #2: Pt will transfer  from various surfaces with good safety awareness with modified independence.  OT Frequency: Min 2X/week   Barriers to D/C: Inaccessible home environment;Decreased caregiver support  homeless.       Co-evaluation              AM-PAC OT "6 Clicks" Daily Activity     Outcome Measure Help from another person eating meals?: None Help from another person taking care of personal grooming?: None Help from another person toileting, which includes using toliet, bedpan, or urinal?: A Little Help from another person bathing (including washing, rinsing, drying)?: A Little Help from another person to put on and taking off regular upper body clothing?: None Help from another person to put on and taking off regular lower body clothing?: A Little 6 Click Score: 21   End of Session Equipment Utilized During Treatment: Gait belt;Rolling walker Nurse Communication: Mobility status;Patient requests pain meds  Activity Tolerance: Patient limited by pain;Patient tolerated treatment well Patient left: in chair;with call bell/phone within reach  OT Visit Diagnosis: Unsteadiness on feet (R26.81);Muscle weakness (generalized) (M62.81)                Time: 1150-1230 OT Time Calculation (min): 40 min Charges:  OT General Charges $OT Visit: 1 Visit OT Evaluation $OT Eval Moderate Complexity: 1  Mod OT Treatments $Self Care/Home Management : 23-37 mins  James Beard) James Beard OTR/L Acute Rehabilitation Services Pager: [phone] Office: [phone]   James Beard 01/09/2019, 12:50 PM

## 2019-01-09 NOTE — Progress Notes (Signed)
Subjective: 1 Day Post-Op Procedure(s) (LRB): IRRIGATION AND DEBRIDEMENT OF ABSCESS RIGHT KNEE AND LEFT ANKLE (Bilateral)  Patient denies significant amount of pain.  He has not been up mobilizing much.  They have been emptying the drains.  Denies any fevers or chills.  No complaints today.  Objective: Vital signs in last 24 hours: Temp:  [97.7 F (36.5 C)-98.6 F (37 C)] 98.6 F (37 C) (05/06 0500) Pulse Rate:  [51-71] 55 (05/06 0500) Resp:  [5-16] 16 (05/06 0500) BP: (103-125)/(56-77) 125/60 (05/06 0500) SpO2:  [94 %-100 %] 98 % (05/05 2149) Weight:  [65 kg] 65 kg (05/06 0500)  Intake/Output from previous day: 05/05 0701 - 05/06 0700 In: 209.1 [IV Piggyback:209.1] Out: 620 [Urine:575; Drains:35; Blood:10] Intake/Output this shift: No intake/output data recorded.  Recent Labs    01/07/19 2017 01/08/19 0314 01/09/19 0221  HGB 10.4* 9.8* 10.2*   Recent Labs    01/08/19 0314 01/09/19 0221  WBC 8.6 9.1  RBC 3.42* 3.55*  HCT 30.8* 31.9*  PLT 292 404*   Recent Labs    01/08/19 0314 01/09/19 0221  NA 137 143  K 3.7 4.2  CL 100 103  CO2 27 29  BUN 8 14  CREATININE 0.62 0.59*  GLUCOSE 107* 146*  CALCIUM 8.3* 8.5*   Patient is resting comfortably in no acute distress Awake and alert Right knee dressing intact.  Drain has recently been emptied and is empty currently. Left ankle dressing intact.  Drain again without significant drainage.  Patient is able to dorsiflex plantarflex bilateral ankles.  Endorses baseline sensation on the dorsal plantar foot.  Feet are warm and well-perfused.   Assessment/Plan: 1 Day Post-Op Procedure(s) (LRB): IRRIGATION AND DEBRIDEMENT OF ABSCESS RIGHT KNEE AND LEFT ANKLE (Bilateral)  Patient is postoperative day 1.  We sent intraoperative cultures.  His blood culture grew MSSA and his antibiotics have been adjusted by pharmacy.  He is pending hepatitis B, hepatitis C and HIV labs.  I will order a walking boot for his left ankle for  mobilizing.  He may get up out of bed as tolerated.  I instructed him to wear the boot when ambulating.  We will likely discharge the drains tomorrow if there output continues to be low.  Ultimately post discharge she will follow-up with me 2 weeks after surgery for suture removal and wound check.   Terance Hart 01/09/2019, 7:51 AM
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
## 2019-01-09 NOTE — Progress Notes (Signed)
Orthopedic Tech Progress Note Patient Details:  Zanden Burgers [birth year] [id number]  Ortho Devices Type of Ortho Device: CAM walker Ortho Device/Splint Location: LLE Ortho Device/Splint Interventions: Adjustment, Application, Ordered   Post Interventions Patient Tolerated: Well Instructions Provided: Care of device, Adjustment of device   Donald Pore 01/09/2019, 8:52 AM

## 2019-01-10 DIAGNOSIS — R7881 Bacteremia: Secondary | ICD-10-CM

## 2019-01-10 DIAGNOSIS — Z590 Homelessness: Secondary | ICD-10-CM

## 2019-01-10 DIAGNOSIS — E46 Unspecified protein-calorie malnutrition: Secondary | ICD-10-CM

## 2019-01-10 DIAGNOSIS — L02416 Cutaneous abscess of left lower limb: Secondary | ICD-10-CM

## 2019-01-10 DIAGNOSIS — M0009 Staphylococcal polyarthritis: Secondary | ICD-10-CM

## 2019-01-10 DIAGNOSIS — F1190 Opioid use, unspecified, uncomplicated: Secondary | ICD-10-CM

## 2019-01-10 DIAGNOSIS — B9561 Methicillin susceptible Staphylococcus aureus infection as the cause of diseases classified elsewhere: Secondary | ICD-10-CM

## 2019-01-10 DIAGNOSIS — R768 Other specified abnormal immunological findings in serum: Secondary | ICD-10-CM

## 2019-01-10 LAB — BODY FLUID CULTURE

## 2019-01-10 LAB — BASIC METABOLIC PANEL
Anion gap: 8 (ref 5–15)
BUN: 11 mg/dL (ref 6–20)
CO2: 31 mmol/L (ref 22–32)
Calcium: 8.4 mg/dL — ABNORMAL LOW (ref 8.9–10.3)
Chloride: 104 mmol/L (ref 98–111)
Creatinine, Ser: 0.55 mg/dL — ABNORMAL LOW (ref 0.61–1.24)
GFR calc Af Amer: >60 mL/min (ref >60)
GFR calc non Af Amer: >60 mL/min (ref >60)
Glucose, Bld: 119 mg/dL — ABNORMAL HIGH (ref 70–99)
Potassium: 3.9 mmol/L (ref 3.5–5.1)
Sodium: 143 mmol/L (ref 135–145)

## 2019-01-10 LAB — CBC
HCT: 30.4 % — ABNORMAL LOW (ref 39.0–52.0)
Hemoglobin: 9.8 g/dL — ABNORMAL LOW (ref 13.0–17.0)
MCH: 29.3 pg (ref 26.0–34.0)
MCHC: 32.2 g/dL (ref 30.0–36.0)
MCV: 91 fL (ref 80.0–100.0)
Platelets: 323 10*3/uL (ref 150–400)
RBC: 3.34 MIL/uL — ABNORMAL LOW (ref 4.22–5.81)
RDW: 12.8 % (ref 11.5–15.5)
WBC: 6.8 10*3/uL (ref 4.0–10.5)
nRBC: 0 % (ref 0.0–0.2)

## 2019-01-10 LAB — CULTURE, BLOOD (ROUTINE X 2)

## 2019-01-10 LAB — AEROBIC CULTURE W GRAM STAIN (SUPERFICIAL SPECIMEN): Culture: NO GROWTH

## 2019-01-10 LAB — HEPATITIS B SURFACE ANTIBODY,QUALITATIVE: Hep B S Ab: NONREACTIVE

## 2019-01-10 LAB — HEPATITIS B SURFACE ANTIGEN: Hepatitis B Surface Ag: NEGATIVE

## 2019-01-10 LAB — HEPATITIS B CORE ANTIBODY, TOTAL: Hep B Core Total Ab: NEGATIVE

## 2019-01-10 MED ORDER — KETOROLAC TROMETHAMINE 15 MG/ML IJ SOLN
15.0000 mg | Freq: Four times a day (QID) | INTRAMUSCULAR | Status: AC | PRN
  Administered 2019-01-10 – 2019-01-12 (×5): 15 mg via INTRAVENOUS
  Filled 2019-01-10 (×5): qty 1

## 2019-01-10 NOTE — Progress Notes (Signed)
Subjective: 2 Days Post-Op Procedure(s) (LRB): IRRIGATION AND DEBRIDEMENT OF ABSCESS RIGHT KNEE AND LEFT ANKLE (Bilateral)  Patient doing well this morning.  He reports some pain in the right knee and left ankle that is not worsened since surgery.  He has been out of bed some.  Orthotec tried a cam walker boot yesterday.  Denies any fevers or chills.  Is resting well.  Objective: Vital signs in last 24 hours: Temp:  [98.6 F (37 C)-98.9 F (37.2 C)] 98.6 F (37 C) (05/07 0529) Pulse Rate:  [55-63] 55 (05/07 0529) Resp:  [16-21] 16 (05/07 0529) BP: (120-135)/(74-78) 133/74 (05/07 0529) SpO2:  [97 %-100 %] 100 % (05/07 0529) Weight:  [68.5 kg] 68.5 kg (05/07 0529)  Intake/Output from previous day: 05/06 0701 - 05/07 0700 In: 1260 [P.O.:1060; IV Piggyback:200] Out: 350 [Urine:350] Intake/Output this shift: No intake/output data recorded.  Recent Labs    01/07/19 2017 01/08/19 0314 01/09/19 0221 01/10/19 0218  HGB 10.4* 9.8* 10.2* 9.8*   Recent Labs    01/09/19 0221 01/10/19 0218  WBC 9.1 6.8  RBC 3.55* 3.34*  HCT 31.9* 30.4*  PLT 404* 323   Recent Labs    01/09/19 0221 01/10/19 0218  NA 143 143  K 4.2 3.9  CL 103 104  CO2 29 31  BUN 14 11  CREATININE 0.59* 0.55*  GLUCOSE 146* 119*  CALCIUM 8.5* 8.4*   Awake and alert  Right knee with drain in place.  No drainage within the drain.  Patient is able to actively extend and flex the knee albeit with some pain.  Sensation intact on the dorsal plantar foot.  Foot is warm well perfused  Left ankle with drain in place.  No drainage within the drain.  Patient is able to actively dorsiflex and plantarflex the ankle with some discomfort laterally.  Sensation intact on dorsal plantar foot.  Foot is warm well perfused.   Assessment/Plan: 2 Days Post-Op Procedure(s) (LRB): IRRIGATION AND DEBRIDEMENT OF ABSCESS RIGHT KNEE AND LEFT ANKLE (Bilateral)  Patient appears to be doing well after I&D of his right knee and left  ankle abscess and peroneal tendons.  I remove the drains this morning.  He will wear the walking boot on the left ankle when up and out of bed.  He does have evidence of paraspinal abscess without any surgical recommendations from neurosurgery.  I do agree that considering IR drainage of his paraspinal abscess is reasonable.  TTE was negative for vegetations.  TEE may be reasonable given some thickening of the valves.  From an orthopedic standpoint no further surgery required.  We will continue to monitor pain level.  I encouraged him to work on knee range of motion.  We will continue to follow while admitted.  He will follow-up with me in 2 weeks for wound check and suture removal.  Terance Hart 01/10/2019, 8:53 AM

## 2019-01-10 NOTE — Anesthesia Postprocedure Evaluation (Signed)
Anesthesia Post Note  Patient: James Beard.  Procedure(s) Performed: IRRIGATION AND DEBRIDEMENT OF ABSCESS RIGHT KNEE AND LEFT ANKLE (Bilateral )     Patient location during evaluation: PACU Anesthesia Type: General Level of consciousness: awake and alert Pain management: pain level controlled Vital Signs Assessment: post-procedure vital signs reviewed and stable Respiratory status: spontaneous breathing, nonlabored ventilation, respiratory function stable and patient connected to nasal cannula oxygen Cardiovascular status: blood pressure returned to baseline and stable Postop Assessment: no apparent nausea or vomiting Anesthetic complications: no    Last Vitals:  Vitals:   01/10/19 0529 01/10/19 1341  BP: 133/74 (!) 142/90  Pulse: (!) 55 64  Resp: 16   Temp: 37 C 36.9 C  SpO2: 100% 98%    Last Pain:  Vitals:   01/10/19 1341  TempSrc: Oral  PainSc:                  Jyla Hopf

## 2019-01-10 NOTE — Progress Notes (Addendum)
   Subjective: No overnight events. James Beard reports that his pain his tolerable this morning. He has a good appetite and is eating a lot to try to improve his nutrition. His surgical drains were removed this am. All questions were answered.   Objective:  Vital signs in last 24 hours: Vitals:   01/09/19 0500 01/09/19 1330 01/09/19 2125 01/10/19 0529  BP: 125/60 135/78 120/74 133/74  Pulse: (!) 55 63 (!) 57 (!) 55  Resp: 16 (!) _0 Temp: 98.6 F (37 C) 98.9 F (37.2 C) 98.6 F (37 C) 98.6 F (37 C)  TempSrc: Oral Oral Oral Oral  SpO2:  97% 97% 100%  Weight: 65 kg     Height:       Gen: alert and oriented x3, no distress CV: RRR, systolic murmur Pulm: CTAB, normal effort on room air Abd: BS+, soft, non-distended, non-tender Ext: right 4th digit deformity from prior untreated fx, no joint effusion, erythema, or increased warmth; left ankle and right knee have clean bandages in place, drains have been removed; no edema; both lower extremities are neurovascularly intact  Assessment/Plan:  Principal Problem:   Arthritis, septic, knee (HCC) Active Problems:   Opioid use disorder (HCC)   Major depressive disorder, recurrent severe without psychotic features (HCC)   Tenosynovitis of left ankle   Paraspinal abscess [facility])  James Beard an unhomed63 year old male with bipolar disorder, and MDD, and substance use disorder who presentedwith right knee and left ankle pain and swelling as well as lumbar spine pain in the setting of regular IV drug use.   MSSA bacteremia Right knee septic arthritis Left ankle septic tenosynovitis T10-T12 right paraspinal abscess Afebrile without leukocytosis. Underwent right knee and left ankle washout with ortho 5/5. Neurosurgery does not recommend intervention for paraspinal abscess. IR only drains lumbar abscesses. ID agrees with medical management at this time. Negative TTE, no indication for TEE.  - ID following, appreciate recs  -  Ortho following -Ancef (day 3) - F/u repeat blood cx from 5/6 -Pain control: scheduled toradol q6hrs and percocet 7.5-325 mg q6hrsprn - Continue working with PT/OT  Inflammatory arthritis: Thoracic/lumbar MRI showed inflammatory arthritic changes of the costovertebral jointsthroughout the thoracic spineand bilateral sacoiliitis with fusion of the SI joints consistent with inflammatory arthritis. May be traumatic arthritis or ankylosing spondylitis. - F/u HLA-B27   Substance use disorder: Regularly uses heroin, crystal meth, and crack cocaine. Also gets daily methadone with [facility].Spoke with [facility] nurse to inform them of his hospitalization. HIV NR. HBsAg, HbsAg-Ab, and HBcAb negative.  -Continue methadone36m daily - SW consulted for housing situation and substance use disorder - Bowel regimen to prevent constipation  Hepatitis C positive antibody: HCV antibody positive three months ago. Patient unaware of Hep C hx.  - F/u HCV RNA quant  Bipolar disorder/major depressive disorder:Stopped takingolanzapine and sertraline2 months ago as he did not pick up his medication. Resumed on admission. - Continueolanzapine 2.5 mg daily andsertraline 100 mg daily  Malnutrition Will monitor electrolytes for refeeding syndrome as patient has hx of recent weight loss and poor PO intake. - am BMP, Mag, Phos - Ensure supplement  Dispo:Patient will require prolonged inpatient IV abx therapy  James Beard, DAndree Elk MD 01/10/2019, 6:25 AM Pager: [phone]

## 2019-01-10 NOTE — Progress Notes (Signed)
[facility]  Date of Admission:  01/07/2019     Total days of antibiotics 4         ASSESSMENT/PLAN  James Beard is a 33 year old male with history of IV drug use and homelessness admitted with right knee and left foot pain and found to have disseminated MSSA infection with septic arthritis of the right knee and large abscess of the left ankle status post incision and drainage of both areas and bacteremia.  MRI findings of paraspinal abscess also present.  Blood cultures positive for MSSA bacteremia with TTE showing no evidence of endocarditis and preserved heart and valve function.  MSSA bacteremia - repeat blood cultures with no growth in less than 24 hours.  Afebrile.  Continue to hold PICC line while awaiting blood culture clearance. Continue current dose of cefazolin.  Disseminated MSSA infection of multiple joints -postoperative day 2 from incision and drainage of the right knee and left foot.  Pain adequately controlled.  Synovial aspiration cultures positive for MSSA.  No surgical intervention for the back abscess at this time.  Continue wound care per orthopedics.  Opioid use disorder -stable with current dose of methadone managed by primary team.  Wishing to continue sobriety and become clean during hospitalization.  Continue methadone and treatment per primary team.  Hepatitis C positive antibody -hepatitis C RNA levels pending.  Not immune to hepatitis B and no acute infection.  If hepatitis C viral levels are present consider outpatient treatment.   -Principal Problem:   Arthritis, septic, knee (HCC) Active Problems:   Opioid use disorder (HCC)   Major depressive disorder, recurrent severe without psychotic features (HCC)   Tenosynovitis of left ankle   Paraspinal abscess (HCC)   . enoxaparin (LOVENOX) injection  40 mg Subcutaneous Q24H  . feeding supplement (ENSURE ENLIVE)  237 mL Oral TID BM  . feeding supplement (PRO-STAT SUGAR FREE 64)   30 mL Oral BID  . ketorolac  15 mg Intravenous Q6H  . methadone  70 mg Oral Daily  . multivitamin with minerals  1 tablet Oral Daily  . nicotine  21 mg Transdermal Daily  . OLANZapine  2.5 mg Oral QHS  . senna-docusate  1 tablet Oral QHS  . sertraline  50 mg Oral Daily    SUBJECTIVE:  Afebrile in last 48 hours with no leukocytosis.  No acute events/complaints overnight.  Repeat blood cultures with no growth in less than 24 hours.  Having some pain in his surgical sites.  Drains removed.  No Known Allergies   Review of Systems: Review of Systems  Constitutional: Negative for chills, fever and weight loss.  Respiratory: Negative for cough, shortness of breath and wheezing.   Cardiovascular: Negative for chest pain and leg swelling.  Gastrointestinal: Negative for abdominal pain, constipation, diarrhea, nausea and vomiting.  Skin: Negative for rash.      OBJECTIVE: Vitals:   01/09/19 0500 01/09/19 1330 01/09/19 2125 01/10/19 0529  BP: 125/60 135/78 120/74 133/74  Pulse: (!) 55 63 (!) 57 (!) 55  Resp: 16 (!) Temp: 98.6 F (37 C) 98.9 F (37.2 C) 98.6 F (37 C) 98.6 F (37 C)  TempSrc: Oral Oral Oral Oral  SpO2:  97% 97% 100%  Weight: 65 kg   68.5 kg  Height:       Body mass index is 22.29 kg/m.  Physical Exam Constitutional:      General: He is not in acute distress.  Appearance: He is well-developed.     Comments: Lying in bed with head of bed elevated; pleasant  Cardiovascular:     Rate and Rhythm: Normal rate and regular rhythm.     Heart sounds: Normal heart sounds.  Pulmonary:     Effort: Pulmonary effort is normal.     Breath sounds: Normal breath sounds.  Musculoskeletal:     Comments: Surgical dressings of the right knee and left foot are clean and dry.  Skin:    General: Skin is warm and dry.  Neurological:     Mental Status: He is alert and oriented to person, place, and time.  Psychiatric:        Mood and Affect: Mood normal.      Lab Results Lab Results  Component Value Date   WBC 6.8 01/10/2019   HGB 9.8 (L) 01/10/2019   HCT 30.4 (L) 01/10/2019   MCV 91.0 01/10/2019   PLT 323 01/10/2019    Lab Results  Component Value Date   CREATININE 0.55 (L) 01/10/2019   BUN 11 01/10/2019   NA 143 01/10/2019   K 3.9 01/10/2019   CL 104 01/10/2019   CO2 31 01/10/2019    Lab Results  Component Value Date   ALT 35 01/07/2019   AST 22 01/07/2019   ALKPHOS 196 (H) 01/07/2019   BILITOT 0.6 01/07/2019     Microbiology: Recent Results (from the past 240 hour(s))  Surgical pcr screen     Status: Abnormal   Collection Time: 01/07/19 12:13 AM  Result Value Ref Range Status   MRSA, PCR NEGATIVE NEGATIVE Final   Staphylococcus aureus POSITIVE (A) NEGATIVE Final    Comment: (NOTE) The Xpert SA Assay (FDA approved for NASAL specimens in patients 35 years of age and older), is one component of a comprehensive surveillance program. It is not intended to diagnose infection nor to guide or monitor treatment. Performed at [facility], [address] [place], [place] [address]   Body fluid culture     Status: None   Collection Time: 01/07/19  2:28 PM  Result Value Ref Range Status   Specimen Description FLUID RIGHT KNEE  Final   Special Requests LOOK FOR GC PER MD  Final   Gram Stain   Final    ABUNDANT WBC PRESENT, PREDOMINANTLY PMN NO ORGANISMS SEEN    Culture   Final    RARE STAPHYLOCOCCUS AUREUS CRITICAL RESULT CALLED TO, READ BACK BY AND VERIFIED WITH: RN Freeman CaldronM FERGUSON [id number] AT 1003 BY CM Performed at [facility], [address] [place], [place] [address]    Report Status 01/10/2019 FINAL  Final   Organism ID, Bacteria STAPHYLOCOCCUS AUREUS  Final      Susceptibility   Staphylococcus aureus - MIC*    CIPROFLOXACIN <=0.5 SENSITIVE Sensitive     ERYTHROMYCIN <=0.25 SENSITIVE Sensitive     GENTAMICIN <=0.5 SENSITIVE Sensitive     OXACILLIN 0.5 SENSITIVE Sensitive     TETRACYCLINE <=1 SENSITIVE  Sensitive     VANCOMYCIN 1 SENSITIVE Sensitive     TRIMETH/SULFA <=10 SENSITIVE Sensitive     CLINDAMYCIN <=0.25 SENSITIVE Sensitive     RIFAMPIN <=0.5 SENSITIVE Sensitive     Inducible Clindamycin NEGATIVE Sensitive     * RARE STAPHYLOCOCCUS AUREUS  SARS Coronavirus 2 (CEPHEID - Performed in [facility] hospital lab), Hosp Order     Status: None   Collection Time: 01/07/19  8:17 PM  Result Value Ref Range Status  SARS Coronavirus 2 NEGATIVE NEGATIVE Final    Comment: (NOTE) If result is NEGATIVE SARS-CoV-2 target nucleic acids are NOT DETECTED. The SARS-CoV-2 RNA is generally detectable in upper and lower  respiratory specimens during the acute phase of infection. The lowest  concentration of SARS-CoV-2 viral copies this assay can detect is 250  copies / mL. A negative result does not preclude SARS-CoV-2 infection  and should not be used as the sole basis for treatment or other  patient management decisions.  A negative result may occur with  improper specimen collection / handling, submission of specimen other  than nasopharyngeal swab, presence of viral mutation(s) within the  areas targeted by this assay, and inadequate number of viral copies  (<250 copies / mL). A negative result must be combined with clinical  observations, patient history, and epidemiological information. If result is POSITIVE SARS-CoV-2 target nucleic acids are DETECTED. The SARS-CoV-2 RNA is generally detectable in upper and lower  respiratory specimens dur ing the acute phase of infection.  Positive  results are indicative of active infection with SARS-CoV-2.  Clinical  correlation with patient history and other diagnostic information is  necessary to determine patient infection status.  Positive results do  not rule out bacterial infection or co-infection with other viruses. If result is PRESUMPTIVE POSTIVE SARS-CoV-2 nucleic acids MAY BE PRESENT.   A presumptive positive result was obtained on the  submitted specimen  and confirmed on repeat testing.  While 2019 novel coronavirus  (SARS-CoV-2) nucleic acids may be present in the submitted sample  additional confirmatory testing may be necessary for epidemiological  and / or clinical management purposes  to differentiate between  SARS-CoV-2 and other Sarbecovirus currently known to infect humans.  If clinically indicated additional testing with an alternate test  methodology [id number]) is advised. The SARS-CoV-2 RNA is generally  detectable in upper and lower respiratory sp ecimens during the acute  phase of infection. The expected result is Negative. Fact Sheet for Patients:  BoilerBrush.com.cy Fact Sheet for Healthcare Providers: https://pope.com/ This test is not yet approved or cleared by the [place] FDA and has been authorized for detection and/or diagnosis of SARS-CoV-2 by FDA under an Emergency Use Authorization (EUA).  This EUA will remain in effect (meaning this test can be used) for the duration of the COVID-19 declaration under Section 564(b)(1) of the Act, 21 U.S.C. section 360bbb-3(b)(1), unless the authorization is terminated or revoked sooner. Performed at [facility], [address], [place], [place] [address]   Blood culture (routine x 2)     Status: Abnormal   Collection Time: 01/07/19  8:17 PM  Result Value Ref Range Status   Specimen Description BLOOD RIGHT ANTECUBITAL  Final   Special Requests   Final    BOTTLES DRAWN AEROBIC AND ANAEROBIC Blood Culture results may not be optimal due to an excessive volume of blood received in culture bottles   Culture  Setup Time   Final    AEROBIC BOTTLE ONLY GRAM POSITIVE COCCI CRITICAL RESULT CALLED TO, READ BACK BY AND VERIFIED WITH: L[place]le Butte [facility] 01/08/19 1917 JDW Performed at [facility], [address] [place], [place] [address]    Culture STAPHYLOCOCCUS AUREUS (A)  Final   Report Status  01/10/2019 FINAL  Final   Organism ID, Bacteria STAPHYLOCOCCUS AUREUS  Final      Susceptibility   Staphylococcus aureus - MIC*    CIPROFLOXACIN <=0.5 SENSITIVE Sensitive     ERYTHROMYCIN <=0.25 SENSITIVE Sensitive  GENTAMICIN <=0.5 SENSITIVE Sensitive     OXACILLIN 0.5 SENSITIVE Sensitive     TETRACYCLINE <=1 SENSITIVE Sensitive     VANCOMYCIN 1 SENSITIVE Sensitive     TRIMETH/SULFA <=10 SENSITIVE Sensitive     CLINDAMYCIN <=0.25 SENSITIVE Sensitive     RIFAMPIN <=0.5 SENSITIVE Sensitive     Inducible Clindamycin NEGATIVE Sensitive     * STAPHYLOCOCCUS AUREUS  Blood Culture ID Panel (Reflexed)     Status: Abnormal   Collection Time: 01/07/19  8:17 PM  Result Value Ref Range Status   Enterococcus species NOT DETECTED NOT DETECTED Final   Listeria monocytogenes NOT DETECTED NOT DETECTED Final   Staphylococcus species DETECTED (A) NOT DETECTED Final    Comment: CRITICAL RESULT CALLED TO, READ BACK BY AND VERIFIED WITH: L[place]le Butte [facility] 01/08/19 1917 JDW    Staphylococcus aureus (BCID) DETECTED (A) NOT DETECTED Final    Comment: Methicillin (oxacillin) susceptible Staphylococcus aureus (MSSA). Preferred therapy is anti staphylococcal beta lactam antibiotic (Cefazolin or Nafcillin), unless clinically contraindicated. CRITICAL RESULT CALLED TO, READ BACK BY AND VERIFIED WITH: L[place]le Butte [facility] 01/08/19 1917 JDW    Methicillin resistance NOT DETECTED NOT DETECTED Final   Streptococcus species NOT DETECTED NOT DETECTED Final   Streptococcus agalactiae NOT DETECTED NOT DETECTED Final   Streptococcus pneumoniae NOT DETECTED NOT DETECTED Final   Streptococcus pyogenes NOT DETECTED NOT DETECTED Final   Acinetobacter baumannii NOT DETECTED NOT DETECTED Final   Enterobacteriaceae species NOT DETECTED NOT DETECTED Final   Enterobacter cloacae complex NOT DETECTED NOT DETECTED Final   Escherichia coli NOT DETECTED NOT DETECTED Final   Klebsiella oxytoca NOT DETECTED NOT DETECTED Final    Klebsiella pneumoniae NOT DETECTED NOT DETECTED Final   Proteus species NOT DETECTED NOT DETECTED Final   Serratia marcescens NOT DETECTED NOT DETECTED Final   Haemophilus influenzae NOT DETECTED NOT DETECTED Final   Neisseria meningitidis NOT DETECTED NOT DETECTED Final   Pseudomonas aeruginosa NOT DETECTED NOT DETECTED Final   Candida albicans NOT DETECTED NOT DETECTED Final   Candida glabrata NOT DETECTED NOT DETECTED Final   Candida krusei NOT DETECTED NOT DETECTED Final   Candida parapsilosis NOT DETECTED NOT DETECTED Final   Candida tropicalis NOT DETECTED NOT DETECTED Final    Comment: Performed at [facility], [address], [place], [place] [address]  Blood culture (routine x 2)     Status: None (Preliminary result)   Collection Time: 01/07/19  8:34 PM  Result Value Ref Range Status   Specimen Description BLOOD LEFT ANTECUBITAL  Final   Special Requests   Final    BOTTLES DRAWN AEROBIC AND ANAEROBIC Blood Culture results may not be optimal due to an excessive volume of blood received in culture bottles   Culture   Final    NO GROWTH 3 DAYS Performed at [facility], [address] [place], [place] [address]    Report Status PENDING  Incomplete  Aerobic Culture (superficial specimen)     Status: None (Preliminary result)   Collection Time: 01/08/19 11:40 AM  Result Value Ref Range Status   Specimen Description FLUID  Final   Special Requests B LEFT ANKLE  Final   Gram Stain   Final    RARE WBC PRESENT, PREDOMINANTLY PMN NO ORGANISMS SEEN    Culture   Final    NO GROWTH < 24 HOURS Performed at [facility], [address], [place], [place] [address]    Report Status PENDING  Incomplete  Acid Fast Smear (AFB)     Status: None   Collection Time: 01/08/19 11:40 AM  Result Value Ref Range Status   AFB Specimen Processing Comment  Final    Comment: Tissue Grinding and Digestion/Decontamination   Acid Fast Smear Negative  Final    Comment: (NOTE)  Performed At: [facility] [address] [place], [place] [id number] Jolene Schimke MD WU:[id number]    Source (AFB) FLUID  Final    Comment: Performed at [facility], [address] [place], [place] [address]  Anaerobic culture     Status: None (Preliminary result)   Collection Time: 01/08/19 11:45 AM  Result Value Ref Range Status   Specimen Description FLUID  Final   Special Requests   Final    A RIGHT KNEE Performed at [facility], [address] [place], [place] [address]    Gram Stain PENDING  Incomplete   Culture PENDING  Incomplete   Report Status PENDING  Incomplete  Aerobic Culture (superficial specimen)     Status: None (Preliminary result)   Collection Time: 01/08/19 11:45 AM  Result Value Ref Range Status   Specimen Description FLUID  Final   Special Requests A RIGHT KNEE  Final   Gram Stain   Final    MODERATE WBC PRESENT, PREDOMINANTLY PMN NO ORGANISMS SEEN    Culture   Final    NO GROWTH < 24 HOURS Performed at [facility], [address], [place], [place] [address]    Report Status PENDING  Incomplete  Acid Fast Smear (AFB)     Status: None   Collection Time: 01/08/19 11:45 AM  Result Value Ref Range Status   AFB Specimen Processing Concentration  Final   Acid Fast Smear Negative  Final    Comment: (NOTE) Performed At: [facility] [address] [place], [place] [id number] Jolene Schimke MD KG:[id number]    Source (AFB) FLUID  Final    Comment: Performed at [facility], [address] [place], [place] [address]  Culture, blood (routine x 2)     Status: None (Preliminary result)   Collection Time: 01/09/19 11:54 AM  Result Value Ref Range Status   Specimen Description BLOOD LEFT ANTECUBITAL  Final   Special Requests   Final    BOTTLES DRAWN AEROBIC ONLY Blood Culture adequate volume   Culture   Final    NO GROWTH < 24 HOURS Performed at [facility], [address], [place], [place] [address]     Report Status PENDING  Incomplete  Culture, blood (routine x 2)     Status: None (Preliminary result)   Collection Time: 01/09/19 11:54 AM  Result Value Ref Range Status   Specimen Description BLOOD LEFT ANTECUBITAL  Final   Special Requests   Final    BOTTLES DRAWN AEROBIC ONLY Blood Culture adequate volume   Culture   Final    NO GROWTH < 24 HOURS Performed at [facility], [address] [place], [place] [address]    Report Status PENDING  Incomplete     Marcos Eke, NP [facility] [phone] Pager  01/10/2019  9:47 AM

## 2019-01-10 NOTE — Progress Notes (Signed)
Physical Therapy Treatment Patient Details Name: James BromeCameron R Sumlin Jr. MRN: [id number] DOB: 17-Dec-1985 Today's Date: 01/10/2019    History of Present Illness Pt is a 33 y/o male admitted secondary to worsening R knee pain. Found to have septic arthritis in R knee and septic tenosynovitis in L ankle. Pt is s/p I and D of R knee and L ankle.     PT Comments    Pt progressing well towards goals. Improved tolerance for gait and noted increased ability to weight shift to LLE using CAM boot. Reviewed ROM exercises for R knee. Will continue to follow acutely to maximize functional mobility independence and safety.    Follow Up Recommendations  Other (comment)(would benefit from follow up PT, however, pt homeless)     Equipment Recommendations  Rolling walker with 5" wheels    Recommendations for Other Services       Precautions / Restrictions Precautions Precautions: Fall Required Braces or Orthoses: Other Brace Other Brace: L CAM walker boot Restrictions Weight Bearing Restrictions: No Other Position/Activity Restrictions: L CAM walker boot to be on during ambulation     Mobility  Bed Mobility Overal bed mobility: Modified Independent                Transfers Overall transfer level: Needs assistance Equipment used: Rolling walker (2 wheeled) Transfers: Sit to/from Stand Sit to Stand: Min assist         General transfer comment: Min A for steadying assist. Demonstrated safe hand placement.   Ambulation/Gait Ambulation/Gait assistance: Min guard Gait Distance (Feet): 50 Feet Assistive device: Rolling walker (2 wheeled) Gait Pattern/deviations: Step-through pattern;Decreased step length - right;Decreased step length - left;Antalgic Gait velocity: Decreased    General Gait Details: Slow, antalgic gait, however, pt with increased weightbearing to LLE in CAM boot this session. Cues for sequencing using RW.    Stairs             Wheelchair Mobility     Modified Rankin (Stroke Patients Only)       Balance Overall balance assessment: Needs assistance Sitting-balance support: No upper extremity supported;Feet supported Sitting balance-Leahy Scale: Good     Standing balance support: Bilateral upper extremity supported;During functional activity Standing balance-Leahy Scale: Poor Standing balance comment: Reliant on UE support; very little weight through RLE due to knee pain                            Cognition Arousal/Alertness: Awake/alert Behavior During Therapy: WFL for tasks assessed/performed Overall Cognitive Status: Within Functional Limits for tasks assessed                                        Exercises General Exercises - Lower Extremity Quad Sets: AROM;Right;10 reps;Supine Heel Slides: AROM;Right;10 reps;Supine    General Comments        Pertinent Vitals/Pain Pain Assessment: 0-10 Pain Score: 8  Pain Location: L ankle, R knee  Pain Descriptors / Indicators: Aching Pain Intervention(s): Limited activity within patient's tolerance;Monitored during session;Repositioned    Home Living                      Prior Function            PT Goals (current goals can now be found in the care plan section) Acute Rehab PT Goals Patient Stated Goal: "to  get clean" PT Goal Formulation: With patient Time For Goal Achievement: 01/23/19 Potential to Achieve Goals: Good Progress towards PT goals: Progressing toward goals    Frequency    Min 5X/week      PT Plan Current plan remains appropriate    Co-evaluation              AM-PAC PT "6 Clicks" Mobility   Outcome Measure  Help needed turning from your back to your side while in a flat bed without using bedrails?: None Help needed moving from lying on your back to sitting on the side of a flat bed without using bedrails?: None Help needed moving to and from a bed to a chair (including a wheelchair)?: A Little Help  needed standing up from a chair using your arms (e.g., wheelchair or bedside chair)?: A Little Help needed to walk in hospital room?: A Little Help needed climbing 3-5 steps with a railing? : A Lot 6 Click Score: 19    End of Session Equipment Utilized During Treatment: Gait belt Activity Tolerance: Patient limited by pain Patient left: in chair;with call bell/phone within reach Nurse Communication: Mobility status PT Visit Diagnosis: Other abnormalities of gait and mobility (R26.89);Pain Pain - part of body: Knee;Ankle and joints of foot(L ankle, R knee )     Time: 2376-2831 PT Time Calculation (min) (ACUTE ONLY): 15 min  Charges:  $Gait Training: 8-22 mins                     Gladys Damme, PT, DPT  Acute Rehabilitation Services  Pager: [phone] Office: [phone]    Lehman Prom 01/10/2019, 12:03 PM

## 2019-01-11 LAB — BASIC METABOLIC PANEL
Anion gap: 8 (ref 5–15)
BUN: 13 mg/dL (ref 6–20)
CO2: 33 mmol/L — ABNORMAL HIGH (ref 22–32)
Calcium: 8.3 mg/dL — ABNORMAL LOW (ref 8.9–10.3)
Chloride: 99 mmol/L (ref 98–111)
Creatinine, Ser: 0.66 mg/dL (ref 0.61–1.24)
GFR calc Af Amer: >60 mL/min (ref >60)
GFR calc non Af Amer: >60 mL/min (ref >60)
Glucose, Bld: 113 mg/dL — ABNORMAL HIGH (ref 70–99)
Potassium: 4 mmol/L (ref 3.5–5.1)
Sodium: 140 mmol/L (ref 135–145)

## 2019-01-11 LAB — MAGNESIUM: Magnesium: 1.8 mg/dL (ref 1.7–2.4)

## 2019-01-11 LAB — PHOSPHORUS: Phosphorus: 5.2 mg/dL — ABNORMAL HIGH (ref 2.5–4.6)

## 2019-01-11 MED ORDER — SODIUM CHLORIDE 0.9 % IV SOLN
INTRAVENOUS | Status: DC | PRN
  Administered 2019-01-11 – 2019-01-12 (×2): 250 mL via INTRAVENOUS

## 2019-01-11 MED ORDER — HEPATITIS B VAC RECOMBINANT 10 MCG/ML IJ SUSP
1.0000 mL | Freq: Once | INTRAMUSCULAR | Status: AC
  Administered 2019-01-11: 13:00:00 10 ug via INTRAMUSCULAR
  Filled 2019-01-11: qty 1

## 2019-01-11 NOTE — Progress Notes (Signed)
Occupational Therapy Treatment Patient Details Name: James Beard. MRN: [id number] DOB: 1985-12-21 Today's Date: 01/11/2019    History of present illness Pt is a 33 y/o male admitted secondary to worsening R knee pain. Found to have septic arthritis in R knee and septic tenosynovitis in L ankle. Pt is s/p I and D of R knee and L ankle.    OT comments  Pt performing ADL functional transfers and ADL functional mobility with RW with supervisionA in room. Pt with increased confidence in standing with CAM boot on LLE. Pt performing standing at sink for ADL tasks and able to simulate pulling pants to waist in standing with supervisionA.  UB ADL with modified independence. Pt stood at sink x15 mins for shaving task. Pt has increased to new baseline for ADL and functional transfers in environment. Pt meeting goals. OT signing off.    Follow Up Recommendations  No OT follow up    Equipment Recommendations  None recommended by OT    Recommendations for Other Services      Precautions / Restrictions Precautions Precautions: Fall Required Braces or Orthoses: Other Brace Other Brace: L CAM walker boot Restrictions Weight Bearing Restrictions: No Other Position/Activity Restrictions: L CAM walker boot to be on during ambulation        Mobility Bed Mobility Overal bed mobility: Modified Independent             General bed mobility comments: seated in recliner upon arrival  Transfers Overall transfer level: Needs assistance Equipment used: Rolling walker (2 wheeled) Transfers: Sit to/from Stand Sit to Stand: Supervision         General transfer comment: Supervision for safety. Cues for hand placement.    Balance Overall balance assessment: Needs assistance Sitting-balance support: No upper extremity supported;Feet supported Sitting balance-Leahy Scale: Good     Standing balance support: Bilateral upper extremity supported;During functional activity Standing  balance-Leahy Scale: Fair Standing balance comment: Reliant on UE support; very little weight through RLE due to knee pain                           ADL either performed or assessed with clinical judgement   ADL Overall ADL's : Needs assistance/impaired                 Upper Body Dressing : Modified independent;Standing   Lower Body Dressing: Modified independent;Sitting/lateral leans;Sit to/from stand   Toilet Transfer: Supervision/safety;Comfort height toilet;Grab bars   Toileting- Clothing Manipulation and Hygiene: Supervision/safety;Sitting/lateral lean;Sit to/from stand       Functional mobility during ADLs: Min guard;Rolling walker General ADL Comments: modified independence to supervisionA for UB ADL and LB ADL     Vision   Vision Assessment?: No apparent visual deficits   Perception     Praxis      Cognition Arousal/Alertness: Awake/alert Behavior During Therapy: WFL for tasks assessed/performed Overall Cognitive Status: Within Functional Limits for tasks assessed                                          Exercises Exercises: General Lower Extremity General Exercises - Lower Extremity Ankle Circles/Pumps: AROM;Right;20 reps Quad Sets: AROM;Both;10 reps Heel Slides: AROM;Both;10 reps;Supine Straight Leg Raises: AROM;Both;10 reps;Supine   Shoulder Instructions       General Comments Pt stood at sink for ADL (minA for UB and  LB ADL    Pertinent Vitals/ Pain       Pain Assessment: 0-10 Pain Score: 4  Pain Location: L ankle, R knee  Pain Descriptors / Indicators: Aching Pain Intervention(s): Monitored during session;Premedicated before session  Home Living                                          Prior Functioning/Environment              Frequency           Progress Toward Goals  OT Goals(current goals can now be found in the care plan section)  Progress towards OT goals: Progressing  toward goals  Acute Rehab OT Goals Patient Stated Goal: to get up and walk OT Goal Formulation: With patient Time For Goal Achievement: 01/23/19 Potential to Achieve Goals: Good ADL Goals Pt Will Perform Grooming: with modified independence;standing Pt Will Perform Lower Body Dressing: with modified independence;sit to/from stand Pt Will Perform Toileting - Clothing Manipulation and hygiene: with modified independence;sit to/from stand Additional ADL Goal #1: Pt will perform ADL in standing with fair balance x5 mins with supervisionA Additional ADL Goal #2: Pt will transfer from various surfaces with good safety awareness with modified independence.  Plan Discharge plan remains appropriate    Co-evaluation                 AM-PAC OT "6 Clicks" Daily Activity     Outcome Measure   Help from another person eating meals?: None Help from another person taking care of personal grooming?: None Help from another person toileting, which includes using toliet, bedpan, or urinal?: None Help from another person bathing (including washing, rinsing, drying)?: None Help from another person to put on and taking off regular upper body clothing?: None Help from another person to put on and taking off regular lower body clothing?: None 6 Click Score: 24    End of Session Equipment Utilized During Treatment: Gait belt;Rolling walker  OT Visit Diagnosis: Unsteadiness on feet (R26.81);Muscle weakness (generalized) (M62.81)   Activity Tolerance Patient limited by pain;Patient tolerated treatment well   Patient Left in chair;with call bell/phone within reach   Nurse Communication Mobility status;Patient requests pain meds        Time: 1110-1140 OT Time Calculation (min): 30 min  Charges: OT General Charges $OT Visit: 1 Visit OT Treatments $Self Care/Home Management : 23-37 mins  Revonda StandardAllison Cecil Cranker(Jelenek) [place] OTR/L Acute Rehabilitation Services Pager: [phone] Office:  [phone]    Lonzo CloudLLISON J Arthor Gorter 01/11/2019, 2:46 PM

## 2019-01-11 NOTE — Progress Notes (Signed)
Pharmacy Antibiotic Note  James Beard. is a 33 y.o. male admitted on 01/07/2019 with septic arthritis and paraspinal abscess.  Patient has a history of IVDU and is s/p I&D of knee. Cultures from the knee are growing MSSA.  Pharmacy consulted to narrow antibiotics to Ancef. Patient to receive 6 weeks of Abx per ID.   Renal function is stable, afebrile, WBC WNL.  Plan: Ancef 2gm IV Q8H Monitor renal fxn, clinical progress   Height: 5' 9.02" (175.3 cm) Weight: 151 lb 7.3 oz (68.7 kg) IBW/kg (Calculated) : 70.74  Temp (24hrs), Avg:98.6 F (37 C), Min:98.4 F (36.9 C), Max:98.8 F (37.1 C)  Recent Labs  Lab 01/07/19 2017 01/08/19 0314 01/09/19 0221 01/10/19 0218 01/11/19 0224  WBC 7.8 8.6 9.1 6.8  --   CREATININE 0.56* 0.62 0.59* 0.55* 0.66    Estimated Creatinine Clearance: 126.4 mL/min (by C-G formula based on SCr of 0.66 mg/dL).    No Known Allergies   Cefepime 5/4 >> 5/5 Vanc 5/5 >> 5/5 Ancef 5/5 >>  5/4 covid - negative 5/4 surgical screen - positive MSSA 5/4 BCx - MSSA 5/5 right knee fluid - MSSA  Anayah Arvanitis A. Jeanella Craze, PharmD, BCPS Clinical Pharmacist [place] Please utilize Amion for appropriate phone number to reach the unit pharmacist [facility])

## 2019-01-11 NOTE — Progress Notes (Signed)
[facility]  Date of Admission:  01/07/2019     Total days of antibiotics 5         ASSESSMENT/PLAN  James Beard is a 33 year old male with history of IV drug use and homelessness admitted with right knee and left foot pain and found to havedisseminated MSSA infection withseptic arthritis of the right knee and large abscess of the left ankle status post incision and drainage of both areasand bacteremia. MRI findings of paraspinal abscess also present. Blood cultures positive for MSSA bacteremia with TTE showing no evidence of endocarditis and preserved heart and valve function. Given history of IV drug use will need to remain inpatient for treatment.   MSSA Bacteremia - Repeat cultures with no growth to date in 2 days. Ok for PICC line if cultures remain clear through 5/9. Continue current dose of Cefazolin  Disseminated MSSA infection of multiple joints and paraspinal abscess - POD #3 and continues to have appropriate soreness in his right knee and left foot. Surgical specimens remain pending with Staphylococcus aureus in the right knee. Will need prolonged therapy of 6 weeks given paraspinal abscess. Continue cefazolin.  Positive Hepatitis C Antibody - RNA levels remain pending. If present will consider outpatient treatment.   Opioid use disorder - Appears stable and comfortable without evidence of withdrawal. Continue management per primary team.   Dr. Drue Second will be available over the weekend as needed  Principal Problem:   Arthritis, septic, knee (HCC) Active Problems:   Opioid use disorder (HCC)   Major depressive disorder, recurrent severe without psychotic features (HCC)   Tenosynovitis of left ankle   Paraspinal abscess (HCC)   . enoxaparin (LOVENOX) injection  40 mg Subcutaneous Q24H  . feeding supplement (ENSURE ENLIVE)  237 mL Oral TID BM  . feeding supplement (PRO-STAT SUGAR FREE 64)  30 mL Oral BID  . methadone  70 mg Oral Daily  .  multivitamin with minerals  1 tablet Oral Daily  . nicotine  21 mg Transdermal Daily  . OLANZapine  2.5 mg Oral QHS  . senna-docusate  1 tablet Oral QHS  . sertraline  50 mg Oral Daily    SUBJECTIVE:  Afebrile overnight with no acute events or complaints. Continues to have post-surgical soreness of the left foot and right knee. Has been up with physical therapy.   No Known Allergies   Review of Systems: Review of Systems  Constitutional: Negative for chills, fever and weight loss.  Respiratory: Negative for cough, shortness of breath and wheezing.   Cardiovascular: Negative for chest pain and leg swelling.  Gastrointestinal: Negative for abdominal pain, constipation, diarrhea, nausea and vomiting.  Musculoskeletal:       Positive for right knee and left foot pain/soreness  Skin: Negative for rash.      OBJECTIVE: Vitals:   01/10/19 1341 01/10/19 2151 01/11/19 0459 01/11/19 0459  BP: (!) 142/90 (!) 142/74  135/75  Pulse: 64 62  (!) 55  Resp:      Temp: 98.4 F (36.9 C) 98.7 F (37.1 C)  98.8 F (37.1 C)  TempSrc: Oral Oral  Oral  SpO2: 98% 98%  98%  Weight:   68.7 kg   Height:       Body mass index is 22.36 kg/m.  Physical Exam Constitutional:      General: He is not in acute distress.    Appearance: He is well-developed.     Comments: Lying in bed with head of bed elevated;  pleasant.   Cardiovascular:     Rate and Rhythm: Normal rate and regular rhythm.     Heart sounds: Normal heart sounds.  Pulmonary:     Effort: Pulmonary effort is normal.     Breath sounds: Normal breath sounds.  Skin:    General: Skin is warm and dry.  Neurological:     Mental Status: He is alert and oriented to person, place, and time.  Psychiatric:        Mood and Affect: Mood normal.     Lab Results Lab Results  Component Value Date   WBC 6.8 01/10/2019   HGB 9.8 (L) 01/10/2019   HCT 30.4 (L) 01/10/2019   MCV 91.0 01/10/2019   PLT 323 01/10/2019    Lab Results   Component Value Date   CREATININE 0.66 01/11/2019   BUN 13 01/11/2019   NA 140 01/11/2019   K 4.0 01/11/2019   CL 99 01/11/2019   CO2 33 (H) 01/11/2019    Lab Results  Component Value Date   ALT 35 01/07/2019   AST 22 01/07/2019   ALKPHOS 196 (H) 01/07/2019   BILITOT 0.6 01/07/2019     Microbiology: Recent Results (from the past 240 hour(s))  Surgical pcr screen     Status: Abnormal   Collection Time: 01/07/19 12:13 AM  Result Value Ref Range Status   MRSA, PCR NEGATIVE NEGATIVE Final   Staphylococcus aureus POSITIVE (A) NEGATIVE Final    Comment: (NOTE) The Xpert SA Assay (FDA approved for NASAL specimens in patients 38 years of age and older), is one component of a comprehensive surveillance program. It is not intended to diagnose infection nor to guide or monitor treatment. Performed at [facility], [address], [place], [place] [address]   Body fluid culture     Status: None   Collection Time: 01/07/19  2:28 PM  Result Value Ref Range Status   Specimen Description FLUID RIGHT KNEE  Final   Special Requests LOOK FOR GC PER MD  Final   Gram Stain   Final    ABUNDANT WBC PRESENT, PREDOMINANTLY PMN NO ORGANISMS SEEN    Culture   Final    RARE STAPHYLOCOCCUS AUREUS CRITICAL RESULT CALLED TO, READ BACK BY AND VERIFIED WITH: RN Freeman Caldron [id number] AT 1003 BY CM Performed at [facility], [address], [place], [place] [address]    Report Status 01/10/2019 FINAL  Final   Organism ID, Bacteria STAPHYLOCOCCUS AUREUS  Final      Susceptibility   Staphylococcus aureus - MIC*    CIPROFLOXACIN <=0.5 SENSITIVE Sensitive     ERYTHROMYCIN <=0.25 SENSITIVE Sensitive     GENTAMICIN <=0.5 SENSITIVE Sensitive     OXACILLIN 0.5 SENSITIVE Sensitive     TETRACYCLINE <=1 SENSITIVE Sensitive     VANCOMYCIN 1 SENSITIVE Sensitive     TRIMETH/SULFA <=10 SENSITIVE Sensitive     CLINDAMYCIN <=0.25 SENSITIVE Sensitive     RIFAMPIN <=0.5 SENSITIVE Sensitive     Inducible  Clindamycin NEGATIVE Sensitive     * RARE STAPHYLOCOCCUS AUREUS  SARS Coronavirus 2 (CEPHEID - Performed in [facility] hospital lab), Hosp Order     Status: None   Collection Time: 01/07/19  8:17 PM  Result Value Ref Range Status   SARS Coronavirus 2 NEGATIVE NEGATIVE Final    Comment: (NOTE) If result is NEGATIVE SARS-CoV-2 target nucleic acids are NOT DETECTED. The SARS-CoV-2 RNA is generally detectable in upper and lower  respiratory specimens  during the acute phase of infection. The lowest  concentration of SARS-CoV-2 viral copies this assay can detect is 250  copies / mL. A negative result does not preclude SARS-CoV-2 infection  and should not be used as the sole basis for treatment or other  patient management decisions.  A negative result may occur with  improper specimen collection / handling, submission of specimen other  than nasopharyngeal swab, presence of viral mutation(s) within the  areas targeted by this assay, and inadequate number of viral copies  (<250 copies / mL). A negative result must be combined with clinical  observations, patient history, and epidemiological information. If result is POSITIVE SARS-CoV-2 target nucleic acids are DETECTED. The SARS-CoV-2 RNA is generally detectable in upper and lower  respiratory specimens dur ing the acute phase of infection.  Positive  results are indicative of active infection with SARS-CoV-2.  Clinical  correlation with patient history and other diagnostic information is  necessary to determine patient infection status.  Positive results do  not rule out bacterial infection or co-infection with other viruses. If result is PRESUMPTIVE POSTIVE SARS-CoV-2 nucleic acids MAY BE PRESENT.   A presumptive positive result was obtained on the submitted specimen  and confirmed on repeat testing.  While 2019 novel coronavirus  (SARS-CoV-2) nucleic acids may be present in the submitted sample  additional confirmatory testing may be  necessary for epidemiological  and / or clinical management purposes  to differentiate between  SARS-CoV-2 and other Sarbecovirus currently known to infect humans.  If clinically indicated additional testing with an alternate test  methodology [phone] is advised. The SARS-CoV-2 RNA is generally  detectable in upper and lower respiratory sp ecimens during the acute  phase of infection. The expected result is Negative. Fact Sheet for Patients:  BoilerBrush.com.cyhttps://www.fda.gov/media/136312/download Fact Sheet for Healthcare Providers: https://pope.com/https://www.fda.gov/media/136313/download This test is not yet approved or cleared by the [place] FDA and has been authorized for detection and/or diagnosis of SARS-CoV-2 by FDA under an Emergency Use Authorization (EUA).  This EUA will remain in effect (meaning this test can be used) for the duration of the COVID-19 declaration under Section 564(b)(1) of the Act, 21 U.S.C. section 360bbb-3(b)(1), unless the authorization is terminated or revoked sooner. Performed at [facility], [address] [place], [place] [address]   Blood culture (routine x 2)     Status: Abnormal   Collection Time: 01/07/19  8:17 PM  Result Value Ref Range Status   Specimen Description BLOOD RIGHT ANTECUBITAL  Final   Special Requests   Final    BOTTLES DRAWN AEROBIC AND ANAEROBIC Blood Culture results may not be optimal due to an excessive volume of blood received in culture bottles   Culture  Setup Time   Final    AEROBIC BOTTLE ONLY GRAM POSITIVE COCCI CRITICAL RESULT CALLED TO, READ BACK BY AND VERIFIED WITH: Lytle ButteM MACCIA [facility] 01/08/19 1917 JDW Performed at [facility], [address] [place], [place] [address]    Culture STAPHYLOCOCCUS AUREUS (A)  Final   Report Status 01/10/2019 FINAL  Final   Organism ID, Bacteria STAPHYLOCOCCUS AUREUS  Final      Susceptibility   Staphylococcus aureus - MIC*    CIPROFLOXACIN <=0.5 SENSITIVE Sensitive     ERYTHROMYCIN <=0.25  SENSITIVE Sensitive     GENTAMICIN <=0.5 SENSITIVE Sensitive     OXACILLIN 0.5 SENSITIVE Sensitive     TETRACYCLINE <=1 SENSITIVE Sensitive     VANCOMYCIN 1 SENSITIVE Sensitive  TRIMETH/SULFA <=10 SENSITIVE Sensitive     CLINDAMYCIN <=0.25 SENSITIVE Sensitive     RIFAMPIN <=0.5 SENSITIVE Sensitive     Inducible Clindamycin NEGATIVE Sensitive     * STAPHYLOCOCCUS AUREUS  Blood Culture ID Panel (Reflexed)     Status: Abnormal   Collection Time: 01/07/19  8:17 PM  Result Value Ref Range Status   Enterococcus species NOT DETECTED NOT DETECTED Final   Listeria monocytogenes NOT DETECTED NOT DETECTED Final   Staphylococcus species DETECTED (A) NOT DETECTED Final    Comment: CRITICAL RESULT CALLED TO, READ BACK BY AND VERIFIED WITH: Lytle Butte [facility] 01/08/19 1917 JDW    Staphylococcus aureus (BCID) DETECTED (A) NOT DETECTED Final    Comment: Methicillin (oxacillin) susceptible Staphylococcus aureus (MSSA). Preferred therapy is anti staphylococcal beta lactam antibiotic (Cefazolin or Nafcillin), unless clinically contraindicated. CRITICAL RESULT CALLED TO, READ BACK BY AND VERIFIED WITH: Lytle Butte [facility] 01/08/19 1917 JDW    Methicillin resistance NOT DETECTED NOT DETECTED Final   Streptococcus species NOT DETECTED NOT DETECTED Final   Streptococcus agalactiae NOT DETECTED NOT DETECTED Final   Streptococcus pneumoniae NOT DETECTED NOT DETECTED Final   Streptococcus pyogenes NOT DETECTED NOT DETECTED Final   Acinetobacter baumannii NOT DETECTED NOT DETECTED Final   Enterobacteriaceae species NOT DETECTED NOT DETECTED Final   Enterobacter cloacae complex NOT DETECTED NOT DETECTED Final   Escherichia coli NOT DETECTED NOT DETECTED Final   Klebsiella oxytoca NOT DETECTED NOT DETECTED Final   Klebsiella pneumoniae NOT DETECTED NOT DETECTED Final   Proteus species NOT DETECTED NOT DETECTED Final   Serratia marcescens NOT DETECTED NOT DETECTED Final   Haemophilus influenzae NOT DETECTED NOT  DETECTED Final   Neisseria meningitidis NOT DETECTED NOT DETECTED Final   Pseudomonas aeruginosa NOT DETECTED NOT DETECTED Final   Candida albicans NOT DETECTED NOT DETECTED Final   Candida glabrata NOT DETECTED NOT DETECTED Final   Candida krusei NOT DETECTED NOT DETECTED Final   Candida parapsilosis NOT DETECTED NOT DETECTED Final   Candida tropicalis NOT DETECTED NOT DETECTED Final    Comment: Performed at [facility], [address] [place], [place] [address]  Blood culture (routine x 2)     Status: None (Preliminary result)   Collection Time: 01/07/19  8:34 PM  Result Value Ref Range Status   Specimen Description BLOOD LEFT ANTECUBITAL  Final   Special Requests   Final    BOTTLES DRAWN AEROBIC AND ANAEROBIC Blood Culture results may not be optimal due to an excessive volume of blood received in culture bottles   Culture   Final    NO GROWTH 4 DAYS Performed at [facility], [address] [place], [place] [address]    Report Status PENDING  Incomplete  Anaerobic culture     Status: None (Preliminary result)   Collection Time: 01/08/19 11:40 AM  Result Value Ref Range Status   Specimen Description FLUID  Final   Special Requests   Final    B LEFT ANKLE Performed at [facility], [address] [place], [place] [address]    Gram Stain PENDING  Incomplete   Culture   Final    NO ANAEROBES ISOLATED; CULTURE IN PROGRESS FOR 5 DAYS   Report Status PENDING  Incomplete  Aerobic Culture (superficial specimen)     Status: None   Collection Time: 01/08/19 11:40 AM  Result Value Ref Range Status   Specimen Description FLUID  Final   Special Requests B LEFT ANKLE  Final   Gram Stain   Final    RARE WBC PRESENT, PREDOMINANTLY PMN NO ORGANISMS SEEN    Culture   Final    NO GROWTH 2 DAYS Performed at [facility], [address] [place], [place] [address]    Report Status 01/10/2019 FINAL  Final  Acid Fast Smear (AFB)     Status: None   Collection  Time: 01/08/19 11:40 AM  Result Value Ref Range Status   AFB Specimen Processing Comment  Final    Comment: Tissue Grinding and Digestion/Decontamination   Acid Fast Smear Negative  Final    Comment: (NOTE) Performed At: [facility] [address] [place], [place] [id number] Jolene Schimke MD [id number]    Source (AFB) FLUID  Final    Comment: Performed at [facility], [address] [place], [place] [address]  Anaerobic culture     Status: None (Preliminary result)   Collection Time: 01/08/19 11:45 AM  Result Value Ref Range Status   Specimen Description FLUID  Final   Special Requests   Final    A RIGHT KNEE Performed at [facility], [address] [place], [place] [address]    Gram Stain PENDING  Incomplete   Culture   Final    NO ANAEROBES ISOLATED; CULTURE IN PROGRESS FOR 5 DAYS   Report Status PENDING  Incomplete  Aerobic Culture (superficial specimen)     Status: None (Preliminary result)   Collection Time: 01/08/19 11:45 AM  Result Value Ref Range Status   Specimen Description FLUID  Final   Special Requests A RIGHT KNEE  Final   Gram Stain   Final    MODERATE WBC PRESENT, PREDOMINANTLY PMN NO ORGANISMS SEEN    Culture   Final    RARE STAPHYLOCOCCUS AUREUS SUSCEPTIBILITIES TO FOLLOW CRITICAL RESULT CALLED TO, READ BACK BY AND VERIFIED WITH: RN Nada Boozer [id number] AT 1023 AM BY CM Performed at [facility], [address], [place], [place] [address]    Report Status PENDING  Incomplete  Acid Fast Smear (AFB)     Status: None   Collection Time: 01/08/19 11:45 AM  Result Value Ref Range Status   AFB Specimen Processing Concentration  Final   Acid Fast Smear Negative  Final    Comment: (NOTE) Performed At: [facility] [address] [place], [place] [id number] Jolene Schimke MD [id number]    Source (AFB) FLUID  Final    Comment: Performed at [facility], [address], [place], [place] [address]  Culture,  blood (routine x 2)     Status: None (Preliminary result)   Collection Time: 01/09/19 11:54 AM  Result Value Ref Range Status   Specimen Description BLOOD LEFT ANTECUBITAL  Final   Special Requests   Final    BOTTLES DRAWN AEROBIC ONLY Blood Culture adequate volume   Culture   Final    NO GROWTH 2 DAYS Performed at [facility], [address], [place], [place] [address]    Report Status PENDING  Incomplete  Culture, blood (routine x 2)     Status: None (Preliminary result)   Collection Time: 01/09/19 11:54 AM  Result Value Ref Range Status   Specimen Description BLOOD LEFT ANTECUBITAL  Final   Special Requests   Final    BOTTLES DRAWN AEROBIC ONLY Blood Culture adequate volume   Culture   Final    NO GROWTH 2 DAYS Performed at [facility], [address], [place], [place] [address]    Report Status PENDING  Incomplete     James Eke, NP [facility] [phone] Pager  01/11/2019  11:10 AM

## 2019-01-11 NOTE — Progress Notes (Addendum)
   Subjective: No overnight events. James Beard continues to make progress with his right knee and left ankle range of motion and strength. Back pain has improved. No acute concerns.   Objective:  Vital signs in last 24 hours: Vitals:   01/10/19 1341 01/10/19 2151 01/11/19 0459 01/11/19 0459  BP: (!) 142/90 (!) 142/74  135/75  Pulse: 64 62  (!) 55  Resp:      Temp: 98.4 F (36.9 C) 98.7 F (37.1 C)  98.8 F (37.1 C)  TempSrc: Oral Oral  Oral  SpO2: 98% 98%  98%  Weight:   68.7 kg   Height:       Gen: alert and oriented, no distress CV: RRR, systolic murmur Pulm: CTAB, normal effort on room air Abd: bs+, soft, non-distended, non-tender Ext: right knee and left ankle with clean wrap in place, BLE are neurovascularly intact  Assessment/Plan:  Principal Problem:   Arthritis, septic, knee (HCC) Active Problems:   Opioid use disorder (HCC)   Major depressive disorder, recurrent severe without psychotic features (HCC)   Tenosynovitis of left ankle   Paraspinal abscess [facility])  James Beard an unhomed72 year old male with bipolar disorder, and MDD, and substance use disorder who presentedwithright knee and left ankle pain and swelling as well as lumbar spine pain in the setting of regular IV drug use.  MSSA bacteremia Right knee septic arthritis Left ankle septic tenosynovitis T10-T12 right paraspinal abscess Afebrilewithout leukocytosis. Underwent right knee and left ankle washout with ortho 5/5. Right knee synovial fluid culture growing staph aureus. Neurosurgery does not recommend intervention for paraspinal abscess. IR only drains lumbar abscesses. ID agrees with medical management at this time. Will continue to monitor back pain and possibly re-image to ensure improvement in the future. Repeat blood cx from 5/6 with no growth to date. - ID following, appreciate recs  - Ortho following, appreciate recs -IV Ancef for 6 weeks - Place PICC line if 5/6 cultures are  negative through 5/9 -Pain control: scheduledtoradol q6hrsand percocet 7.5-325 mg q6hrsprn - Continue working with PT/OT  Inflammatory arthritis: Thoracic/lumbar MRI showed inflammatory arthritic changes of the costovertebral jointsthroughout the thoracic spineand bilateral sacoiliitis with fusion of the SI joints consistent with inflammatory arthritis. May be traumatic arthritis or ankylosing spondylitis. - F/u HLA-B27   Substance use disorder: Regularly uses heroin, crystal meth, and crack cocaine. Also gets daily methadone with ADS.  -Continue methadone75m daily - SW consulted for housing situation and substance use disorder - Bowel regimen to prevent constipation - Hep B vaccine (repeat in 1 month and 6 months)  Hepatitis C positive antibody: HCV antibody positive three months ago. Patient unaware of Hep C hx.  -F/uHCV RNA quant  Bipolar disorder/major depressive disorder:Stopped takingolanzapine and sertraline2 months ago as he did not pick up his medication. Resumed on admission. - Continueolanzapine 2.5 mg daily andsertraline 100 mg daily  Malnutrition Will monitor electrolytes for refeeding syndrome as patient has hx of recent weight loss and poor PO intake. K, Mag wnl. Phos 5.2 this am. - Ensure supplement  Dispo: Anticipated discharge in approximately   James Beard, James Elk MD 01/11/2019, 6:51 AM Pager: [phone]

## 2019-01-11 NOTE — Progress Notes (Signed)
Physical Therapy Treatment Patient Details Name: James BromeCameron R Cremer Jr. MRN: [id number] DOB: 1986-07-22 Today's Date: 01/11/2019    History of Present Illness Pt is a 33 y/o male admitted secondary to worsening R knee pain. Found to have septic arthritis in R knee and septic tenosynovitis in L ankle. Pt is s/p I and D of R knee and L ankle.     PT Comments    Pt progressing well towards goals. Requiring min guard to supervision for mobility tasks with RW. Able to increase ambulation distance this session. Educated about supine HEP. Current recommendations appropriate. Will continue to follow acutely to maximize functional mobility independence and safety.     Follow Up Recommendations  Other (comment)(would benefit from follow up PT, however, pt homeless)     Equipment Recommendations  Rolling walker with 5" wheels    Recommendations for Other Services       Precautions / Restrictions Precautions Precautions: Fall Required Braces or Orthoses: Other Brace Other Brace: L CAM walker boot Restrictions Weight Bearing Restrictions: No Other Position/Activity Restrictions: L CAM walker boot to be on during ambulation     Mobility  Bed Mobility Overal bed mobility: Modified Independent                Transfers Overall transfer level: Needs assistance Equipment used: Rolling walker (2 wheeled) Transfers: Sit to/from Stand Sit to Stand: Supervision         General transfer comment: Supervision for safety. Cues for hand placement.  Ambulation/Gait Ambulation/Gait assistance: Min guard Gait Distance (Feet): 100 Feet Assistive device: Rolling walker (2 wheeled) Gait Pattern/deviations: Step-to pattern;Step-through pattern;Decreased step length - right;Decreased step length - left;Antalgic Gait velocity: Decreased    General Gait Details: Slow, antalgic gait. Worked on increasing weightbearing on LLE and taking longer steps with RLE this session. Cues for heel to toe  gait pattern. Pt reporting increased pain which limited gait distance.    Stairs             Wheelchair Mobility    Modified Rankin (Stroke Patients Only)       Balance Overall balance assessment: Needs assistance Sitting-balance support: No upper extremity supported;Feet supported Sitting balance-Leahy Scale: Good     Standing balance support: Bilateral upper extremity supported;During functional activity Standing balance-Leahy Scale: Poor Standing balance comment: Reliant on UE support; very little weight through RLE due to knee pain                            Cognition Arousal/Alertness: Awake/alert Behavior During Therapy: WFL for tasks assessed/performed Overall Cognitive Status: Within Functional Limits for tasks assessed                                        Exercises General Exercises - Lower Extremity Ankle Circles/Pumps: AROM;Right;20 reps Quad Sets: AROM;Both;10 reps Heel Slides: AROM;Both;10 reps;Supine Straight Leg Raises: AROM;Both;10 reps;Supine    General Comments        Pertinent Vitals/Pain Pain Assessment: 0-10 Pain Score: 7  Pain Location: L ankle, R knee  Pain Descriptors / Indicators: Aching Pain Intervention(s): Limited activity within patient's tolerance;Monitored during session;Repositioned    Home Living                      Prior Function  PT Goals (current goals can now be found in the care plan section) Acute Rehab PT Goals Patient Stated Goal: to get up and walk PT Goal Formulation: With patient Time For Goal Achievement: 01/23/19 Potential to Achieve Goals: Good Progress towards PT goals: Progressing toward goals    Frequency    Min 5X/week      PT Plan Current plan remains appropriate    Co-evaluation              AM-PAC PT "6 Clicks" Mobility   Outcome Measure  Help needed turning from your back to your side while in a flat bed without using  bedrails?: None Help needed moving from lying on your back to sitting on the side of a flat bed without using bedrails?: None Help needed moving to and from a bed to a chair (including a wheelchair)?: A Little Help needed standing up from a chair using your arms (e.g., wheelchair or bedside chair)?: A Little Help needed to walk in hospital room?: A Little Help needed climbing 3-5 steps with a railing? : A Lot 6 Click Score: 19    End of Session Equipment Utilized During Treatment: Gait belt Activity Tolerance: Patient tolerated treatment well Patient left: in chair;with call bell/phone within reach Nurse Communication: Mobility status PT Visit Diagnosis: Other abnormalities of gait and mobility (R26.89);Pain Pain - part of body: Knee;Ankle and joints of foot(L ankle, R knee)     Time: 4235-3614 PT Time Calculation (min) (ACUTE ONLY): 13 min  Charges:  $Gait Training: 8-22 mins                     Gladys Damme, PT, DPT  Acute Rehabilitation Services  Pager: [phone] Office: [phone]    James Beard 01/11/2019, 1:07 PM

## 2019-01-11 NOTE — Progress Notes (Signed)
[facility] called with result of right knee culture. Rare staph aureus; sensitivity available 01-12-2019. Notified Dr. Avie Arenas.

## 2019-01-12 DIAGNOSIS — F1910 Other psychoactive substance abuse, uncomplicated: Secondary | ICD-10-CM

## 2019-01-12 LAB — AEROBIC CULTURE W GRAM STAIN (SUPERFICIAL SPECIMEN)

## 2019-01-12 LAB — AEROBIC CULTURE? (SUPERFICIAL SPECIMEN)

## 2019-01-12 MED ORDER — SODIUM CHLORIDE 0.9 % IV BOLUS
1000.0000 mL | Freq: Once | INTRAVENOUS | Status: AC
  Administered 2019-01-12: 14:00:00 1000 mL via INTRAVENOUS

## 2019-01-12 MED ORDER — KETOROLAC TROMETHAMINE 15 MG/ML IJ SOLN
15.0000 mg | Freq: Four times a day (QID) | INTRAMUSCULAR | Status: AC | PRN
  Administered 2019-01-12 – 2019-01-13 (×2): 15 mg via INTRAVENOUS
  Filled 2019-01-12 (×2): qty 1

## 2019-01-12 NOTE — Progress Notes (Addendum)
   Subjective: No overnight events. James Beard continues to make progress with his right knee and left ankle range of motion and strength. He tells me he walked much further yesterday without issue and noted increased flexibility in his knee.  Pain continues to improve.  Objective:  Vital signs in last 24 hours: Vitals:   01/11/19 1425 01/11/19 2303 01/12/19 0449 01/12/19 0648  BP: 121/78 129/85  119/67  Pulse: 64 69  65  Resp:  16  16  Temp: 98.6 F (37 C) 99.1 F (37.3 C)  (!) 97 F (36.1 C)  TempSrc: Oral Oral    SpO2: 98% 97%  97%  Weight:   63.3 kg   Height:       Gen: alert and oriented, no distress CV: RRR, systolic murmur Pulm: CTAB, normal effort on room air Abd: bs+, soft, non-distended, non-tender Ext: right knee and left ankle with clean wrap in place, BLE are neurovascularly intact  Assessment/Plan:  Principal Problem:   Arthritis, septic, knee (HCC) Active Problems:   Opioid use disorder (HCC)   Major depressive disorder, recurrent severe without psychotic features (HCC)   Tenosynovitis of left ankle   Paraspinal abscess [facility])  James Beard an unhomed11 year old male with bipolar disorder, and MDD, and substance use disorder who presentedwithright knee and left ankle pain and swelling as well as lumbar spine pain in the setting of regular IV drug use.  MSSA bacteremia Right knee septic arthritis Left ankle septic tenosynovitis T10-T12 right paraspinal abscess Afebrilewithout leukocytosis. Underwent right knee and left ankle washout with ortho 5/5. Right knee synovial fluid culture growing staph aureus. Neurosurgery does not recommend intervention for paraspinal abscess. IR only drains lumbar abscesses. ID agrees with medical management at this time. Will continue to monitor back pain and possibly re-image to ensure improvement in the future. Repeat blood cx from 5/6 with no growth to date. - ID following, appreciate recs  - Ortho following,  appreciate recs -IV Ancef for 6 weeks - Place PICC line if 5/6 cultures are negative through 5/9 -Pain control: PRN toradol q6hrsand percocet 7.5-325 mg q6hrsprn - Continue working with PT/OT  Inflammatory arthritis: Thoracic/lumbar MRI showed inflammatory arthritic changes of the costovertebral jointsthroughout the thoracic spineand bilateral sacoiliitis with fusion of the SI joints consistent with inflammatory arthritis. May be traumatic arthritis or ankylosing spondylitis. - NSAID's PRN, F/u HLA-B27   Substance use disorder: Regularly uses heroin, crystal meth, and crack cocaine. Also gets daily methadone with ADS.  -Continue methadone'69mg'$  daily - SW consulted for housing situation and substance use disorder - Bowel regimen to prevent constipation - given Hep B vaccine (repeat in 1 month and 6 months)  Hepatitis C positive antibody: HCV antibody positive three months ago. Patient unaware of Hep C hx.  -F/uHCV RNA quant  Bipolar disorder/major depressive disorder:Stopped takingolanzapine and sertraline2 months ago as he did not pick up his medication. Resumed on admission. - Continueolanzapine 2.5 mg daily andsertraline 100 mg daily  Malnutrition Will monitor electrolytes for refeeding syndrome as patient has hx of recent weight loss and poor PO intake. K, Mag, phos wnl - Ensure supplement  Dispo: Anticipated discharge post infection treatment   James Roan, MD 01/12/2019, 9:25 AM

## 2019-01-13 LAB — CBC
HCT: 36.1 % — ABNORMAL LOW (ref 39.0–52.0)
Hemoglobin: 11.4 g/dL — ABNORMAL LOW (ref 13.0–17.0)
MCH: 28.6 pg (ref 26.0–34.0)
MCHC: 31.6 g/dL (ref 30.0–36.0)
MCV: 90.5 fL (ref 80.0–100.0)
Platelets: 439 10*3/uL — ABNORMAL HIGH (ref 150–400)
RBC: 3.99 MIL/uL — ABNORMAL LOW (ref 4.22–5.81)
RDW: 13 % (ref 11.5–15.5)
WBC: 9.3 10*3/uL (ref 4.0–10.5)
nRBC: 0 % (ref 0.0–0.2)

## 2019-01-13 LAB — ANAEROBIC CULTURE

## 2019-01-13 LAB — BASIC METABOLIC PANEL
Anion gap: 13 (ref 5–15)
BUN: 18 mg/dL (ref 6–20)
CO2: 30 mmol/L (ref 22–32)
Calcium: 9.4 mg/dL (ref 8.9–10.3)
Chloride: 92 mmol/L — ABNORMAL LOW (ref 98–111)
Creatinine, Ser: 0.77 mg/dL (ref 0.61–1.24)
GFR calc Af Amer: >60 mL/min (ref >60)
GFR calc non Af Amer: >60 mL/min (ref >60)
Glucose, Bld: 82 mg/dL (ref 70–99)
Potassium: 4.9 mmol/L (ref 3.5–5.1)
Sodium: 135 mmol/L (ref 135–145)

## 2019-01-13 LAB — CULTURE, BLOOD (ROUTINE X 2): Culture: NO GROWTH

## 2019-01-13 MED ORDER — KETOROLAC TROMETHAMINE 15 MG/ML IJ SOLN
15.0000 mg | Freq: Four times a day (QID) | INTRAMUSCULAR | Status: DC | PRN
  Administered 2019-01-13 – 2019-01-17 (×8): 15 mg via INTRAVENOUS
  Filled 2019-01-13 (×9): qty 1

## 2019-01-13 NOTE — Progress Notes (Signed)
   Subjective: Had some pain overnight requiring toradol, continues to have improved range of motion and mobility of knee and ankle.    Objective:  Vital signs in last 24 hours: Vitals:   01/12/19 0648 01/12/19 1316 01/12/19 2245 01/13/19 0608  BP: 119/67 (!) 122/109 122/64 (!) 109/59  Pulse: 65 63 65 65  Resp: '16 14 18 17  '$ Temp: (!) 97 F (36.1 C) 98 F (36.7 C) 98.7 F (37.1 C) 99 F (37.2 C)  TempSrc:  Oral Oral   SpO2: 97% 99% 99% 98%  Weight:    63 kg  Height:       Gen: alert and oriented, no distress CV: RRR, systolic murmur Pulm: CTAB, normal effort on room air Abd: bs+, soft, non-distended, non-tender Ext: right knee and left ankle with clean wrap in place  Assessment/Plan:  Principal Problem:   Arthritis, septic, knee (HCC) Active Problems:   Opioid use disorder (HCC)   Major depressive disorder, recurrent severe without psychotic features ([place])   Tenosynovitis of left ankle   Paraspinal abscess ([place])   IV drug abuse [facility])  Mr. Kingsley an unhomed89 year old male with bipolar disorder, and MDD, and substance use disorder who presentedwithright knee and left ankle pain and swelling as well as lumbar spine pain in the setting of regular IV drug use.  MSSA bacteremia Right knee septic arthritis Left ankle septic tenosynovitis T10-T12 right paraspinal abscess Afebrilewithout leukocytosis. Underwent right knee and left ankle washout with ortho 5/5. Right knee synovial fluid culture growing staph aureus. Neurosurgery does not recommend intervention for paraspinal abscess. IR only drains lumbar abscesses. ID agrees with medical management at this time. Will continue to monitor back pain and possibly re-image to ensure improvement in the future. Repeat blood cx from 5/6 with no growth to date. - ID following, appreciate recs  - Ortho following, appreciate recs -IV Ancef for 6 weeks - Place PICC line on 5/11 if 5/6 cultures are negative through 5/9 -Pain  control: PRN toradol q6hrsand percocet 7.5-325 mg q6hrsprn - Continue working with PT/OT  Inflammatory arthritis: Thoracic/lumbar MRI showed inflammatory arthritic changes of the costovertebral jointsthroughout the thoracic spineand bilateral sacoiliitis with fusion of the SI joints consistent with inflammatory arthritis. May be traumatic arthritis or ankylosing spondylitis. - NSAID's PRN, F/u HLA-B27   Substance use disorder: Regularly uses heroin, crystal meth, and crack cocaine. Also gets daily methadone with ADS.  -Continue methadone'69mg'$  daily - SW consulted for housing situation and substance use disorder - Bowel regimen to prevent constipation - given Hep B vaccine 5/7 (repeat in 1 month and 6 months)  Hepatitis C positive antibody: HCV antibody positive three months ago. Patient unaware of Hep C hx.  -F/uHCV RNA quant  Bipolar disorder/major depressive disorder:Stopped takingolanzapine and sertraline2 months ago as he did not pick up his medication. Resumed on admission. - Continueolanzapine 2.5 mg daily andsertraline 100 mg daily  Malnutrition Will monitor electrolytes for refeeding syndrome as patient has hx of recent weight loss and poor PO intake. K, Mag, phos wnl - Ensure supplement  Metabolic Alkalosis: likely contraction  -check bmp today to monitor response to IVF yesterday  Dispo: Anticipated discharge post infection treatment   [name], MD 01/13/2019, 11:48 AM

## 2019-01-13 NOTE — Progress Notes (Signed)
PATIENT ID: James Beard.  MRN: [id number]  DOB/AGE:  November 06, 1985 / 33 y.o.  5 Days Post-Op Procedure(s) (LRB): IRRIGATION AND DEBRIDEMENT OF ABSCESS RIGHT KNEE AND LEFT ANKLE (Bilateral)    PROGRESS NOTE Subjective:   Patient is alert, oriented, no Nausea, no Vomiting, yes passing gas, yes Bowel Movement. Taking PO well. Denies SOB, Chest or Calf Pain. Using Incentive Spirometer, PAS in place. Ambulate WBAT with walker, pt walking 100 ft with therapy , Patient reports pain as mild and moderate,     Objective: Vital signs in last 24 hours: Temp:  [98 F (36.7 C)-99 F (37.2 C)] 99 F (37.2 C) (05/10 0608) Pulse Rate:  [63-65] 65 (05/10 0608) Resp:  [14-18] 17 (05/10 0608) BP: (109-122)/(59-109) 109/59 (05/10 0608) SpO2:  [98 %-99 %] 98 % (05/10 2725) Weight:  [63 kg] 63 kg (05/10 0608)    Intake/Output from previous day: I/O last 3 completed shifts: In: 2423.5 [P.O.:1797; I.V.:321.5; IV Piggyback:305] Out: 4745 [Urine:4745]   Intake/Output this shift: No intake/output data recorded.   LABORATORY DATA: Recent Labs    01/11/19 0224  NA 140  K 4.0  CL 99  CO2 33*  BUN 13  CREATININE 0.66  GLUCOSE 113*  CALCIUM 8.3*    Examination: Neurologically intact Neurovascular intact Sensation intact distally Intact pulses distally Dorsiflexion/Plantar flexion intact Incision: dressing C/D/I and scant drainage No cellulitis present Compartment soft} Dressing to left ankle was changed and new telfa and kerlex placed over incision with ace bandage. Right knee ROM is equal to left with no pain.  Assessment:   5 Days Post-Op Procedure(s) (LRB): IRRIGATION AND DEBRIDEMENT OF ABSCESS RIGHT KNEE AND LEFT ANKLE (Bilateral) ADDITIONAL DIAGNOSIS:  Hep C  Plan: Continue to follow treatment plan set forth by Infectious disease with IV abx.   Weight Bearing as Tolerated (WBAT) with a walker  DISCHARGE NEEDS: IV Antibiotics   From an orthopedic standpoint no further  surgery required.  We will continue to monitor pain level.  I encouraged him to work on knee range of motion.  We will continue to follow while admitted.  He will follow-up with me in 2 weeks for wound check and suture removal.    Dannielle Burn 01/13/2019, 9:04 AM

## 2019-01-14 ENCOUNTER — Inpatient Hospital Stay: Payer: Self-pay

## 2019-01-14 DIAGNOSIS — M25512 Pain in left shoulder: Secondary | ICD-10-CM

## 2019-01-14 DIAGNOSIS — R7881 Bacteremia: Secondary | ICD-10-CM | POA: Diagnosis present

## 2019-01-14 DIAGNOSIS — B9561 Methicillin susceptible Staphylococcus aureus infection as the cause of diseases classified elsewhere: Secondary | ICD-10-CM | POA: Diagnosis present

## 2019-01-14 DIAGNOSIS — E873 Alkalosis: Secondary | ICD-10-CM

## 2019-01-14 LAB — CULTURE, BLOOD (ROUTINE X 2)
Culture: NO GROWTH
Culture: NO GROWTH
Special Requests: ADEQUATE
Special Requests: ADEQUATE

## 2019-01-14 MED ORDER — SODIUM CHLORIDE 0.9% FLUSH
10.0000 mL | INTRAVENOUS | Status: DC | PRN
  Administered 2019-01-18 – 2019-01-28 (×4): 10 mL
  Filled 2019-01-14 (×4): qty 40

## 2019-01-14 NOTE — Progress Notes (Signed)
   Subjective: No overnight events. James Beard reports that he is tired today, but his pain is well-controlled with medication and he was able to walk a mile yesterday. He has no acute concerns today and all of his questions were answered.   Objective:  Vital signs in last 24 hours: Vitals:   01/13/19 0608 01/13/19 1548 01/13/19 2212 01/14/19 0556  BP: (!) 109/59 124/67 130/63 128/71  Pulse: 65 68 65 85  Resp: _0 Temp: 99 F (37.2 C) 98.2 F (36.8 C) 98.5 F (36.9 C) 98.2 F (36.8 C)  TempSrc:  Oral Oral Oral  SpO2: 98% 97% 98% 98%  Weight: 63 kg     Height:       Gen: alert and oriented, no distress CV: RRR, systolic murmur Pulm: CTAB, normal effort on room air Abd: bs+, soft, non-distended, non-tender Ext: no edema  Assessment/Plan:  Principal Problem:   Arthritis, septic, knee (HCC) Active Problems:   Opioid use disorder (HCC)   Major depressive disorder, recurrent severe without psychotic features (HCC)   Tenosynovitis of left ankle   Paraspinal abscess ([place])   IV drug abuse [facility])  James Beard an unhomed66 year old male with bipolar disorder, and MDD, and substance use disorder who presentedwithright knee and left ankle pain and swelling as well as lumbar spine pain in the setting of regular IV drug use.  MSSA bacteremia Right knee septic arthritis Left ankle septic tenosynovitis T10-T12 right paraspinal abscess Both blood and right knee synovial fluid culture grew MSSA. Underwent right knee and left ankle washout with ortho 5/5. Infection well-controlled on IV ancef, patient remains afebrilewithout leukocytosis. Neurosurgery does not recommend intervention for paraspinal abscess, and IR only drains lumbar abscesses. ID agrees with medical management of paraspinal abscess at this time. Will continue to monitor back pain and possibly re-image to ensure improvement in the future. Repeat blood cx from 5/6 with no growth to date. - ID following,  appreciate recs  - Ortho following, appreciate recs -IV Ancef for 6 weeks - Place PICC line -Pain control: PRN toradol q6hrsand percocet 7.5-325 mg q6hrsprn -Continue working withPT/OT  Inflammatory arthritis: Thoracic/lumbar MRI showed inflammatory arthritic changes of the costovertebral jointsthroughout the thoracic spineand bilateral sacoiliitis with fusion of the SI joints consistent with inflammatory arthritis.May be traumatic arthritis orankylosing spondylitis. -NSAID's PRN, F/uHLA-B27   Substance use disorder: Regularly uses heroin, crystal meth, and crack cocaine. Also gets daily methadone with ADS.  -Continue methadone60m daily - SW consulted for housing situation and substance use disorder - Bowel regimen to prevent constipation - given Hep B vaccine 5/8 (repeat in 6/8 month and 11/8)  Hepatitis C positive antibody: HCV antibody positive three months ago. Patient unaware of Hep C hx.  -F/uHCV RNA quant  Bipolar disorder/major depressive disorder:Stopped takingolanzapine and sertraline2 months ago as he did not pick up his medication. Resumed on admission. - Continueolanzapine 2.5 mg daily andsertraline 100 mg daily  Malnutrition Will intermittently monitor electrolytes for refeeding syndrome as patient has hx of recent weight loss and poor PO intake. - Ensure supplement  Metabolic Alkalosis: likely contraction, resolved with IVF - Encourage PO fluid intake  Dispo: Anticipated discharge post infection treatment   James Beard, DAndree Elk MD 01/14/2019, 6:18 AM Pager: [phone]

## 2019-01-14 NOTE — Progress Notes (Signed)
Pharmacy Antibiotic Note  James Beard. is a 33 y.o. male admitted on 01/07/2019 with MSSA septic arthritis.  Pharmacy has been consulted for Ancef dosing.  Today is day #7 of antibiotic therapy.  Renal function is stable.  Anticipate patient will remain inpatient for 6 weeks of IV antibiotic therapy.  Plan: Continue Ancef 2gm IV q8h. No further dose adjustments needed unless renal function changes.   Renal function will be monitored peripherally by [employer]. Rx will sign off.  Thank you for allowing pharmacy to be a part of this patient's care.  [employer], Pharm.D., BCPS Clinical Pharmacist Pager: [phone] Clinical phone for 01/14/2019 from 8:30-4:00 is [phone].  **Pharmacist phone directory can now be found on [email or web address] ([email or web address]).  Listed under [facility].  01/14/2019 2:44 PM
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
## 2019-01-14 NOTE — TOC Initial Note (Signed)
Transition of Care (TOC) - Initial/Assessment Note    Patient Details  Name: James Beard. MRN: [id number] Date of Birth: 06-02-1986  Transition of Care [facility] CM/SW Contact:    Epifanio Lesches, RN Phone Number: 01/14/2019, 7:30 PM  Clinical Narrative:          Admitted with MSSA bacteremia, hx of IV drug abuser. Homeless. States active with [facility] meth[facility]one clinic.       5/7 s/p  IRRIGATION AND DEBRIDEMENT OF ABSCESS RIGHT KNEE AND LEFT ANKLE   Pt will need 6 weeks of IV ABX therapy per ID.  Pt will remain inpatient , unable to SNF place 2/2 drug hx.  NCM will continue to monitor for TOC needs ....  Expected Discharge Plan: Home/Self Care Barriers to Discharge: Continued Medical Work up   Patient Goals and CMS Choice Patient states their goals for this hospitalization and ongoing recovery are:: to get help with housing, i'm homeless CMS Medicare.gov Compare Post Acute Care list provided to:: Patient    Expected Discharge Plan and Services Expected Discharge Plan: Home/Self Care In-house Referral: Clinical Social Work(homelessnes, substance abuse) Discharge Planning Services: CM Consult   Living arrangements for the past 2 months: Homeless                   DME Agency: NA       HH Arranged: NA HH Agency: NA        Prior Living Arrangements/Services Living arrangements for the past 2 months: Homeless Lives with:: Self Patient language and need for interpreter reviewed:: Yes Do you feel safe going back to the place where you live?: No(i'm homeless , i have no place to go)   I'm homeless  Need for Family Participation in Patient Care: No (Comment) Care giver support system in place?: No (comment)   Criminal Activity/Legal Involvement Pertinent to Current Situation/Hospitalization: No - Comment as needed  Activities of Daily Living Home Assistive Devices/Equipment: None ADL Screening (condition at time of admission) Patient's cognitive ability adequate  to safely complete daily activities?: Yes Is the patient deaf or have difficulty hearing?: No Does the patient have difficulty seeing, even when wearing glasses/contacts?: No Does the patient have difficulty concentrating, remembering, or making decisions?: Yes Patient able to express need for assistance with ADLs?: Yes Does the patient have difficulty dressing or bathing?: Yes Independently performs ADLs?: Yes (appropriate for developmental age) Does the patient have difficulty walking or climbing stairs?: Yes Weakness of Legs: Both Weakness of Arms/Hands: Left  Permission Sought/Granted Permission sought to share information with : Case Manager Permission granted to share information with : Yes, Verbal Permission Granted  Share Information with NAME: Teshawn Soucie (Father)           Emotional Assessment Appearance:: Appears stated age Attitude/Demeanor/Rapport: Engaged Affect (typically observed): Accepting Orientation: : Oriented to Self, Oriented to  Time, Oriented to Place, Oriented to Situation Alcohol / Substance Use: Illicit Drugs Psych Involvement: No (comment)  Admission diagnosis:  Ankle pain [M25.579] IV drug abuse (HCC) [F19.10] Pain and swelling of knee [M25.569, M25.469] Pyogenic arthritis of right knee joint, due to unspecified organism [facility]) [M00.9] Patient Active Problem List   Diagnosis Date Noted  . MSSA bacteremia 01/14/2019  . IV drug abuse (HCC)   . Tenosynovitis of left ankle 01/08/2019  . Paraspinal abscess (HCC) 01/08/2019  . Arthritis, septic, knee (HCC) 01/07/2019  . Opioid abuse with opioid-induced mood disorder (HCC) 10/02/2018  . Opioid use disorder (HCC) 09/29/2018  .  Substance induced mood disorder (HCC) 09/29/2018  . Major depressive disorder, recurrent severe without psychotic features (HCC) 09/29/2018   PCP:  Lavinia SharpsPlacey, Mary Ann, NP Pharmacy:   [facility] - [place], [place] - [address] [place]  [id number] Phone: [phone] Fax: [phone]  [facility] [place] [address] [place] [address] Phone: [phone] Fax: [phone]     Social Determinants of Health (SDOH) Interventions    Readmission Risk Interventions No flowsheet data found.

## 2019-01-14 NOTE — Progress Notes (Signed)
Physical Therapy Treatment and Discharge from Acute PT Patient Details Name: James Beard. MRN: [id number] DOB: August 18, 1986 Today's Date: 01/14/2019    History of Present Illness Pt is a 33 y/o male admitted secondary to worsening R knee pain. Found to have septic arthritis in R knee and septic tenosynovitis in L ankle. Pt is s/p I and D of R knee and L ankle.     PT Comments    Pt is able to mobilize at modified independent level at this time.  Pt reports ambulating one mile in hallway yesterday with IV pole.  Pt reports he is not likely to use RW upon d/c however would be more agreeable to use SPC.  Recommend SPC if able upon d/c.  Pt reports he can continue ambulating in acute setting and agreeable for PT to sign off at this time.   Follow Up Recommendations  (would benefit from OP PT f/u however, pt homeless)     Engineer, technical sales    Recommendations for Other Services       Precautions / Restrictions Precautions Precautions: Fall Required Braces or Orthoses: Other Brace Other Brace: L CAM walker boot Restrictions Other Position/Activity Restrictions: L CAM walker boot to be on during ambulation     Mobility  Bed Mobility Overal bed mobility: Modified Independent                Transfers Overall transfer level: Modified independent               General transfer comment: pt donned CAM walker sitting EOB  Ambulation/Gait Ambulation/Gait assistance: Modified independent (Device/Increase time) Gait Distance (Feet): 400 Feet Assistive device: IV Pole Gait Pattern/deviations: Step-through pattern;Decreased stride length     General Gait Details: pt appears to have less antalgic gait then previous PT sessions, pt declined using RW and pushed IV pole, good pace, no increase in pain   Stairs             Wheelchair Mobility    Modified Rankin (Stroke Patients Only)       Balance           Standing balance support: No  upper extremity supported Standing balance-Leahy Scale: Fair                              Cognition Arousal/Alertness: Awake/alert Behavior During Therapy: WFL for tasks assessed/performed Overall Cognitive Status: Within Functional Limits for tasks assessed                                        Exercises      General Comments        Pertinent Vitals/Pain Pain Assessment: 0-10 Pain Score: 2  Pain Location: L ankle, R knee  Pain Descriptors / Indicators: Aching Pain Intervention(s): Monitored during session;Premedicated before session    Home Living                      Prior Function            PT Goals (current goals can now be found in the care plan section) Progress towards PT goals: Goals met/education completed, patient discharged from PT    Frequency           PT Plan Other (comment)(met goals, d/c from PT)    Co-evaluation  AM-PAC PT "6 Clicks" Mobility   Outcome Measure  Help needed turning from your back to your side while in a flat bed without using bedrails?: None Help needed moving from lying on your back to sitting on the side of a flat bed without using bedrails?: None Help needed moving to and from a bed to a chair (including a wheelchair)?: None Help needed standing up from a chair using your arms (e.g., wheelchair or bedside chair)?: None Help needed to walk in hospital room?: None Help needed climbing 3-5 steps with a railing? : A Little 6 Click Score: 23    End of Session   Activity Tolerance: Patient tolerated treatment well Patient left: with call bell/phone within reach;in bed   PT Visit Diagnosis: Other abnormalities of gait and mobility (R26.89)     Time: 1012-1020 PT Time Calculation (min) (ACUTE ONLY): 8 min  Charges:  $Gait Training: 8-22 mins                    Carmelia Bake, PT, DPT Acute Rehabilitation Services Office: [phone] Pager:  [phone]  Trena Platt 01/14/2019, 1:21 PM

## 2019-01-15 DIAGNOSIS — Z95828 Presence of other vascular implants and grafts: Secondary | ICD-10-CM

## 2019-01-15 LAB — BASIC METABOLIC PANEL
Anion gap: 11 (ref 5–15)
BUN: 25 mg/dL — ABNORMAL HIGH (ref 6–20)
CO2: 28 mmol/L (ref 22–32)
Calcium: 9.2 mg/dL (ref 8.9–10.3)
Chloride: 96 mmol/L — ABNORMAL LOW (ref 98–111)
Creatinine, Ser: 0.56 mg/dL — ABNORMAL LOW (ref 0.61–1.24)
GFR calc Af Amer: >60 mL/min (ref >60)
GFR calc non Af Amer: >60 mL/min (ref >60)
Glucose, Bld: 104 mg/dL — ABNORMAL HIGH (ref 70–99)
Potassium: 4.4 mmol/L (ref 3.5–5.1)
Sodium: 135 mmol/L (ref 135–145)

## 2019-01-15 LAB — MAGNESIUM: Magnesium: 2.1 mg/dL (ref 1.7–2.4)

## 2019-01-15 LAB — HLA-B27 ANTIGEN: HLA-B27: NEGATIVE

## 2019-01-15 LAB — HCV RNA QUANT: HCV Quantitative: NOT DETECTED IU/mL (ref >50)

## 2019-01-15 LAB — PHOSPHORUS: Phosphorus: 4.7 mg/dL — ABNORMAL HIGH (ref 2.5–4.6)

## 2019-01-15 NOTE — Progress Notes (Signed)
Paged for patient having sudden onset lower extremity numbness below the knees bilaterally and discoloration to both feet. He has MSSA bacteremia and T10-T12 right paraspinal abscess and recently underwent left ankle and right knee washout on 5/5 for septic arthritis. Patient's nurse states he did not some slight discoloration when checking on patient earlier. On physical exam [name]' has strong pedal pulses bilaterally and extremities are warm and pink. He has strength 5/5 but does endorse bilateral lower extremity numbness up to the level of his knees. He does have pain in the left ankle from his recent surgery. He is able to ambulate normally without foot drop or LE weakness.   - neurovascularly intact, will continue to monitor

## 2019-01-15 NOTE — Progress Notes (Signed)
Subjective: No overnight events. Mr. Mastrangelo reports increased pain in his low back today. He tolerated breakfast well. PICC line was placed yesterday without issue. All questions addressed.   Objective:  Vital signs in last 24 hours: Vitals:   01/14/19 1424 01/14/19 2112 01/15/19 0518 01/15/19 0519  BP: 130/76 130/81 130/73   Pulse: 91 85 82   Resp: 16     Temp: 98.2 F (36.8 C) 98.6 F (37 C) 97.9 F (36.6 C)   TempSrc: Oral Oral    SpO2: 97% 98% 99%   Weight:    63.3 kg  Height:       Gen: alert and oriented, no distress CV: RRR, systolic murmur Pulm: CTAB, normal effort on room air Ext: no edema  Assessment/Plan:  Principal Problem:   MSSA bacteremia Active Problems:   Opioid use disorder (HCC)   Major depressive disorder, recurrent severe without psychotic features (HCC)   Arthritis, septic, knee (HCC)   Tenosynovitis of left ankle   Paraspinal abscess (HCC)   IV drug abuse [facility])  Mr. Whitworth an unhomed89 year old male with bipolar disorder, and MDD, and substance use disorder who presentedwithright knee and left ankle pain and swelling as well as lumbar spine pain in the setting of regular IV drug use.  MSSA bacteremia Right knee septic arthritis Left ankle septic tenosynovitis T10-T12 right paraspinal abscess Both blood and right knee synovial fluid culture grew MSSA. Underwent right knee and left ankle washout with ortho 5/5. Infection well-controlled on IV ancef, patient remains afebrilewithout leukocytosis. Neurosurgery does not recommend intervention for paraspinal abscess, and IR only drains lumbar abscesses. ID agrees with medical management of paraspinal abscess at this time. Will continue to monitor back pain and possibly re-image to ensure improvement in the future. Repeat blood cx from 5/6 with no growth to date. PICC in place. - ID following, appreciate recs  - Ortho following, appreciate recs -IV Ancef for 6 weeks -Pain control: PRN  toradol q6hrsand percocet 7.5-325 mg q6hrsprn -Continue working withPT/OT - CRP/ESR three weeks into abx therapy (around 5/25)  Inflammatory arthritis: Thoracic/lumbar MRI showed inflammatory arthritic changes of the costovertebral jointsthroughout the thoracic spineand bilateral sacoiliitis with fusion of the SI joints consistent with inflammatory arthritis.May be traumatic arthritis orankylosing spondylitis. HLA-B27 negative. -NSAIDs prn  Substance use disorder: Regularly uses heroin, crystal meth, and crack cocaine. Also gets daily methadone with [facility].  -Continue methadone93m daily - SW following for housing situation and substance use disorder - Bowel regimen to prevent constipation - given Hep B vaccine5/8(repeat in 6/8 month and 11/8)  Hepatitis C positive antibody: HCV antibody positive three months ago. Patient unaware of Hep C hx.  -F/uHCV RNA quant  Bipolar disorder/major depressive disorder:Stopped takingolanzapine and sertraline2 months ago as he did not pick up his medication. Resumed on admission. - Continueolanzapine 2.5 mg daily andsertraline 100 mg daily  Malnutrition Will intermittently monitor electrolytes for refeeding syndrome as patient has hx of recent weight loss and poor PO intake. Normal K, Mag, and phos today. - Ensure supplement  Dispo: Anticipated discharge after 6 weeks of inpatient IV abx therapy.   , DAndree Elk MD 01/15/2019, 6:32 AM Pager: [phone]

## 2019-01-15 NOTE — Progress Notes (Signed)
Patient has scattered scabs all over his body.  Patient is picking the scabs to the point its opening and bleeding.  Educated the patient the importance of having good hygiene to avoid infecting those areas and to refrain from picking at his skin.  Will continue to monitor the patient and notify as needed

## 2019-01-15 NOTE — Progress Notes (Signed)
Nutrition Follow-up  RD working remotely.  DOCUMENTATION CODES:   Not applicable  INTERVENTION:   -Continue Ensure Enlive po TID, each supplement provides 350 kcal and 20 grams of protein -Continue 30 ml Prostat BID, each supplement provides 100 kcals and 15 grams protein -Continue MVI with minerals daily  NUTRITION DIAGNOSIS:   Increased nutrient needs related to post-op healing as evidenced by estimated needs.  Ongoing  GOAL:   Patient will meet greater than or equal to 90% of their needs  Progressing  MONITOR:   PO intake, Supplement acceptance, Weight trends, Diet advancement, Labs  REASON FOR ASSESSMENT:   Malnutrition Screening Tool    ASSESSMENT:   33 year old male who presented to the ED on 5/04 with knee pain. PMH of bipolar disorder, homelessness, and IV drug use. Arthrocentesis of right knee performed at bedside in the ED. Pt found to have septic arthritis of right knee, tenosynovitis of left ankle, and paraspinal abscess.  5/5 - s/p I&D of right knee and debridement of peroneal tendons and deep abscess of left ankle 5/11- PICC placed  Reviewed I/O's: -762 ml x 24 hours and -2.7 L since admission  UOP: 1 L x 24 hours  Per MD notes, expect prolonged hospitalization secondary to need for antibiotics and history of IV drug use. Pt will require 6 weeks of IV antibiotics.   Pt with good appetite; noted meal completion 90-100%. Pt taking Prostat, Ensure, and MVI as ordered per MVI.   Pt at high risk of malnutrition due to significant weight loss, homelessness, and history of IV drug use. However, unable to identify without further history and completion of nutrition-focused physical exam.   Labs reviewed: Phos: 5.2.   Diet Order:   Diet Order            Diet regular Room service appropriate? Yes; Fluid consistency: Thin  Diet effective now              EDUCATION NEEDS:   Not appropriate for education at this time  Skin:  Skin Assessment: Skin  Integrity Issues: Skin Integrity Issues:: Incisions Incisions: closed incisions to right knee, left ankle  Last BM:  01/13/19  Height:   Ht Readings from Last 1 Encounters:  01/07/19 5' 9.02" (1.753 m)    Weight:   Wt Readings from Last 1 Encounters:  01/15/19 63.3 kg    Ideal Body Weight:  72.8 kg  BMI:  Body mass index is 20.61 kg/m.  Estimated Nutritional Needs:   Kcal:  2000-2200  Protein:  100-115 grams  Fluid:  2.0-2.2 L    Marizol Borror A. Mayford Knife, RD, LDN, CDCES Registered Dietitian II Certified Diabetes Care and Education Specialist Pager: [phone] After hours Pager: [phone]

## 2019-01-16 ENCOUNTER — Inpatient Hospital Stay: Payer: Self-pay

## 2019-01-16 DIAGNOSIS — E43 Unspecified severe protein-calorie malnutrition: Secondary | ICD-10-CM

## 2019-01-16 MED ORDER — LIDOCAINE 5 % EX PTCH
1.0000 | MEDICATED_PATCH | Freq: Every day | CUTANEOUS | Status: DC | PRN
  Administered 2019-01-16 – 2019-01-25 (×3): 1 via TRANSDERMAL
  Filled 2019-01-16 (×3): qty 1

## 2019-01-16 NOTE — Progress Notes (Signed)
Subjective: James Beard developed lower extremity numbness in the bilateral lower extremities below the knees as well as discolored feet yesterday evening. The call team evaluated him and found him to be neurovascularly intact without focal neurological deficits. This morning, the patient reports that his symptoms quickly resolved. He was sitting in the chair when the pins and needles sensation started. No difficulties walking. No increased back pain. No other concerns this morning. He has been moving his bowels regularly. Patient was provided with cross word puzzles for entertainment. All questions addressed.   Objective:  Vital signs in last 24 hours: Vitals:   01/15/19 0518 01/15/19 0519 01/15/19 2156 01/16/19 0623  BP: 130/73  126/65 111/78  Pulse: 82  87 64  Resp:   17 17  Temp: 97.9 F (36.6 C)  98.1 F (36.7 C) 97.6 F (36.4 C)  TempSrc:   Oral Oral  SpO2: 99%  99% 100%  Weight:  63.3 kg    Height:       Gen: alert and oriented, no distress CV: RRR, no murmurs Abd: bs+, soft, non-distended, non-tender Ext: Clean wraps in place on right knee and left ankle. 5/5 strength in both feet. Cap refill <2 seconds in toes. Normal sensation on both feet.   Assessment/Plan:  Principal Problem:   MSSA bacteremia Active Problems:   Opioid use disorder ([place])   Major depressive disorder, recurrent severe without psychotic features (HCC)   Arthritis, septic, knee (HCC)   Tenosynovitis of left ankle   Paraspinal abscess ([place])   IV drug abuse [facility])  James Beard an unhomed87 year old male with bipolar disorder, and MDD, and substance use disorder who presentedwithright knee and left ankle pain and swelling as well as lumbar spine pain in the setting of regular IV drug use.  MSSA bacteremia Right knee septic arthritis Left ankle septic tenosynovitis T10-T12 right paraspinal abscess Both blood and right knee synovial fluid culturegrew MSSA.Underwent right knee and left  ankle washout with ortho 5/5.Infection well-controlled on IV ancef, patient remains afebrilewithout leukocytosis. Neurosurgery does not recommend intervention for paraspinal abscess, and IR only drains lumbar abscesses. ID agrees with medical managementof paraspinal abscessat this time. Will continue to monitor back pain and possibly re-image to ensure improvement in the future. Repeat blood cx from 5/6 with no growth to date. PICC in place. BLE numbness/tingling has resolved. No suspicion of paraspinal abscess cord compression, nutritional deficiency, or post-op neurovascular compromise.  - ID following, appreciate recs  - Ortho following, appreciate recs -IV Ancef for 6 weeks -Pain control: PRN toradol q6hrsand percocet 7.5-325 mg q6hrsprn -Continue working withPT/OT - CRP/ESR three weeks into abx therapy (around 5/25)  Inflammatory arthritis: Thoracic/lumbar MRI showed inflammatory arthritic changes of the costovertebral jointsthroughout the thoracic spineand bilateral sacoiliitis with fusion of the SI joints consistent with inflammatory arthritis.May be traumatic arthritis orankylosing spondylitis. HLA-B27 negative. -NSAIDs prn  Substance use disorder: Regularly uses heroin, crystal meth, and crack cocaine. Also gets daily methadone with [facility].  -Continue methadone71m daily - SW following for housing situation and substance use disorder - Bowel regimen to prevent constipation - given Hep B vaccine5/8(repeat in6/886monthnd 11/8)  Hepatitis C positive antibody: HCV antibody positive three months ago, RNA quant undetectable. No further intervention.  Bipolar disorder/major depressive disorder:Stopped takingolanzapine and sertraline2 months ago as he did not pick up his medication. Resumed on admission. - Continueolanzapine 2.5 mg daily andsertraline 100 mg daily  Malnutrition Willintermittentlymonitor electrolytes for refeeding syndrome as patient has hx of  recent weight loss  and poor PO intake. - Ensure supplement  Dispo: Anticipated discharge after 6 weeks of inpatient IV abx therapy.   James Beard, James Elk, MD 01/16/2019, 6:35 AM Pager: [phone]

## 2019-01-17 MED ORDER — OXYCODONE HCL 5 MG PO TABS
5.0000 mg | ORAL_TABLET | Freq: Four times a day (QID) | ORAL | Status: DC | PRN
  Administered 2019-01-17 – 2019-01-31 (×31): 5 mg via ORAL
  Filled 2019-01-17 (×32): qty 1

## 2019-01-17 MED ORDER — IBUPROFEN 600 MG PO TABS
600.0000 mg | ORAL_TABLET | Freq: Four times a day (QID) | ORAL | Status: DC | PRN
  Administered 2019-01-17 – 2019-01-27 (×3): 600 mg via ORAL
  Filled 2019-01-17 (×3): qty 1

## 2019-01-17 NOTE — Progress Notes (Signed)
   Subjective: No overnight events. Pain is manageable. No further numbness/tingling in his lower extremities. No acute concerns today.   Objective:  Vital signs in last 24 hours: Vitals:   01/15/19 2156 01/16/19 0623 01/16/19 1305 01/17/19 0603  BP: 126/65 111/78 130/88 101/72  Pulse: 87 64 91 62  Resp: '17 17 20 18  '$ Temp: 98.1 F (36.7 C) 97.6 F (36.4 C) 98.1 F (36.7 C) 97.9 F (36.6 C)  TempSrc: Oral Oral Oral   SpO2: 99% 100% 96% 97%  Weight:      Height:       Gen: alert and oriented, no distress CV: RRR, systolic murmur Pulm: CTA in the anterior lung fields, normal effort on room air Ext: Clean wrap on right knee and left ankle. BLE neurovascularly intact.   Assessment/Plan:  Principal Problem:   MSSA bacteremia Active Problems:   Opioid use disorder ([place])   Major depressive disorder, recurrent severe without psychotic features (HCC)   Arthritis, septic, knee (HCC)   Tenosynovitis of left ankle   Paraspinal abscess ([place])   IV drug abuse [facility])  James Beard an unhoused 33 year old male who injects drugs and has bipolar disorder and MDD who presented with right knee septic arthritis, left ankle septic tenosynovitis, and a thoracic paraspinal abscess in the setting MSSA bacteremia. He underwent right knee and left ankle washout 5/5. Infection and pain are currently well-controlled. He will remain in the hospital for a 6-week course of IV antibiotic therapy (started 5/4).  MSSA bacteremia Right knee septic arthritis Left ankle septic tenosynovitis T10-T12 right paraspinal abscess Neurosurgery does not recommend intervention for paraspinal abscess, and IR only drains lumbar abscesses. ID agrees with medical managementof paraspinal abscessat this time. Will continue to monitor back pain and possibly re-image to ensure improvement in the future.  - ID following, appreciate recs  - Ortho following, appreciate recs -IV Ancef for 6 weeks -Pain control: tylenol,  ibuprofen, and oxycodone '5mg'$  q6hrsprn. Working towards weaning off the -Continue working withPT/OT - Weekly CBC, BMP - CRP/ESR three weeks into abx therapy (around 5/25)  Inflammatory arthritis: Thoracic/lumbar MRI showed inflammatory arthritic changes of the costovertebral jointsthroughout the thoracic spineand bilateral sacoiliitis with fusion of the SI joints consistent with inflammatory arthritis.May be traumatic arthritis. HLA-B27 negative. -NSAIDs prn  Substance use disorder: Regularly uses heroin, crystal meth, and crack cocaine. Also gets daily methadone with [facility].  -Continue methadone'69mg'$  daily - SWfollowingfor housing situation and substance use disorder - Bowel regimen to prevent constipation - given Hep B vaccine5/8(repeat in6/4monthand 11/8)  Bipolar disorder/major depressive disorder:Stopped takingolanzapine and sertraline2 months prior to admission as he did not pick up his medication. Resumed on admission. - Continueolanzapine 2.5 mg daily andsertraline 100 mg daily  Dispo: Anticipated dischargeafter 6 weeks of inpatient IV abx therapy.  Vola Beneke, DAndree Elk MD 01/17/2019, 6:45 AM Pager: [phone]

## 2019-01-18 DIAGNOSIS — F332 Major depressive disorder, recurrent severe without psychotic features: Secondary | ICD-10-CM

## 2019-01-18 DIAGNOSIS — M65172 Other infective (teno)synovitis, left ankle and foot: Secondary | ICD-10-CM

## 2019-01-18 LAB — RENAL FUNCTION PANEL
Albumin: 2.7 g/dL — ABNORMAL LOW (ref 3.5–5.0)
Anion gap: 7 (ref 5–15)
BUN: 21 mg/dL — ABNORMAL HIGH (ref 6–20)
CO2: 32 mmol/L (ref 22–32)
Calcium: 9 mg/dL (ref 8.9–10.3)
Chloride: 98 mmol/L (ref 98–111)
Creatinine, Ser: 0.59 mg/dL — ABNORMAL LOW (ref 0.61–1.24)
GFR calc Af Amer: >60 mL/min (ref >60)
GFR calc non Af Amer: >60 mL/min (ref >60)
Glucose, Bld: 96 mg/dL (ref 70–99)
Phosphorus: 4.8 mg/dL — ABNORMAL HIGH (ref 2.5–4.6)
Potassium: 4.1 mmol/L (ref 3.5–5.1)
Sodium: 137 mmol/L (ref 135–145)

## 2019-01-18 MED ORDER — TRIAMCINOLONE 0.1 % CREAM:EUCERIN CREAM 1:1
TOPICAL_CREAM | Freq: Three times a day (TID) | CUTANEOUS | Status: DC | PRN
  Administered 2019-01-18: 14:00:00 via TOPICAL
  Filled 2019-01-18: qty 1

## 2019-01-18 NOTE — Progress Notes (Signed)
   Subjective: No acute events overnight. Patient's pain is well controlled. Endorses good appetite. No nausea or vomiting. Having regular bowel movements. He has been ambulating the halls without difficulty.   Objective:  Vital signs in last 24 hours: Vitals:   01/17/19 1324 01/17/19 2127 01/18/19 0540 01/18/19 0541  BP: 124/71 113/69 114/69   Pulse: 81 85 70   Resp: 16     Temp: 97.6 F (36.4 C) 98.5 F (36.9 C) 98.6 F (37 C)   TempSrc:      SpO2: 98% 98% 98%   Weight:    63 kg  Height:       General: awake, alert, lying in bed in NAD CV: RRR; SEM  Pulm: normal work of breathing on room air; lungs CTAB Skin: excoriations on abdomen and extremities Ext: left ankle and right knee wrapped. BLE neurovascularly intact  Assessment/Plan:  Principal Problem:   MSSA bacteremia Active Problems:   Opioid use disorder ([place])   Major depressive disorder, recurrent severe without psychotic features (HCC)   Arthritis, septic, knee (HCC)   Tenosynovitis of left ankle   Paraspinal abscess ([place])   IV drug abuse [facility])  Mr. Loken an unhoused 33 year old male who injects drugs and has bipolar disorder and MDD who presented with right knee septic arthritis, left ankle septic tenosynovitis, and a thoracic paraspinal abscess in the setting MSSA bacteremia. He underwent right knee and left ankle washout 5/5. Infection and pain are currently well-controlled. He will remain in the hospital for a 6-week course of IV antibiotic therapy (started 5/4).  MSSA bacteremia Right knee septic arthritis Left ankle septic tenosynovitis T10-T12 right paraspinal abscess Neurosurgery does not recommend intervention for paraspinal abscess, and IR only drains lumbar abscesses. ID agrees with medical managementof paraspinal abscessat this time. Will continue to monitor back pain and possibly re-image to ensure improvement in the future.  -continue IV Ancef for 6 weeks -Pain control: tylenol,  ibuprofen, and oxycodone '5mg'$  q6hrsprn; will continue to work towards weaning Oxy  -ambulating halls without difficulty  - Weekly CBC, BMP - will check CRP/ESR three weeks into abx therapy (around 5/25)  Inflammatory arthritis: Thoracic/lumbar MRI showed inflammatory arthritic changes of the costovertebral jointsthroughout the thoracic spineand bilateral sacoiliitis with fusion of the SI joints consistent with inflammatory arthritis.May be traumatic arthritis. HLA-B27 negative. -NSAIDs prn  Substance use disorder: Regularly uses heroin, crystal meth, and crack cocaine. Also gets daily methadone with ADS.  -Continue methadone'69mg'$  daily - SWfollowingfor housing situation and substance use disorder - Bowel regimen to prevent constipation - given Hep B vaccine5/8(repeat in6/63monthand 11/8)  Bipolar disorder/major depressive disorder: - stable on olanzapine 2.5 mg daily andsertraline 100 mg daily  Dispo: Anticipated discharge after completion of IV abx therapy (02/18/19)  BModena NunneryD, DO 01/18/2019, 6:54 AM Pager: [phone]

## 2019-01-18 NOTE — Plan of Care (Signed)
  Problem: Education: Goal: Knowledge of General Education information will improve Description: Including pain rating scale, medication(s)/side effects and non-pharmacologic comfort measures Outcome: Progressing   Problem: Clinical Measurements: Goal: Ability to maintain clinical measurements within normal limits will improve Outcome: Progressing   

## 2019-01-19 NOTE — Progress Notes (Signed)
   Subjective: Mr. Gorelik noted that his PICC line and dressing became loose overnight. Otherwise, he is feeling well without complaints. He is still ambulating well without difficulty. Endorses good PO intake and regular BMs.   Objective:  Vital signs in last 24 hours: Vitals:   01/18/19 1402 01/18/19 2113 01/19/19 0526 01/19/19 0526  BP: 129/81 119/76  131/77  Pulse: 79 82  89  Resp: 18     Temp: 98.3 F (36.8 C) 99.1 F (37.3 C)  98.6 F (37 C)  TempSrc: Oral Oral  Oral  SpO2: 99% 99%  98%  Weight:   66.8 kg   Height:       General: awake, alert, lying comfortably in bed CV: RRR Ext: PICC in RUE appears displaced   Assessment/Plan:  Principal Problem:   MSSA bacteremia Active Problems:   Opioid use disorder (HCC)   Major depressive disorder, recurrent severe without psychotic features (HCC)   Arthritis, septic, knee (HCC)   Tenosynovitis of left ankle   Paraspinal abscess (HCC)   IV drug abuse [facility])  Mr. Moltz an unhoused82 year old malewho injects drugs and has bipolar disorder and MDD who presented with right knee septic arthritis, left ankle septic tenosynovitis, and a thoracic paraspinal abscess in the setting MSSA bacteremia. He underwent right knee and left ankle washout 5/5. Infection and pain are currently well-controlled. He will remain in the hospital for a 6-week course of IV antibiotic therapy (started 5/4).  MSSA bacteremia Right knee septic arthritis Left ankle septic tenosynovitis T10-T12 right paraspinal abscess Neurosurgery does not recommend intervention for paraspinal abscess, and IR only drains lumbar abscesses. ID agrees with medical managementof paraspinal abscessat this time. Will continue to monitor back pain and possibly re-image to ensure improvement in the future.  -continue IV Ancef for 6 weeks; will likely need new PICC today  -only requiring PRN Oxy twice daily  -ambulating halls without difficulty  - Weekly CBC, BMP -  will check CRP/ESR three weeks into abx therapy (around 5/25)  Inflammatory arthritis: Thoracic/lumbar MRI showed inflammatory arthritic changes of the costovertebral jointsthroughout the thoracic spineand bilateral sacoiliitis with fusion of the SI joints consistent with inflammatory arthritis.May be traumatic arthritis.HLA-B27 negative. -NSAIDs prn  Substance use disorder: Regularly uses heroin, crystal meth, and crack cocaine. Also gets daily methadone with ADS.  -Continue methadone'70mg'$  daily - SWfollowingfor housing situation and substance use disorder - Bowel regimen to prevent constipation - given Hep B vaccine5/8(repeat in6/18monthand 11/8)  Bipolar disorder/major depressive disorder: - stable on olanzapine 2.5 mg daily andsertraline 100 mg daily  Dispo: Anticipated discharge after completion of IV abx (6/15)  BModena NunneryD, DO 01/19/2019, 6:43 AM Pager: [phone]

## 2019-01-20 NOTE — Progress Notes (Addendum)
Subjective: He reports they fixed his picc line last night.  This morning the tape is coming off again but not dislodged like yesterday.  No new issues, ambulating well, good pain control.    Objective:  Vital signs in last 24 hours: Vitals:   01/19/19 1415 01/19/19 2118 01/20/19 0458 01/20/19 0601  BP: 117/78 110/66 117/74   Pulse: 94 88 88   Resp: _0 Temp: 98.5 F (36.9 C) 98.1 F (36.7 C) 98 F (36.7 C)   TempSrc: Oral Oral Oral   SpO2: 97% 96% 97%   Weight:    66.3 kg  Height:       Cardiac: normal rate and rhythm, clear s1 and s2, no murmurs, rubs or gallops Pulmonary: CTAB, not in distress Abdominal: non distended abdomen, soft and nontender Surgical sites right knee and left ankle: wrapped in ace, no drainage good range of motion in both joints.   Psych: Alert, conversant, in good spirits   Assessment/Plan:  Principal Problem:   MSSA bacteremia Active Problems:   Opioid use disorder ([place])   Major depressive disorder, recurrent severe without psychotic features (HCC)   Arthritis, septic, knee (HCC)   Tenosynovitis of left ankle   Paraspinal abscess ([place])   IV drug abuse [facility])  James Beard an unhoused1 year old malewho injects drugs and has bipolar disorder and MDD who presented with right knee septic arthritis, left ankle septic tenosynovitis, and a thoracic paraspinal abscess in the setting MSSA bacteremia. He underwent right knee and left ankle washout 5/5. Infection and pain are currently well-controlled. He will remain in the hospital for a 6-week course of IV antibiotic therapy (started 5/4).  MSSA bacteremia Right knee septic arthritis Left ankle septic tenosynovitis T10-T12 right paraspinal abscess Neurosurgery does not recommend intervention for paraspinal abscess, and IR only drains lumbar abscesses. ID agrees with medical managementof paraspinal abscessat this time. Will continue to monitor back pain and possibly re-image to ensure  improvement in the future.  -continue IV Ancef for 6 weeks; appreciate IV team's assistance with picc line  -only requiring PRN Oxy twice daily  -ambulating halls without difficulty  - Weekly CBC, BMP - will check CRP/ESR three weeks into abx therapy (around 5/25)  Inflammatory arthritis: Thoracic/lumbar MRI showed inflammatory arthritic changes of the costovertebral jointsthroughout the thoracic spineand bilateral sacoiliitis with fusion of the SI joints consistent with inflammatory arthritis.May be traumatic arthritis.HLA-B27 negative. -NSAIDs prn  Substance use disorder: Regularly uses heroin, crystal meth, and crack cocaine. Also gets daily methadone with ADS.  -Continue methadone14m daily - SWfollowingfor housing situation and substance use disorder - Bowel regimen to prevent constipation - given Hep B vaccine5/8(repeat in6/860monthnd 11/8)  Bipolar disorder/major depressive disorder: - stable on olanzapine 2.5 mg daily andsertraline 100 mg daily  Dispo: Anticipated discharge after completion of IV abx (6/15)  WiKatherine RoanMD 01/20/2019, 9:47 AM

## 2019-01-21 DIAGNOSIS — M00062 Staphylococcal arthritis, left knee: Secondary | ICD-10-CM

## 2019-01-21 DIAGNOSIS — R21 Rash and other nonspecific skin eruption: Secondary | ICD-10-CM

## 2019-01-21 DIAGNOSIS — M659 Synovitis and tenosynovitis, unspecified: Secondary | ICD-10-CM

## 2019-01-21 NOTE — Progress Notes (Signed)
   Subjective: Feeling well this morning. Denies any pain. Ambulating well. Had PICC replaced.   Objective:  Vital signs in last 24 hours: Vitals:   01/20/19 0601 01/20/19 2119 01/21/19 0523 01/21/19 0603  BP:  129/80 114/68   Pulse:  87 85   Resp:  18 18   Temp:  99.5 F (37.5 C) 98.1 F (36.7 C)   TempSrc:  Oral Oral   SpO2:  98% 98%   Weight: 66.3 kg   66.2 kg  Height:       General: awake, alert, lying comfortably in bed in NAD Ext: good ROM at right knee Psych: appropriate mood and affect   Assessment/Plan:  Principal Problem:   MSSA bacteremia Active Problems:   Opioid use disorder (HCC)   Major depressive disorder, recurrent severe without psychotic features (HCC)   Arthritis, septic, knee (HCC)   Tenosynovitis of left ankle   Paraspinal abscess (HCC)   IV drug abuse [facility])  [employer]ard an unhoused15 year old malewho injects drugs and has bipolar disorder and MDD who presented with right knee septic arthritis, left ankle septic tenosynovitis, and a thoracic paraspinal abscess in the setting MSSA bacteremia. He underwent right knee and left ankle washout 5/5. Infection and pain are currently well-controlled. He will remain in the hospital for a 6-week course of IV antibiotic therapy (started 5/4).  MSSA bacteremia Right knee septic arthritis Left ankle septic tenosynovitis T10-T12 right paraspinal abscess Neurosurgery does not recommend intervention for paraspinal abscess, and IR only drains lumbar abscesses. ID agrees with medical managementof paraspinal abscessat this time. Will continue to monitor back pain and possibly re-image to ensure improvement in the future.  -continueIV Ancef for 6 weeks; appreciate IV team's assistance with picc line  -only requiring PRN Oxy 1-2x daily  -ambulating halls without difficulty - Weekly CBC, BMP -will checkCRP/ESR three weeks into abx therapy (around 5/25)  Inflammatory arthritis: Thoracic/lumbar MRI  showed inflammatory arthritic changes of the costovertebral jointsthroughout the thoracic spineand bilateral sacoiliitis with fusion of the SI joints consistent with inflammatory arthritis.May be traumatic arthritis.HLA-B27 negative. -NSAIDs prn  Substance use disorder: Regularly uses heroin, crystal meth, and crack cocaine. Also gets daily methadone with ADS.  -Continue methadone'70mg'$  daily - SWfollowingfor housing situation and substance use disorder - Bowel regimen to prevent constipation - given Hep B vaccine5/8(repeat in6/32monthand 11/8)  Bipolar disorder/major depressive disorder: -stable onolanzapine 2.5 mg daily andsertraline 100 mg daily  Dispo: Anticipated discharge after completion of IV antibiotics (6/15).  BModena NunneryD, DO 01/21/2019, 7:13 AM Pager: [phone]

## 2019-01-21 NOTE — Plan of Care (Signed)

## 2019-01-21 NOTE — Progress Notes (Addendum)
Attending attestation: I saw and examined Mr. Bruschi and discussed his care with Janene Madeira, NP.  I am in agreement with her assessment and plans.  Mr. Wynn is improving slowly on therapy for MSSA bacteremia complicated by thoracic spine infection left knee and left ankle infection.  We recommend continuing cefazolin for 6 weeks total through June 17.  Please call us if we can be of further assistance while he is here.  Michel Bickers, MD [place] for [facility] [phone] pager   [phone] cell 01/21/2019, 3:16 PM    [place] for Infectious Disease  Date of Admission:  01/07/2019      Total days of antibiotics 15  Day 14 cefazolin           ASSESSMENT: Diarra is doing well now on treatment day 12/42 with cefazolin for disseminated MSSA infection including paraspinal thoracic spine abscess, left ankle septic tenosynovitis, left knee septic arthritis and associated bacteremia. His transthoracic echo revealed no vegetations and no compromise in valve function/structure.    End date for IV antibiotics June 17th. Would check weekly CBC and BMP for monitoring on therapy as well as CRP and ESR at 3 weeks and prior to discharge.   His hepatitis C rna returned undetectable. He will not need treatment outpatient as it appears he has naturally cleared infection. Harm reduction discussed today and cautioned that he is not protected and can become re-infected. He assures me that he is done with injection drug use after this.    PLAN: 1. Continue cefazolin through June 17 to complete 6 weeks 2. Labs as detailed above  3. Would consider repeating his MRI at the end of therapy to evaluate paraspinal abscess for consideration or oral therapy after induction.    Principal Problem:   MSSA bacteremia Active Problems:   Arthritis, septic, knee (HCC)   Tenosynovitis of left ankle   Paraspinal abscess (HCC)   Opioid use disorder (HCC)  Major depressive disorder, recurrent severe without psychotic features ([place])   IV drug abuse ([place])   . enoxaparin (LOVENOX) injection  40 mg Subcutaneous Q24H  . feeding supplement (ENSURE ENLIVE)  237 mL Oral TID BM  . feeding supplement (PRO-STAT SUGAR FREE 64)  30 mL Oral BID  . methadone  70 mg Oral Daily  . multivitamin with minerals  1 tablet Oral Daily  . nicotine  21 mg Transdermal Daily  . OLANZapine  2.5 mg Oral QHS  . senna-docusate  1 tablet Oral QHS  . sertraline  50 mg Oral Daily    SUBJECTIVE: Up walking the halls and returned to the room with [place] to talk. He is doing well. R knee is about 85% but still a little stiff. Left ankle still in walking boot but getting better. He has some ongoing back pain but nothing worsened. No fevers/chills. Eating well. Has several skin ulcerations that were there prior to admission.   Review of Systems: Review of Systems  Constitutional: Negative for chills, diaphoresis and fever.  Respiratory: Negative for cough.   Cardiovascular: Negative for chest pain and leg swelling.  Gastrointestinal: Negative for abdominal pain, diarrhea and nausea.  Musculoskeletal: Positive for back pain.  Skin: Positive for rash (ulcerations scattered randomly at right knee, arms.).  Neurological: Negative for weakness and headaches.    No Known Allergies  OBJECTIVE: Vitals:   01/20/19 0601 01/20/19 2119 01/21/19 0523 01/21/19 0603  BP:  129/80 114/68   Pulse:  87  85   Resp:  18 18   Temp:  99.5 F (37.5 C) 98.1 F (36.7 C)   TempSrc:  Oral Oral   SpO2:  98% 98%   Weight: 66.3 kg   66.2 kg  Height:       Body mass index is 21.54 kg/m.  Physical Exam Constitutional:      Comments: Thin appearing. Up walking the halls frequently comfortably. Pleasant.   Eyes:     General: No scleral icterus.    Pupils: Pupils are equal, round, and reactive to light.  Cardiovascular:     Rate and Rhythm: Normal rate and regular rhythm.     Heart sounds: No  murmur.  Pulmonary:     Effort: Pulmonary effort is normal.     Breath sounds: Normal breath sounds.  Abdominal:     General: Bowel sounds are normal. There is no distension.     Palpations: Abdomen is soft.  Musculoskeletal:     Comments: Right knee with incision well approximated and staples intact. Ulcerated area above the right knee he says is from bug bites. Clean wound bed, non-purulent.  L ankle in CAM boot. Spine with kyphotic curvature but nothing otherwise remarkable.   Neurological:     Mental Status: He is alert.   PICC line - clean/dry dressing. Insertion site w/o erythema, tenderness, drainage, cording or distal swelling of affected extremity    Lab Results Lab Results  Component Value Date   WBC 9.3 01/13/2019   HGB 11.4 (L) 01/13/2019   HCT 36.1 (L) 01/13/2019   MCV 90.5 01/13/2019   PLT 439 (H) 01/13/2019    Lab Results  Component Value Date   CREATININE 0.59 (L) 01/18/2019   BUN 21 (H) 01/18/2019   NA 137 01/18/2019   K 4.1 01/18/2019   CL 98 01/18/2019   CO2 32 01/18/2019    Lab Results  Component Value Date   ALT 35 01/07/2019   AST 22 01/07/2019   ALKPHOS 196 (H) 01/07/2019   BILITOT 0.6 01/07/2019     Microbiology: No results found for this or any previous visit (from the past 240 hour(s)).  Janene Madeira, MSN, NP-C [facility] for Infectious [place] Cell: [phone] Pager: [phone]  01/21/2019  1:43 PM

## 2019-01-22 MED ORDER — POLYETHYLENE GLYCOL 3350 17 G PO PACK
17.0000 g | PACK | Freq: Every day | ORAL | Status: DC
  Administered 2019-01-22: 13:00:00 17 g via ORAL
  Filled 2019-01-22 (×8): qty 1

## 2019-01-22 NOTE — Progress Notes (Signed)
Nutrition Follow-up  RD working remotely.  DOCUMENTATION CODES:   Not applicable  INTERVENTION:   -Continue MVI with minerals daily -Continue 30 ml Prostat BID, each supplement provides 100 kcals and 15 grams protein -Continue Ensure Enlive po TID, each supplement provides 350 kcal and 20 grams of protein  NUTRITION DIAGNOSIS:   Increased nutrient needs related to post-op healing as evidenced by estimated needs.  Ongoing  GOAL:   Patient will meet greater than or equal to 90% of their needs  Progressing  MONITOR:   PO intake, Supplement acceptance, Weight trends, Diet advancement, Labs  REASON FOR ASSESSMENT:   Malnutrition Screening Tool    ASSESSMENT:   33 year old male who presented to the ED on 5/04 with knee pain. PMH of bipolar disorder, homelessness, and IV drug use. Arthrocentesis of right knee performed at bedside in the ED. Pt found to have septic arthritis of right knee, tenosynovitis of left ankle, and paraspinal abscess.  5/5 - s/p I&D of right knee and debridement of peroneal tendons and deep abscess of left ankle 5/11- PICC placed  Reviewed I/O's: -1.2 L x 24 hours and -3.4 L since admission  UOP: 2.7 L x 24 hours  Per MD notes, expect prolonged hospitalization secondary to need for antibiotics and history of IV drug use. Pt will require 6 weeks of IV antibiotics (anticipate discharge 02/20/19).   Pt with good appetite; noted meal completion 90-100%. Pt taking Prostat, Ensure, and MVI as ordered per MVI.  Pt at high risk of malnutrition due to significant weight loss, homelessness, and history of IV drug use. However, unable to identify without further history and completion of nutrition-focused physical exam.   Labs reviewed: Phos: 4.7.   Diet Order:   Diet Order            Diet regular Room service appropriate? Yes; Fluid consistency: Thin  Diet effective now              EDUCATION NEEDS:   Not appropriate for education at this  time  Skin:  Skin Assessment: Skin Integrity Issues: Skin Integrity Issues:: Incisions Incisions: closed incisions to right knee, left ankle  Last BM:  01/21/19  Height:   Ht Readings from Last 1 Encounters:  01/07/19 5' 9.02" (1.753 m)    Weight:   Wt Readings from Last 1 Encounters:  01/22/19 67.4 kg    Ideal Body Weight:  72.8 kg  BMI:  Body mass index is 21.93 kg/m.  Estimated Nutritional Needs:   Kcal:  2000-2200  Protein:  100-115 grams  Fluid:  2.0-2.2 L    Rosaly Labarbera A. Mayford Knife, RD, LDN, CDCES Registered Dietitian II Certified Diabetes Care and Education Specialist Pager: [phone] After hours Pager: [phone]

## 2019-01-22 NOTE — Progress Notes (Signed)
   Subjective: No acute events overnight. He endorses some mild back pain which is relieved fairly well with Oxy IR. Also endorses some mild constipation.   Objective:  Vital signs in last 24 hours: Vitals:   01/21/19 1458 01/21/19 2126 01/22/19 0448 01/22/19 0647  BP: 128/79 121/79  106/66  Pulse: 83 83  73  Resp:      Temp: 98.4 F (36.9 C) 98.3 F (36.8 C)  98 F (36.7 C)  TempSrc:  Oral  Oral  SpO2: 99% 100%  98%  Weight:   67.4 kg   Height:       General: awake, alert, lying comfortably in bed Psych: pleasant, conversant, in good spirits  Ext: RUE PICC site is c/d/i   Assessment/Plan:  Principal Problem:   MSSA bacteremia Active Problems:   Opioid use disorder (HCC)   Major depressive disorder, recurrent severe without psychotic features (HCC)   Arthritis, septic, knee (HCC)   Tenosynovitis of left ankle   Paraspinal abscess ([place])   IV drug abuse [facility])  James Beard an unhoused1 year old malewho injects drugs and has bipolar disorder and MDD who presented with right knee septic arthritis, left ankle septic tenosynovitis, and a thoracic paraspinal abscess in the setting MSSA bacteremia. He underwent right knee and left ankle washout 5/5. Infection and pain are currently well-controlled. He will remain in the hospital for a 6-week course of IV antibiotic therapy (started 5/4).  MSSA bacteremia Right knee septic arthritis Left ankle septic tenosynovitis T10-T12 right paraspinal abscess Neurosurgery does not recommend intervention for paraspinal abscess, and IR only drains lumbar abscesses. ID agrees with medical managementof paraspinal abscessat this time. Will continue to monitor back pain and possibly re-image to ensure improvement in the future.  -day 13/42 of Ancef  -pain control with Oxy IR q 6 hours  -ambulating halls without difficulty - Weekly CBC, BMP -will checkCRP/ESR three weeks into abx therapy (around 5/25)  Inflammatory arthritis:  Thoracic/lumbar MRI showed inflammatory arthritic changes of the costovertebral jointsthroughout the thoracic spineand bilateral sacoiliitis with fusion of the SI joints consistent with inflammatory arthritis.May be traumatic arthritis.HLA-B27 negative.  -NSAIDs prn  Substance use disorder: Regularly uses heroin, crystal meth, and crack cocaine. Also gets daily methadone with ADS.  -Continue methadone'70mg'$  daily - SWfollowingfor housing situation and substance use disorder - added Miralax to bowel regimen - given Hep B vaccine5/8(repeat in6/5monthand 11/8)  Bipolar disorder/major depressive disorder: -stable onolanzapine 2.5 mg daily andsertraline 100 mg daily  Dispo: Anticipated discharge after completion of IV antibiotics (6/17).   BModena NunneryD, DO 01/22/2019, 11:42 AM Pager: [phone]

## 2019-01-23 DIAGNOSIS — M4685 Other specified inflammatory spondylopathies, thoracolumbar region: Secondary | ICD-10-CM

## 2019-01-23 LAB — CBC
HCT: 29.9 % — ABNORMAL LOW (ref 39.0–52.0)
Hemoglobin: 9.5 g/dL — ABNORMAL LOW (ref 13.0–17.0)
MCH: 28.7 pg (ref 26.0–34.0)
MCHC: 31.8 g/dL (ref 30.0–36.0)
MCV: 90.3 fL (ref 80.0–100.0)
Platelets: 319 10*3/uL (ref 150–400)
RBC: 3.31 MIL/uL — ABNORMAL LOW (ref 4.22–5.81)
RDW: 12.9 % (ref 11.5–15.5)
WBC: 4 10*3/uL (ref 4.0–10.5)
nRBC: 0 % (ref 0.0–0.2)

## 2019-01-23 NOTE — Progress Notes (Signed)
Patient has staples to Right otter knee and left ankle, sites look unremarkable, however we have sore on the right thigh that the patient states looks worst. The others spots on legs and hands patient reports are improving.

## 2019-01-23 NOTE — Progress Notes (Addendum)
Patient upset about reminding to place his mask and was wanting to leave AMA, resettle the patient down and he agreed to stay.

## 2019-01-23 NOTE — Progress Notes (Signed)
Subjective: No acute events overnight. Mr. Kimmons was seen as he was about to ambulate hallway. He appears to be walking well with the boot. Denies pain or other acute concerns. He is in good spirits and looks forward to his next steps after leaving the hospital.   Objective:  Vital signs in last 24 hours: Vitals:   01/22/19 1401 01/22/19 2151 01/23/19 0526 01/23/19 0528  BP: (!) 141/91 123/70 102/66   Pulse: 78 78 67   Resp: _0 Temp: 97.8 F (36.6 C) 98 F (36.7 C) 98.3 F (36.8 C)   TempSrc: Oral Oral Oral   SpO2: 98% 99% 98%   Weight:    67.7 kg  Height:       General: pleasant gentleman seen while walking comfortably around in his room MSK: ambulating well in CAM boot; RUE PICC site c/d/i Psych: pleasant mood and affect; engages well    Assessment/Plan:  Principal Problem:   MSSA bacteremia Active Problems:   Opioid use disorder (HCC)   Major depressive disorder, recurrent severe without psychotic features (HCC)   Arthritis, septic, knee (HCC)   Tenosynovitis of left ankle   Paraspinal abscess ([place])   IV drug abuse [facility])  Mr. Cuccaro an unhoused56 year old malewho injects drugs and has bipolar disorder and MDD who presented with right knee septic arthritis, left ankle septic tenosynovitis, and a thoracic paraspinal abscess in the setting MSSA bacteremia. He underwent right knee and left ankle washout 5/5. Infection and pain are currently well-controlled. He will remain in the hospital for a 6-week course of IV antibiotic therapy (started 5/4).  MSSA bacteremia Right knee septic arthritis Left ankle septic tenosynovitis T10-T12 right paraspinal abscess Neurosurgery does not recommend intervention for paraspinal abscess, and IR only drains lumbar abscesses. ID agrees with medical managementof paraspinal abscessat this time. Will continue to monitor back pain and possibly re-image to ensure improvement in the future.  -day 14/42 of Ancef  -pain  control with Oxy IR q 6 hours  -ambulating halls without difficulty - weekly labs are stable  -will checkCRP/ESR three weeks into abx therapy (around 5/25)  Inflammatory arthritis: Thoracic/lumbar MRI showed inflammatory arthritic changes of the costovertebral jointsthroughout the thoracic spineand bilateral sacoiliitis with fusion of the SI joints consistent with inflammatory arthritis.May be traumatic arthritis.HLA-B27 negative.  -NSAIDs prn  Substance use disorder: Regularly uses heroin, crystal meth, and crack cocaine. Also gets daily methadone with ADS.  -Continue methadone25m daily - SWfollowingfor housing situation and substance use disorder; [facility] [place] - continue bowel regimen - given Hep B vaccine5/8(repeat in6/863monthnd 11/8)  Bipolar disorder/major depressive disorder: -stable onolanzapine 2.5 mg daily andsertraline 100 mg daily  Dispo: Anticipated discharge after completion of IV antibiotics (6/17).   BlDelice BisonDO 01/23/2019, 7:33 AM Pager: [phone]

## 2019-01-24 MED ORDER — MAGIC MOUTHWASH W/LIDOCAINE: 10.0000 mL | Freq: Three times a day (TID) | ORAL | Status: DC | PRN

## 2019-01-24 MED ORDER — TRAZODONE HCL 50 MG PO TABS
50.0000 mg | ORAL_TABLET | Freq: Once | ORAL | Status: AC
  Administered 2019-01-24: 01:00:00 50 mg via ORAL
  Filled 2019-01-24: qty 1

## 2019-01-24 MED ORDER — FERROUS GLUCONATE 324 (38 FE) MG PO TABS
324.0000 mg | ORAL_TABLET | Freq: Every day | ORAL | Status: DC
  Administered 2019-01-25: 09:00:00 324 mg via ORAL
  Filled 2019-01-24: qty 1

## 2019-01-24 MED ORDER — KETOROLAC TROMETHAMINE 15 MG/ML IJ SOLN
15.0000 mg | Freq: Four times a day (QID) | INTRAMUSCULAR | Status: AC | PRN
  Administered 2019-01-24 – 2019-01-25 (×4): 15 mg via INTRAVENOUS
  Filled 2019-01-24 (×4): qty 1

## 2019-01-24 MED ORDER — RAMELTEON 8 MG PO TABS
8.0000 mg | ORAL_TABLET | Freq: Every day | ORAL | Status: DC
  Administered 2019-01-24 – 2019-01-30 (×7): 8 mg via ORAL
  Filled 2019-01-24 (×8): qty 1

## 2019-01-24 MED ORDER — DICLOFENAC SODIUM 1 % TD GEL
2.0000 g | Freq: Four times a day (QID) | TRANSDERMAL | Status: DC
  Administered 2019-01-24 – 2019-01-31 (×17): 2 g via TOPICAL
  Filled 2019-01-24: qty 100

## 2019-01-24 NOTE — Progress Notes (Signed)
Pt c/o having difficulty falling asleep. Pt stated that he takes Trazodone at home to help him sleep. On call MD [facility] paged and made aware. Will continue to monitor and treat per MD orders.

## 2019-01-24 NOTE — Progress Notes (Signed)
   Subjective: James Beard was having some low back pain this morning which he says will often flare up based on the weather. As a result, he had a difficult time sleeping but was able to get some rest after the Trazodone was given.   Objective:  Vital signs in last 24 hours: Vitals:   01/23/19 1456 01/23/19 2205 01/24/19 0531 01/24/19 0535  BP: 126/72 124/73  107/62  Pulse: 79 83  66  Resp: '19 16  16  '$ Temp: 98.8 F (37.1 C) 98.3 F (36.8 C)  99.1 F (37.3 C)  TempSrc: Oral Oral  Oral  SpO2: 97% 100%  97%  Weight:   70.6 kg   Height:       General: awake, alert, sitting up in bed in NAD MSK: left lumbar paraspinal muscles with TTP    Assessment/Plan:  Principal Problem:   MSSA bacteremia Active Problems:   Opioid use disorder (HCC)   Major depressive disorder, recurrent severe without psychotic features (HCC)   Arthritis, septic, knee (HCC)   Tenosynovitis of left ankle   Paraspinal abscess (HCC)   IV drug abuse [facility])  James Beard an unhoused15 year old malewho injects drugs and has bipolar disorder and MDD who presented with right knee septic arthritis, left ankle septic tenosynovitis, and a thoracic paraspinal abscess in the setting MSSA bacteremia. He underwent right knee and left ankle washout 5/5. Infection and pain are currently well-controlled. He will remain in the hospital for a 6-week course of IV antibiotic therapy (started 5/4).  MSSA bacteremia Right knee septic arthritis Left ankle septic tenosynovitis T10-T12 right paraspinal abscess Neurosurgery does not recommend intervention for paraspinal abscess, and IR only drains lumbar abscesses. ID agrees with medical managementof paraspinal abscessat this time. Will continue to monitor back pain and possibly re-image to ensure improvement in the future.  -day 15/42 ofAncef  -pain control with Oxy IR q 6 hours -ambulating halls without difficulty - weekly labs are stable  -will checkCRP/ESR three  weeks into abx therapy (around 5/25)  Inflammatory arthritis: Thoracic/lumbar MRI showed inflammatory arthritic changes of the costovertebral jointsthroughout the thoracic spineand bilateral sacoiliitis with fusion of the SI joints consistent with inflammatory arthritis.May be traumatic arthritis.HLA-B27 negative.  - having increased pain in low back; will add voltaren gel and continue NSAIDs   Substance use disorder: Regularly uses heroin, crystal meth, and crack cocaine. Also gets daily methadone with ADS.  -Continue methadone'70mg'$  daily - SWfollowingfor housing situation and substance use disorder; interested in [facility] or [place]  -continue bowel regimen - given Hep B vaccine5/8(repeat in6/33monthand 11/8)  Bipolar disorder/major depressive disorder: -stable onolanzapine 2.5 mg daily andsertraline 100 mg daily  Dispo: Anticipated discharge after completion of IV antibiotics (6/17).   BModena NunneryD, DO 01/24/2019, 11:17 AM Pager: [phone]

## 2019-01-25 NOTE — Progress Notes (Signed)
   Subjective: James Beard was frustrated because he wasn't able to get much sleep with people coming in and out of his room. Assured him that we will make vitals checks less frequently. His low back pain has improved from yesterday. IV anti-inflammatory medication has been helping. No other acute concerns.   Objective:  Vital signs in last 24 hours: Vitals:   01/24/19 2214 01/25/19 0429 01/25/19 0437 01/25/19 0524  BP: 115/72 (!) 59/43  111/66  Pulse: 70 62  62  Resp:      Temp: 97.7 F (36.5 C) 98.1 F (36.7 C)    TempSrc: Oral Oral    SpO2: 99% 98%    Weight:   68.9 kg   Height:       General: awake, alert, lying comfortably in bed in NAD Ext: RUE PICC site c/d/i   Assessment/Plan:  Principal Problem:   MSSA bacteremia Active Problems:   Opioid use disorder (HCC)   Major depressive disorder, recurrent severe without psychotic features (HCC)   Arthritis, septic, knee (HCC)   Tenosynovitis of left ankle   Paraspinal abscess (HCC)   IV drug abuse [name] who presented with right knee septic arthritis, left ankle septic tenosynovitis, and a thoracic paraspinal abscess in the setting MSSA bacteremia. He underwent right knee and left ankle washout 5/5. Infection and pain are currently well-controlled. He will remain in the hospital for a 6-week course of IV antibiotic therapy (started 5/4).  MSSA bacteremia Right knee septic arthritis Left ankle septic tenosynovitis T10-T12 right paraspinal abscess Neurosurgery does not recommend intervention for paraspinal abscess, and IR only drains lumbar abscesses. ID agrees with medical managementof paraspinal abscessat this time. Will continue to monitor back pain and possibly re-image to ensure improvement in the future.  -day 16/42 ofAncef  -pain control with Oxy IR q 6 hours -ambulating halls without difficulty -weekly labs are  stable -will checkCRP/ESR three weeks into abx therapy (around 5/25)  Inflammatory arthritis: Thoracic/lumbar MRI showed inflammatory arthritic changes of the costovertebral jointsthroughout the thoracic spineand bilateral sacoiliitis with fusion of the SI joints consistent with inflammatory arthritis.May be traumatic arthritis.HLA-B27 negative.  - low back pain improved; will continue Toradol PRN and voltaren gel   Substance use disorder: Regularly uses heroin, crystal meth, and crack cocaine. Also gets daily methadone with ADS.  -Continue methadone'70mg'$  daily - SWfollowingfor housing situation and substance use disorder; interested in [place] or [place] -continue bowel regimen - given Hep B vaccine5/8(repeat in6/14monthand 11/8)  Bipolar disorder/major depressive disorder: -stable onolanzapine 2.5 mg daily andsertraline 100 mg daily  Dispo: Anticipated discharge after completion of IV antibiotics (6/17).   BModena NunneryD, DO 01/25/2019, 9:47 AM Pager: [phone]

## 2019-01-26 MED ORDER — CHLORHEXIDINE GLUCONATE CLOTH 2 % EX PADS
6.0000 | MEDICATED_PAD | Freq: Every day | CUTANEOUS | Status: DC
  Administered 2019-01-27 – 2019-01-31 (×4): 6 via TOPICAL

## 2019-01-26 NOTE — Progress Notes (Signed)
   Subjective: HD#19   Overnight: No acute overnight events reported  Today, James Beard. was examined at bedside comfortably lying in bed.  He denies any new complaints, fevers, chills.  Objective:  Vital signs in last 24 hours: Vitals:   01/25/19 0524 01/25/19 1343 01/25/19 2052 01/26/19 0500  BP: 111/66 (!) 131/104 112/66   Pulse: 62 68 67   Resp:  18    Temp:  98.2 F (36.8 C) 98 F (36.7 C)   TempSrc:  Oral Oral   SpO2:  99% 98%   Weight:    69.7 kg  Height:       Const: In no apparent distress, lying comfortably in bed Ext: Right knee I&D site with staples, clean, dry, intact.  Assessment/Plan:  Principal Problem:   MSSA bacteremia Active Problems:   Opioid use disorder (HCC)   Arthritis, septic, knee (HCC)   Tenosynovitis of left ankle   Paraspinal abscess [facility])  James Beard an unhoused47 year old malewho injects drugs and has bipolar disorder and MDD who presented with right knee septic arthritis, left ankle septic tenosynovitis, and a thoracic paraspinal abscess in the setting MSSA bacteremia. He underwent right knee and left ankle washout 5/5. Infection and pain are currently well-controlled. He will remain in the hospital for a 6-week course of IV antibiotic therapy (started 5/4).  MSSA bacteremia Right knee septic arthritis Left ankle septic tenosynovitis T10-T12 right paraspinal abscess No new complaints.  He is doing well on examination.  He was lying comfortably in bed without any issues.  His right knee I&D site has staples, the site is clean, dry and intact.  - No surgical intervention per neurosurgery.  Recommends IR for I&D per spinal lumbar abscesses -Continue to monitor and reimage in the future -Day 17/42 of Ancef -Pain control with OxyIR every 6 hours -Encourage ambulation -Weekly labs -Obtain CRP/ESR about 3 weeks into antibiotic therapy (approximately 5/25)  Inflammatory arthritis: Negative work-up for ankylosing  spondylitis.  HLA-B27 negative.  Historical/lumbar MRI did show inflammatory arthritic changes throughout the thoracic spine and bilateral sacroiliitis with fusion of the SI joints. -Continue Toradol PRN and voltaren gel   Substance use disorder: Regularly uses heroin, crystal meth, and crack cocaine. Also gets daily methadone with ADS.  -Continue methadone'70mg'$  daily -SWfollowingfor housing situation and substance use disorder; interested in [place] or [place] -Continue bowel regimen -Given Hep B vaccine5/8(repeat in6/37monthand 11/8)  Bipolar disorder/major depressive disorder: -Stable onolanzapine 2.5 mg daily andsertraline 100 mg daily  Dispo: Anticipated discharge after completion of IV antibiotics (6/17).   AJean Rosenthal MD 01/26/2019, 6:56 AM Pager: [phone] PGY-1

## 2019-01-26 NOTE — Discharge Summary (Signed)
Name: James Beard. MRN: [id number] DOB: 1986-01-31 33 y.o. PCP: Marliss Coots, NP  Date of Admission: 01/07/2019 11:44 AM Date of Discharge: 01/31/2019 Attending Physician: Dr. Evette Doffing   Discharge Diagnosis: 1.  MSSA bacteremia 2.  Right knee septic arthritis 3.  Left ankle septic tenosynovitis 4.  T10-T12 right paraspinal abscess 5.  Possible inflammatory arthritis 6.  Substance use disorder 7.  Bipolar disorder/major depressive disorder  Discharge Medications:   Disposition and follow-up:   Mr.James Beard. was discharged from [facility] in [place] condition.  At the hospital follow up visit please address:  1. MSSA bacteremia; Right knee septic arthritis; Left ankle septic tenosynovitis; T10-T12 right paraspinal abscess: Unfortunately did not complete entire 6 weeks of IV Cefazolin 01/08/2019 - 01/31/2019  On 01/31/2019 Mr. Quesnell was informed that he had an accidental exposure to an asymptomatic COVID19 positive employee.  An order for SARS-CoV-2 was placed however he refused the lab test because it was uncomfortable.  On further discussion, he decided to leave [place] after all options were presented to him.  I was able to reach out to him after leaving the hospital and prescribed a 19-day course of Keflex 500 mg twice daily.  2.  Labs / imaging needed at time of follow-up: MRI Spine   3.  Pending labs/ test needing follow-up: None  Follow-up Appointments: Follow-up Information    [place]. Go on 02/05/2019.   Why:  hospital follow up scheduled for 6/26//2020 at 2:30 pm with Dr. Antony Blackbird  Contact information: [place] [id number] [place] [facility] Course by problem list: 1.  MSSA bacteremia; Right knee septic arthritis; Left ankle septic tenosynovitis; T10-T12 right paraspinal abscess:  [name] is a 33 year old  gentleman who injects IV drugs (heroin, crystal meth and crack cocaine) on methadone, also with a history of bipolar disorder and major depressive disorder who presented to [facility] emergency department on Jan 07, 2019 with right knee and left ankle pain.  Pain had been very progressive and he had also complained of swelling in these joints after he fell off a bench a week prior to arrival to the hospital.   On arrival to the ED he was febrile to 100.9, he did have a 2/6 systolic murmur at the apex, lungs were clear to auscultation, abdomen was soft, nontender to palpation, his right knee was swollen warm to touch, erythematous and tender to palpation at the medial aspect, his left ankle was swollen and warm to touch, he did have midline tenderness of the spine in the sacral region.  Initial laboratory findings included elevated CRP of 18.1, ESR 83, no leukocytosis, hemoglobin was unremarkable, right knee arthrocentesis revealed turbid amber fluid without crystals, 48,000 WBCs with 95% neutrophils, no real fluid culture revealed abundant PMNs and rare gram-positive cocci.  Right knee x-ray showed large effusion, left ankle x-ray without any abnormalities, MRI of the left ankle shows septic tenosynovitis, MRI thoracic and lumbar spine showed right paraspinal abscesses from T8-T4 measuring about 7 x 7 x 2 cm in addition to inflammatory arthritic changes of the costovertebral joints throughout the thorax and spine and bilateral sacroiliitis with fusion of the SI joints.    Orthopedic surgery, infectious disease and neurosurgery were consulted regarding his care.  Neurosurgery had recommended interventional radiology for drainage of lumbar abscesses.  He went to the  operating room for right knee and left ankle washout by orthopedic surgery on Jan 08, 2019.  His blood cultures subsequently grew MSSA. TTE revealed ejection fraction of 60-65% without vegetation.  Initial antibiotic regimen was vancomycin and cefepime  however given that his blood cultures grew MSSA, he was narrowed down to Ancef for total of 6 weeks  His symptoms improved and he was medically manage with IV antibiotics.  Unfortunately he had to stay for the entire duration of the 6-week course of IV antibiotics due to social constraints.   Repeat ESR and CRP shows good response. CRP 1.3<<18.1 // ESR 35<<83  On 01/31/2019 Mr. Gertz was informed that he had an accidental exposure to an asymptomatic COVID19 positive employee.  An order for SARS-CoV-2 was placed however he refused the lab test because it was uncomfortable.  On further discussion, he decided to leave [place] after all options were presented to him.  I was able to reach out to him after leaving the hospital and prescribed a 19-day course of Keflex 500 mg twice daily  2. Inflammatory arthritis: Negative work-up for ankylosing spondylitis. HLA-B27 negative. Thoracic/lumbar MRI did show inflammatory arthritic changes throughout the thoracic spine and bilateral sacroiliitis with fusion of the SI joints. He was managed with Toradol PRN andvoltaren gel   3. Substance use disorder: [name]. regularly uses heroin, crystal meth, and crack cocaine. Also gets daily methadone with ADS.We continued methadone64m daily. We received assistance from social workfollowingfor housing situation and substance use disorder.  [facility] [facility]-Continue bowel regimen -Given Hep B vaccine5/8(repeat in6/857monthnd 11/8)  4. Bipolar disorder/major depressive disorder: -Stable onolanzapine 2.5 mg daily andsertraline 100 mg daily  Discharge Vitals:   BP 129/67    Pulse 76    Temp 98.3 F (36.8 C) (Oral)    Resp 17    Ht 5' 9.02" (1.753 m)    Wt 72.5 kg    SpO2 98%    BMI 23.59 kg/m   Pertinent Labs, Studies, and Procedures:  LEFT ANKLE COMPLETE - 3+ VIEW COMPARISON:  None.  FINDINGS: There is no evidence of fracture, dislocation,  or joint effusion. There is no evidence of arthropathy or other focal bone abnormality. Soft tissues are unremarkable.  IMPRESSION: Negative for acute bony abnormality   RIGHT KNEE - COMPLETE 4+ VIEW COMPARISON:  10/24/2018, 08/26/2014 and 05/01/2013 radiographs  FINDINGS: A large knee effusion is present.  There is no evidence of acute fracture, subluxation or dislocation.  Mild tricompartmental joint space narrowing and osteophytosis noted.  IMPRESSION: 1. Large knee effusion.  No acute bony abnormality. 2. Mild tricompartmental degenerative changes.   MRI LUMBAR SPINE WITHOUT AND WITH CONTRAST TECHNIQUE: Multiplanar and multiecho pulse sequences of the lumbar spine were obtained without and with intravenous contrast.  CONTRAST:  7 cc Gadavist  COMPARISON:  CT scan of the abdomen dated 12/31/2018  FINDINGS: Segmentation:  Standard.  Alignment:  Physiologic.  Vertebrae: There are Schmorl's nodes in the anterior aspects of the superior endplates of L4 and L5.  Conus medullaris and cauda equina: Conus extends to the T12-L1 level. Conus and cauda equina appear normal.  Paraspinal and other soft tissues: There is sclerosis and fusion of the sacroiliac joints with enhancement of the SI joints after contrast administration. Does the patient have known inflammatory arthritis such as ankylosing spondylitis?  Disc levels:  L1-2: Normal.  L2-3: Normal.  L3-4: Disc desiccation with slight disc space narrowing. Tiny broad-based disc  bulge with no neural impingement. Schmorl's node in the anterior superior aspect of L4.  L4-5: Normal disc. Schmorl's node in the anterior superior aspect of the L5 vertebral body.  L5-S1: Normal.  No pathologic enhancement in the lumbar spine after contrast administration.  IMPRESSION: 1. No acute abnormality of the lumbar spine. 2. Bilateral sacroiliitis with fusion of the SI joints consistent with  inflammatory arthritis. 3. Critical Value/emergent results were called by telephone at the time of interpretation on 01/08/2019 at 9:23 am to Dr. Lars Mage, MD , who verbally acknowledged these results.    MRI THORACIC WITHOUT AND WITH CONTRAST TECHNIQUE: Multiplanar and multiecho pulse sequences of the thoracic spine were obtained without and with intravenous contrast.  CONTRAST:  7 cc Gadavist  COMPARISON:  CT scan of the abdomen and pelvis dated 12/31/2018  FINDINGS: MRI THORACIC SPINE FINDINGS  Alignment:  Physiologic.  Vertebrae: There multiple areas of increased T2 signal from the posterolateral aspects of multiple thoracic vertebra which are associated with the costovertebral joints bilaterally. There is also increased signal from the ribs at the same level at those joints. No visible discitis. No bone destruction. However, there are arthritic changes at the costovertebral joints throughout the majority of the thoracic spine.  Cord:  Normal.  Paraspinal and other soft tissues: There is a right paraspinal abscess measuring 7 x 7 x 2 cm extending from T8-9 through T11-12. There is a tiny right pleural effusion. There is no extension of the abscess into the spinal canal at this time.  Disc levels:  The discs throughout the thoracic spine are normal except for minimal degenerative changes at T7-8 and T9-10 and T10-11. There is a tiny central disc osteophyte complex which touches the ventral aspect of the spinal cord at T10-11 without myelopathy.  IMPRESSION: 1. Right paraspinal abscess extending from T10-T12 immediately adjacent to the thoracic spine and immediately to the right of the descending thoracic aorta. 2. No evidence of abscess in the epidural space. No evidence of discitis or osteomyelitis at this time. 3. Inflammatory arthritic changes of the costovertebral joints throughout the thoracic spine. Does the patient have inflammatory systemic  arthritis?   MRI OF THE LEFT ANKLE WITHOUT AND WITH CONTRAST TECHNIQUE: Multiplanar, multisequence MR imaging of the ankle was performed before and after the administration of intravenous contrast.  CONTRAST:  7 cc Gadavist IV.  COMPARISON:  Plain films left ankle 0 5/0 12/1018.  FINDINGS: TENDONS  Peroneal: There is a moderately large volume of rim enhancing fluid containing some debris in the sheaths of the peroneal tendons consistent with septic tenosynovitis. Fluid is mainly posterior to the distal fibula and along the calcaneus. There is some edema and enhancement in the distal peroneus brevis muscle belly consistent with myositis. The tendons are intact.  Posteromedial: Intact and normal in appearance.  Anterior: Intact and normal in appearance.  Achilles: Intact and normal in appearance.  Plantar Fascia: Intact.  Minimal edema is noted in the medial cord.  LIGAMENTS  Lateral: Intact.  Medial: Intact.  CARTILAGE  Ankle Joint: Small tibiotalar joint effusion is present.  Subtalar Joints/Sinus Tarsi: Appear normal.  Bones: No marrow signal abnormality to suggest osteomyelitis. No fracture or stress change.  Other: There is some subcutaneous edema about the foot, greater on the lateral side.  IMPRESSION: IMPRESSION Findings consistent with septic tenosynovitis of both the peroneus longus and brevis. Edema and enhancement in the distal peroneus brevis muscle belly is consistent with myositis.  Negative for osteomyelitis.  Findings consistent  with cellulitis about the ankle.  Small tibiotalar joint effusion could be septic or aseptic.  Mild edema in the medial cord of the plantar fascia consistent with plantar fasciitis.  Discharge Instructions:   Signed: Jean Rosenthal, MD 01/31/2019, 7:09 PM   Pager: [phone] IMTS PGY-1

## 2019-01-26 NOTE — Progress Notes (Signed)
Pt resting in bed, PICC line in place. Reports pain 8/10, given prn per MAR. Given fresh gowns as he stated he sweated heavily overnight. Will continue to monitor.

## 2019-01-26 NOTE — Progress Notes (Signed)
  Date: 01/26/2019  Patient name: James Beard.  Medical record number: [id number]  Date of birth: [birth year]   This patient's plan of care was discussed with the house staff. Please see Dr. Verdell Face note for complete details. I concur with his findings.   Inez Catalina, MD 01/26/2019, 11:30 AM

## 2019-01-27 MED ORDER — KETOROLAC TROMETHAMINE 30 MG/ML IJ SOLN
30.0000 mg | Freq: Once | INTRAMUSCULAR | Status: AC
  Administered 2019-01-27: 30 mg via INTRAVENOUS
  Filled 2019-01-27: qty 1

## 2019-01-27 NOTE — Progress Notes (Signed)
   Subjective: HD#20   Overnight: No acute events reported  Today, James Beard.  Was much more awake and pleasant today. He reports that he is doing "alright" and no change in his symptoms since last evaluated yesterday. Denies fevers, chills.   Objective:  Vital signs in last 24 hours: Vitals:   01/26/19 0500 01/26/19 1537 01/26/19 2116 01/27/19 0500  BP:  113/79 129/74   Pulse:  72 (!) 106   Resp:  16 18   Temp:  98 F (36.7 C) 98.6 F (37 C)   TempSrc:  Oral Oral   SpO2:  98% 94%   Weight: 69.7 kg   71.7 kg  Height:       Const:In NAD, lying in bed comfortably Resp: CTABL, no wheezes, crackles, rhonchi  Neuro: Mood appropriate  Assessment/Plan:  Principal Problem:   MSSA bacteremia Active Problems:   Opioid use disorder (HCC)   Arthritis, septic, knee (HCC)   Tenosynovitis of left ankle   Paraspinal abscess [facility])  James Beard an unhoused55 year old malewho injects drugs and has bipolar disorder and MDD who presented with right knee septic arthritis, left ankle septic tenosynovitis, and a thoracic paraspinal abscess in the setting MSSA bacteremia. He underwent right knee and left ankle washout 5/5. Infection and pain are currently well-controlled. He will remain in the hospital for a 6-week course of IV antibiotic therapy (started 5/4).  MSSA bacteremia Right knee septic arthritis Left ankle septic tenosynovitis T10-T12 right paraspinal abscess Clinically stable.   - No surgical intervention per neurosurgery.  Recommends IR for I&D per spinal lumbar abscesses -Continue to monitor and reimage in the future -Day 18/42 of Ancef -Pain control with OxyIR every 6 hours -Encourage ambulation -Weekly labs -Obtain CRP/ESR about 3 weeks into antibiotic therapy (approximately 5/25)  Inflammatory arthritis: No new complaints. -Negative work-up for ankylosing spondylitis.  HLA-B27 negative.  Historical/lumbar MRI did show inflammatory arthritic changes  throughout the thoracic spine and bilateral sacroiliitis with fusion of the SI joints. -Continue Toradol PRN andvoltaren gel   Substance use disorder: Regularly uses heroin, crystal meth, and crack cocaine. Also gets daily methadone with ADS.  -Continue methadone'70mg'$  daily -SWfollowingfor housing situation and substance use disorder; interested in [facility] or [place] -Continue bowel regimen -Given Hep B vaccine5/8(repeat in6/34monthand 11/8)  Bipolar disorder/major depressive disorder: -Stable onolanzapine 2.5 mg daily andsertraline 100 mg daily  Dispo: Anticipated dischargeafter completion of IV antibiotics (6/17).  AJean Rosenthal MD 01/27/2019, 6:19 AM Pager: [phone] PGY-1

## 2019-01-27 NOTE — Plan of Care (Signed)

## 2019-01-28 NOTE — Progress Notes (Signed)
   Subjective: HD#21   Overnight: Complain of right knee pain and received Toradol.  Today, Daune Colgate. was examined at bedside he denies any new complaints this morning.  Denies fevers, chills.  He did tell me that he has been homeless for about a year after his father kicked him out of the house for using IV drugs.  We discussed possibility of staying of IV drugs and he tells me that it is challenging being sober while homeless.  He usually leaves intense and moved from place to place.  Objective:  Vital signs in last 24 hours: Vitals:   01/27/19 0500 01/27/19 1436 01/27/19 2130 01/28/19 0547  BP:  126/74 123/70   Pulse:  75 82   Resp:  19 18   Temp:  98.4 F (36.9 C) 98 F (36.7 C)   TempSrc:  Oral Oral   SpO2:  99% 100%   Weight: 71.7 kg   71.3 kg  Height:       Const: In no apparent distress, lying comfortably in bed HEENT: Atraumatic, normocephalic Resp: CTA BL, no wheezes, crackles, rhonchi CV: RRR, no murmurs, gallops, rubs Ext: No lower extremity edema.   Assessment/Plan:  Principal Problem:   MSSA bacteremia Active Problems:   Opioid use disorder (HCC)   Arthritis, septic, knee (HCC)   Tenosynovitis of left ankle   Paraspinal abscess [facility])  Mr. Howington an unhoused9 year old malewho injects drugs and has bipolar disorder and MDD who presented with right knee septic arthritis, left ankle septic tenosynovitis, and a thoracic paraspinal abscess in the setting MSSA bacteremia. He underwent right knee and left ankle washout 5/5. Infection and pain are currently well-controlled. He will remain in the hospital for a 6-week course of IV antibiotic therapy (started 5/4).  MSSA bacteremia Right knee septic arthritis Left ankle septic tenosynovitis T10-T12 right paraspinal abscess No new changes.  Objectively no fevers.  We will repeat CBC today. - No surgical intervention per neurosurgery. Recommends IR for I&D per spinal lumbar abscesses -Continue  to monitor and reimage in the future -Day 19/42 of Ancef -Pain control with OxyIR every 6 hours -Encourage ambulation -Weekly labs -Obtain CRP/ESR about 3 weeks into antibiotic therapy (approximately 5/25)  Inflammatory arthritis:Stable. -Negative work-up for ankylosing spondylitis. HLA-B27 negative. Thoracic/lumbar MRI did show inflammatory arthritic changes throughout the thoracic spine and bilateral sacroiliitis with fusion of the SI joints. -Continue Toradol PRN andvoltaren gel   Substance use disorder: He tells me he is never been sober due to challenges being homeless. Regularly uses heroin, crystal meth, and crack cocaine. Also gets daily methadone with ADS. -Continue methadone'70mg'$  daily -SWfollowingfor housing situation and substance use disorder; interested in [place] or [place] -Continue bowel regimen -Given Hep B vaccine5/8(repeat in6/70monthand 11/8)  Bipolar disorder/major depressive disorder: -Stable onolanzapine 2.5 mg daily andsertraline 100 mg daily  Dispo: Anticipated dischargeafter completion of IV antibiotics (6/17).  AJean Rosenthal MD 01/28/2019, 6:07 AM Pager: [phone] PGY-1

## 2019-01-29 LAB — BASIC METABOLIC PANEL
Anion gap: 9 (ref 5–15)
BUN: 20 mg/dL (ref 6–20)
CO2: 29 mmol/L (ref 22–32)
Calcium: 9.1 mg/dL (ref 8.9–10.3)
Chloride: 103 mmol/L (ref 98–111)
Creatinine, Ser: 0.54 mg/dL — ABNORMAL LOW (ref 0.61–1.24)
GFR calc Af Amer: >60 mL/min (ref >60)
GFR calc non Af Amer: >60 mL/min (ref >60)
Glucose, Bld: 95 mg/dL (ref 70–99)
Potassium: 4.1 mmol/L (ref 3.5–5.1)
Sodium: 141 mmol/L (ref 135–145)

## 2019-01-29 LAB — CBC
HCT: 30.4 % — ABNORMAL LOW (ref 39.0–52.0)
Hemoglobin: 9.6 g/dL — ABNORMAL LOW (ref 13.0–17.0)
MCH: 28.8 pg (ref 26.0–34.0)
MCHC: 31.6 g/dL (ref 30.0–36.0)
MCV: 91.3 fL (ref 80.0–100.0)
Platelets: 310 10*3/uL (ref 150–400)
RBC: 3.33 MIL/uL — ABNORMAL LOW (ref 4.22–5.81)
RDW: 13.5 % (ref 11.5–15.5)
WBC: 5.8 10*3/uL (ref 4.0–10.5)
nRBC: 0 % (ref 0.0–0.2)

## 2019-01-29 NOTE — Progress Notes (Addendum)
   Subjective: HD#22   Overnight: No acute events reported   Today, James R Peeters Jr. was very excited to see us. He was standing by the door waiting to warm up his coffee. He states that he is doing well and anticipating to stay though June 17th. We showed him his previous thoracic MRI so he had a better understanding of why we are treating him with a long course of antibiotics.   Objective:  Vital signs in last 24 hours: Vitals:   01/28/19 0547 01/28/19 1232 01/28/19 2140 01/29/19 0542  BP:  119/80 115/65   Pulse:  93 76   Resp:  16 16   Temp:  98.8 F (37.1 C) 98.7 F (37.1 C)   TempSrc:  Oral Oral   SpO2:  98% 97%   Weight: 71.3 kg   72.6 kg  Height:       Const: In NAD, very pleasant gentlemen CV: RRR, no murmurs, gallops, rubs Ext: No lower extremity edema. Back: No point tenederness.    Assessment/Plan:  Principal Problem:   MSSA bacteremia Active Problems:   Opioid use disorder (HCC)   Arthritis, septic, knee (HCC)   Tenosynovitis of left ankle   Paraspinal abscess (HCC)  James Beardis an unhoused32-year-old malewho injects drugs and has bipolar disorder and MDD who presented with right knee septic arthritis, left ankle septic tenosynovitis, and a thoracic paraspinal abscess in the setting MSSA bacteremia. He underwent right knee and left ankle washout 5/5. Infection and pain are currently well-controlled. He will remain in the hospital for a 6-week course of IV antibiotic therapy (started 5/4).  MSSA bacteremia Right knee septic arthritis Left ankle septic tenosynovitis T10-T12 right paraspinal abscess Stable. Has remained afebrile without leukocytosis. On examination, there is no point tenderness on back exam.  -No surgical intervention per neurosurgery. Recommends IR for I&D per spinal lumbar abscesses -Continue to monitor and reimage in the future -Day 20/42 of Ancef -Pain control with OxyIR every 6 hours -Encourage ambulation -Weekly labs  -Follow up CRP/ESR   Inflammatory arthritis:Stable. -Negative work-up for ankylosing spondylitis. HLA-B27 negative. Thoracic/lumbar MRI did show inflammatory arthritic changes throughout the thoracic spine and bilateral sacroiliitis with fusion of the SI joints. -Continue Toradol PRN andvoltaren gel   Substance use disorder:   -Regularly uses heroin, crystal meth, and crack cocaine. Also gets daily methadone with ADS. -Continue methadone70mg daily -SWfollowingfor housing situation and substance use disorder; interested in [name] or [name] -Continue bowel regimen -Given Hep B vaccine5/8(repeat in6/8month and 11/8)  Bipolar disorder/major depressive disorder: -Stable onolanzapine 2.5 mg daily andsertraline 100 mg daily  Dispo: Anticipated dischargeafter completion of IV antibiotics (6/17).  ,  K, MD 01/29/2019, 6:06 AM Pager: [phone] IMTS PGY-1 

## 2019-01-29 NOTE — Progress Notes (Addendum)
VAST consulted to disconnect PICC at pt's request. Upon arrival to pt's bedside, pt informed VAST RN he had already been flushed and disconnected by charge nurse. Spoke with [employer], charge nurse; she informed she had SWOT nurse flush and disconnect pt's PICC earlier as pt was getting antsy.

## 2019-01-29 NOTE — Progress Notes (Signed)
Nutrition Follow-up  RD working remotely.  DOCUMENTATION CODES:   Not applicable  INTERVENTION:   -Continue Ensure Enlive po TID, each supplement provides 350 kcal and 20 grams of protein -Continue 30 ml Prostat BID, each supplement provides 100 kcals and 15 grams protein -Continue MVI with minerals daily  NUTRITION DIAGNOSIS:   Increased nutrient needs related to post-op healing as evidenced by estimated needs.  Ongoing  GOAL:   Patient will meet greater than or equal to 90% of their needs  Progressing  MONITOR:   PO intake, Supplement acceptance, Weight trends, Diet advancement, Labs  REASON FOR ASSESSMENT:   Malnutrition Screening Tool    ASSESSMENT:   33 year old male who presented to the ED on 5/04 with knee pain. PMH of bipolar disorder, homelessness, and IV drug use. Arthrocentesis of right knee performed at bedside in the ED. Pt found to have septic arthritis of right knee, tenosynovitis of left ankle, and paraspinal abscess.  5/5 - s/p I&D of right knee and debridement of peroneal tendons and deep abscess of left ankle 5/11- PICC placed  Reviewed I/O's: -630 ml x 24 hours and -3.5 L since admission  UOP: 980 ml x 24 hours  Per MD notes, expect prolonged hospitalization secondary to need for antibiotics and history of IV drug use. Pt will require 6 weeks of IV antibiotics (anticipate discharge 02/20/19).   Pt remains with good appetite; meal completion documented at 100% of meal consumed. Pt is compliant with Ensure, Prostat, and MVI. Noted steady wt gain since admission, likely due to improved nutritional intake.   Pt at high risk of malnutrition due to significant weight loss, homelessness, and history of IV drug use. However, unable to identify without further history and completion of nutrition-focused physical exam.  Labs reviewed: Phos: 4.8, Mg and K WDL.   Diet Order:   Diet Order            Diet regular Room service appropriate? Yes; Fluid  consistency: Thin  Diet effective now              EDUCATION NEEDS:   Not appropriate for education at this time  Skin:  Skin Assessment: Skin Integrity Issues: Skin Integrity Issues:: Incisions Incisions: closed incisions to right knee, left ankle  Last BM:  01/28/19  Height:   Ht Readings from Last 1 Encounters:  01/07/19 5' 9.02" (1.753 m)    Weight:   Wt Readings from Last 1 Encounters:  01/29/19 72.6 kg    Ideal Body Weight:  72.8 kg  BMI:  Body mass index is 23.62 kg/m.  Estimated Nutritional Needs:   Kcal:  2000-2200  Protein:  100-115 grams  Fluid:  2.0-2.2 L    Daniela Siebers A. Mayford Knife, RD, LDN, CDCES Registered Dietitian II Certified Diabetes Care and Education Specialist Pager: [phone] After hours Pager: [phone]

## 2019-01-29 NOTE — Progress Notes (Signed)
Staples removed per Dr. Dortha Schwalbe. 7 removed from right knee and 13 removed from left lateral ankle. Tolerated well.

## 2019-01-30 ENCOUNTER — Inpatient Hospital Stay: Payer: Self-pay | Admitting: Family Medicine

## 2019-01-30 LAB — C-REACTIVE PROTEIN: CRP: 1.3 mg/dL — ABNORMAL HIGH (ref <1.0)

## 2019-01-30 LAB — SEDIMENTATION RATE: Sed Rate: 35 mm/hr — ABNORMAL HIGH (ref 0–16)

## 2019-01-30 NOTE — Progress Notes (Signed)
   Subjective: HD#23   Overnight: Left ankle and right knee staples removed.  Today, James Beard. reported that he feels tired because he walked quite a bit yesterday. He states that he has gained about ~30 pounds since admission which he attributes to better nutrition here at the hospital.   Objective:  Vital signs in last 24 hours: Vitals:   01/28/19 2140 01/29/19 0542 01/29/19 2156 01/30/19 0518  BP: 115/65  123/79   Pulse: 76  61   Resp: 16  18   Temp: 98.7 F (37.1 C)  97.7 F (36.5 C)   TempSrc: Oral  Oral   SpO2: 97%  98%   Weight:  72.6 kg  73.7 kg  Height:       Mood: Appropriate Ext:     Assessment/Plan:  Principal Problem:   MSSA bacteremia Active Problems:   Opioid use disorder (HCC)   Arthritis, septic, knee (HCC)   Tenosynovitis of left ankle   Paraspinal abscess [facility])  James Beard an unhoused48 year old malewho injects drugs and has bipolar disorder and MDD who presented with right knee septic arthritis, left ankle septic tenosynovitis, and a thoracic paraspinal abscess in the setting MSSA bacteremia. He underwent right knee and left ankle washout 5/5. Infection and pain are currently well-controlled. He will remain in the hospital for a 6-week course of IV antibiotic therapy (started 5/4). Staples were removed on 01/29/2019  MSSA bacteremia Right knee septic arthritis Left ankle septic tenosynovitis T10-T12 right paraspinal abscess Doing well today without any complaints. His left ankle and right knee staples were removed yesterday without any difficulties. Repeat ESR and CRP shows good response. CRP 1.3<<18.1 // ESR 35<<83 -No surgical intervention per neurosurgery. Recommends IR for I&D per spinal lumbar abscesses -Continue to monitor and reimage in the future -Day 20/42 of Ancef -Pain control with OxyIR every 6 hours -Encourage ambulation -Weekly labs -Follow up CRP/ESR   Inflammatory arthritis:  -Negative work-up for  ankylosing spondylitis. HLA-B27 negative. Thoracic/lumbar MRI did show inflammatory arthritic changes throughout the thoracic spine and bilateral sacroiliitis with fusion of the SI joints. -Continue Toradol PRN andvoltaren gel   Substance use disorder:   -Regularly uses heroin, crystal meth, and crack cocaine. Also gets daily methadone with ADS. -Continue methadone62m daily -SWfollowingfor housing situation and substance use disorder; [place]-Continue bowel regimen -Given Hep B vaccine5/8(repeat in6/840monthnd 11/8)  Bipolar disorder/major depressive disorder: -Stable onolanzapine 2.5 mg daily andsertraline 100 mg daily  Dispo: Anticipated dischargeafter completion of IV antibiotics (6/17).  AgJean RosenthalMD 01/30/2019, 6:02 AM Pager: [phone] PGY-1

## 2019-01-31 DIAGNOSIS — Z79899 Other long term (current) drug therapy: Secondary | ICD-10-CM

## 2019-01-31 NOTE — Progress Notes (Signed)
Request to saline lock patient's PICC for shower.  Spoke with Chip Boer, RN.  Currently no order from MD that patient may shower.  Explained that risk for infection increases with each disconnect and reconnect.  Patient's PICC has already come out an extra 2 cm from insertion and now is at 4 cm out, showering increases risk that the PICC will come out further.  Chip Boer, RN verbalizes understanding and agreement that PICC will not be capped off for shower at this time.  Gasper Lloyd, RN VAST

## 2019-01-31 NOTE — Progress Notes (Signed)
Date: 01/31/2019 Patient: James Beard. Admitted: 01/07/2019 11:44 AM Attending Provider: Tyson Alias, *  [name] or his authorized caregiver has made the decision for the patient to leave the hospital against the advice of [name] MD.  He or his authorized caregiver has been informed and understands the inherent risks, including death.  He or his authorized caregiver has decided to accept the responsibility for this decision. [name] and all necessary parties have been advised that he may return for further evaluation or treatment. His condition at time of discharge was Good.  [name] had current vital signs as follows:   Blood pressure 129/67, pulse 76, temperature 98.3 F (36.8 C), temperature source Oral, resp. rate 17, height 5' 9.02" (1.753 m), weight 72.5 kg, SpO2 98 %.  [name] was recommended to repeat COVID testing but he refused to have the test and refused to stay in his room.   [name] or his authorized caregiver has signed the Leaving Against Medical Advice form prior to leaving the department.  [name], MSN RN [facility] 01/31/2019

## 2019-01-31 NOTE — Progress Notes (Signed)
   Subjective: HD#24   Overnight: No acute events reported.  Today, Jamesrobert Ohanesian. was examined at bedside and was happy because he had received new clothing from a hospital staff.   Objective:  Vital signs in last 24 hours: Vitals:   01/30/19 0518 01/30/19 1655 01/30/19 2119 01/31/19 0519  BP:  (!) 111/59 125/67   Pulse:  66 65   Resp:  18    Temp:  98.4 F (36.9 C) 98.7 F (37.1 C)   TempSrc:  Oral Oral   SpO2:  98% 99%   Weight: 73.7 kg   72.5 kg  Height:       Constitutional: In no apparent distress, lying in bed comfortably Cardiovascular: RRR, no murmurs, gallops, rubs   Assessment/Plan:  Principal Problem:   MSSA bacteremia Active Problems:   Opioid use disorder (HCC)   Arthritis, septic, knee (HCC)   Tenosynovitis of left ankle   Paraspinal abscess [facility])  Mr. Costlow an unhoused67 year old malewho injects drugs and has bipolar disorder and MDD who presented with right knee septic arthritis, left ankle septic tenosynovitis, and a thoracic paraspinal abscess in the setting MSSA bacteremia. He underwent right knee and left ankle washout 5/5. Infection and pain are currently well-controlled. He will remain in the hospital for a 6-week course of IV antibiotic therapy (started 5/4). Staples were removed on 01/29/2019  MSSA bacteremia Right knee septic arthritis Left ankle septic tenosynovitis T10-T12 right paraspinal abscess Stable.  Continues to remain afebrile and reports much improvement to back pain. -No surgical intervention per neurosurgery. Recommends IR for I&D per spinal lumbar abscesses -Continue to monitor and reimage in the future -Day21/42 of Ancef -Pain control with OxyIR every 6 hours -Encourage ambulation -Weekly labs  Inflammatory arthritis:  -Negative work-up for ankylosing spondylitis. HLA-B27 negative. Thoracic/lumbar MRI did show inflammatory arthritic changes throughout the thoracic spine and bilateral sacroiliitis with  fusion of the SI joints. -Continue Toradol PRN andvoltaren gel   Substance use disorder: -Regularly uses heroin, crystal meth, and crack cocaine. Also gets daily methadone with ADS. -Continue methadone49m daily -SWfollowingfor housing situation and substance use disorder; [place]-Continue bowel regimen -Given Hep B vaccine5/8(repeat in6/873monthnd 11/8)  Bipolar disorder/major depressive disorder: -Stable onolanzapine 2.5 mg daily andsertraline 100 mg daily  Dispo: Anticipated dischargeafter completion of IV antibiotics (6/17).  AgJean RosenthalMD 01/31/2019, 6:32 AM Pager: [phone] PGY-1

## 2019-02-01 ENCOUNTER — Other Ambulatory Visit: Payer: Self-pay | Admitting: Internal Medicine

## 2019-02-01 DIAGNOSIS — B9561 Methicillin susceptible Staphylococcus aureus infection as the cause of diseases classified elsewhere: Secondary | ICD-10-CM

## 2019-02-01 MED ORDER — CEPHALEXIN 500 MG PO CAPS: 500.0000 mg | ORAL_CAPSULE | Freq: Two times a day (BID) | ORAL | 0 refills | Status: DC

## 2019-02-06 LAB — FUNGUS CULTURE WITH STAIN

## 2019-02-06 LAB — FUNGUS CULTURE RESULT

## 2019-02-06 LAB — FUNGAL ORGANISM REFLEX

## 2019-02-10 ENCOUNTER — Inpatient Hospital Stay (HOSPITAL_COMMUNITY)
Admission: EM | Admit: 2019-02-10 | Discharge: 2019-02-15 | DRG: 549 | Disposition: A | Discharge status: Home / Self Care | Payer: Self-pay | Attending: Internal Medicine | Admitting: Internal Medicine

## 2019-02-10 ENCOUNTER — Encounter (HOSPITAL_COMMUNITY): Payer: Self-pay | Admitting: Emergency Medicine

## 2019-02-10 DIAGNOSIS — F1910 Other psychoactive substance abuse, uncomplicated: Secondary | ICD-10-CM

## 2019-02-10 DIAGNOSIS — M00072 Staphylococcal arthritis, left ankle and foot: Principal | ICD-10-CM

## 2019-02-10 DIAGNOSIS — L02415 Cutaneous abscess of right lower limb: Secondary | ICD-10-CM | POA: Diagnosis present

## 2019-02-10 DIAGNOSIS — Z8249 Family history of ischemic heart disease and other diseases of the circulatory system: Secondary | ICD-10-CM

## 2019-02-10 DIAGNOSIS — F17210 Nicotine dependence, cigarettes, uncomplicated: Secondary | ICD-10-CM | POA: Diagnosis present

## 2019-02-10 DIAGNOSIS — Z79899 Other long term (current) drug therapy: Secondary | ICD-10-CM

## 2019-02-10 DIAGNOSIS — Z833 Family history of diabetes mellitus: Secondary | ICD-10-CM

## 2019-02-10 DIAGNOSIS — F332 Major depressive disorder, recurrent severe without psychotic features: Secondary | ICD-10-CM | POA: Diagnosis present

## 2019-02-10 DIAGNOSIS — R7881 Bacteremia: Secondary | ICD-10-CM | POA: Diagnosis present

## 2019-02-10 DIAGNOSIS — F1110 Opioid abuse, uncomplicated: Secondary | ICD-10-CM | POA: Diagnosis present

## 2019-02-10 DIAGNOSIS — B1920 Unspecified viral hepatitis C without hepatic coma: Secondary | ICD-10-CM | POA: Diagnosis present

## 2019-02-10 DIAGNOSIS — Z8614 Personal history of Methicillin resistant Staphylococcus aureus infection: Secondary | ICD-10-CM

## 2019-02-10 DIAGNOSIS — F1114 Opioid abuse with opioid-induced mood disorder: Secondary | ICD-10-CM | POA: Diagnosis present

## 2019-02-10 DIAGNOSIS — M659 Synovitis and tenosynovitis, unspecified: Secondary | ICD-10-CM | POA: Diagnosis present

## 2019-02-10 DIAGNOSIS — E876 Hypokalemia: Secondary | ICD-10-CM

## 2019-02-10 DIAGNOSIS — B9561 Methicillin susceptible Staphylococcus aureus infection as the cause of diseases classified elsewhere: Secondary | ICD-10-CM

## 2019-02-10 DIAGNOSIS — Z7982 Long term (current) use of aspirin: Secondary | ICD-10-CM

## 2019-02-10 DIAGNOSIS — M009 Pyogenic arthritis, unspecified: Secondary | ICD-10-CM | POA: Diagnosis present

## 2019-02-10 DIAGNOSIS — F319 Bipolar disorder, unspecified: Secondary | ICD-10-CM | POA: Diagnosis present

## 2019-02-10 DIAGNOSIS — Z818 Family history of other mental and behavioral disorders: Secondary | ICD-10-CM

## 2019-02-10 DIAGNOSIS — T402X5A Adverse effect of other opioids, initial encounter: Secondary | ICD-10-CM | POA: Diagnosis present

## 2019-02-10 DIAGNOSIS — F1590 Other stimulant use, unspecified, uncomplicated: Secondary | ICD-10-CM | POA: Diagnosis present

## 2019-02-10 DIAGNOSIS — M1711 Unilateral primary osteoarthritis, right knee: Secondary | ICD-10-CM | POA: Diagnosis present

## 2019-02-10 DIAGNOSIS — Z20828 Contact with and (suspected) exposure to other viral communicable diseases: Secondary | ICD-10-CM

## 2019-02-10 DIAGNOSIS — M65872 Other synovitis and tenosynovitis, left ankle and foot: Secondary | ICD-10-CM | POA: Diagnosis present

## 2019-02-10 DIAGNOSIS — D649 Anemia, unspecified: Secondary | ICD-10-CM

## 2019-02-10 DIAGNOSIS — Z590 Homelessness: Secondary | ICD-10-CM

## 2019-02-10 HISTORY — DX: Carrier or suspected carrier of methicillin resistant Staphylococcus aureus: Z22.322

## 2019-02-10 HISTORY — DX: Unspecified viral hepatitis C without hepatic coma: B19.20

## 2019-02-10 HISTORY — DX: Other specified abnormal immunological findings in serum: R76.89

## 2019-02-10 HISTORY — DX: Other specified abnormal immunological findings in serum: R76.8

## 2019-02-10 LAB — BASIC METABOLIC PANEL
Anion gap: 9 (ref 5–15)
BUN: 7 mg/dL (ref 6–20)
CO2: 26 mmol/L (ref 22–32)
Calcium: 8.9 mg/dL (ref 8.9–10.3)
Chloride: 101 mmol/L (ref 98–111)
Creatinine, Ser: 0.64 mg/dL (ref 0.61–1.24)
GFR calc Af Amer: >60 mL/min (ref >60)
GFR calc non Af Amer: >60 mL/min (ref >60)
Glucose, Bld: 64 mg/dL — ABNORMAL LOW (ref 70–99)
Potassium: 3.1 mmol/L — ABNORMAL LOW (ref 3.5–5.1)
Sodium: 136 mmol/L (ref 135–145)

## 2019-02-10 LAB — CBC
HCT: 33.2 % — ABNORMAL LOW (ref 39.0–52.0)
Hemoglobin: 10.4 g/dL — ABNORMAL LOW (ref 13.0–17.0)
MCH: 27.9 pg (ref 26.0–34.0)
MCHC: 31.3 g/dL (ref 30.0–36.0)
MCV: 89 fL (ref 80.0–100.0)
Platelets: 242 10*3/uL (ref 150–400)
RBC: 3.73 MIL/uL — ABNORMAL LOW (ref 4.22–5.81)
RDW: 13.6 % (ref 11.5–15.5)
WBC: 6 10*3/uL (ref 4.0–10.5)
nRBC: 0 % (ref 0.0–0.2)

## 2019-02-10 LAB — URINALYSIS, ROUTINE W REFLEX MICROSCOPIC
Bilirubin Urine: NEGATIVE
Glucose, UA: NEGATIVE mg/dL
Hgb urine dipstick: NEGATIVE
Ketones, ur: NEGATIVE mg/dL
Leukocytes,Ua: NEGATIVE
Nitrite: NEGATIVE
Protein, ur: NEGATIVE mg/dL
Specific Gravity, Urine: 1.03 (ref 1.005–1.030)
pH: 5 (ref 5.0–8.0)

## 2019-02-10 LAB — LACTIC ACID, PLASMA: Lactic Acid, Venous: 1.3 mmol/L (ref 0.5–1.9)

## 2019-02-10 MED ORDER — SODIUM CHLORIDE 0.9% FLUSH
3.0000 mL | Freq: Once | INTRAVENOUS | Status: AC
  Administered 2019-02-11: 3 mL via INTRAVENOUS

## 2019-02-10 NOTE — ED Triage Notes (Signed)
Pt states he is returning for admission to finish his antibiotic treatment, pt reports he left AMA because of requests to do Covid swab. Pt states he is now feeling worsening fatigue, diarrhea. Denies fever, n/v

## 2019-02-11 ENCOUNTER — Other Ambulatory Visit: Payer: Self-pay

## 2019-02-11 ENCOUNTER — Inpatient Hospital Stay (HOSPITAL_COMMUNITY): Payer: Self-pay

## 2019-02-11 DIAGNOSIS — F1114 Opioid abuse with opioid-induced mood disorder: Secondary | ICD-10-CM

## 2019-02-11 DIAGNOSIS — D649 Anemia, unspecified: Secondary | ICD-10-CM

## 2019-02-11 DIAGNOSIS — F332 Major depressive disorder, recurrent severe without psychotic features: Secondary | ICD-10-CM

## 2019-02-11 DIAGNOSIS — M009 Pyogenic arthritis, unspecified: Secondary | ICD-10-CM | POA: Diagnosis present

## 2019-02-11 DIAGNOSIS — F17210 Nicotine dependence, cigarettes, uncomplicated: Secondary | ICD-10-CM

## 2019-02-11 DIAGNOSIS — G061 Intraspinal abscess and granuloma: Secondary | ICD-10-CM

## 2019-02-11 DIAGNOSIS — Z8619 Personal history of other infectious and parasitic diseases: Secondary | ICD-10-CM

## 2019-02-11 DIAGNOSIS — R011 Cardiac murmur, unspecified: Secondary | ICD-10-CM

## 2019-02-11 DIAGNOSIS — F1590 Other stimulant use, unspecified, uncomplicated: Secondary | ICD-10-CM

## 2019-02-11 DIAGNOSIS — B9561 Methicillin susceptible Staphylococcus aureus infection as the cause of diseases classified elsewhere: Secondary | ICD-10-CM

## 2019-02-11 DIAGNOSIS — M25572 Pain in left ankle and joints of left foot: Secondary | ICD-10-CM

## 2019-02-11 DIAGNOSIS — Z590 Homelessness: Secondary | ICD-10-CM

## 2019-02-11 DIAGNOSIS — R7881 Bacteremia: Secondary | ICD-10-CM

## 2019-02-11 DIAGNOSIS — Z8739 Personal history of other diseases of the musculoskeletal system and connective tissue: Secondary | ICD-10-CM

## 2019-02-11 LAB — SARS CORONAVIRUS 2 BY RT PCR (HOSPITAL ORDER, PERFORMED IN ~~LOC~~ HOSPITAL LAB): SARS Coronavirus 2: NEGATIVE

## 2019-02-11 LAB — GLUCOSE, CAPILLARY
Glucose-Capillary: 91 mg/dL (ref 70–99)
Glucose-Capillary: 86 mg/dL (ref 70–99)
Glucose-Capillary: 86 mg/dL (ref 70–99)
Glucose-Capillary: 79 mg/dL (ref 70–99)

## 2019-02-11 MED ORDER — SERTRALINE HCL 100 MG PO TABS
100.0000 mg | ORAL_TABLET | Freq: Every day | ORAL | Status: DC
  Administered 2019-02-11 – 2019-02-15 (×5): 100 mg via ORAL
  Filled 2019-02-11 (×5): qty 1

## 2019-02-11 MED ORDER — ENOXAPARIN SODIUM 40 MG/0.4ML ~~LOC~~ SOLN
40.0000 mg | SUBCUTANEOUS | Status: DC
  Administered 2019-02-12 – 2019-02-14 (×3): 40 mg via SUBCUTANEOUS
  Filled 2019-02-11 (×3): qty 0.4

## 2019-02-11 MED ORDER — OLANZAPINE 2.5 MG PO TABS
2.5000 mg | ORAL_TABLET | Freq: Every day | ORAL | Status: DC
  Administered 2019-02-11 – 2019-02-14 (×4): 2.5 mg via ORAL
  Filled 2019-02-11 (×5): qty 1

## 2019-02-11 MED ORDER — SENNOSIDES-DOCUSATE SODIUM 8.6-50 MG PO TABS
1.0000 | ORAL_TABLET | Freq: Two times a day (BID) | ORAL | Status: DC
  Administered 2019-02-11 – 2019-02-15 (×9): 1 via ORAL
  Filled 2019-02-11 (×9): qty 1

## 2019-02-11 MED ORDER — ACETAMINOPHEN 325 MG PO TABS: 650.0000 mg | ORAL_TABLET | Freq: Four times a day (QID) | ORAL | Status: DC | PRN

## 2019-02-11 MED ORDER — CEFAZOLIN SODIUM-DEXTROSE 1-4 GM/50ML-% IV SOLN
1.0000 g | Freq: Once | INTRAVENOUS | Status: AC
  Administered 2019-02-11: 1 g via INTRAVENOUS
  Filled 2019-02-11: qty 50

## 2019-02-11 MED ORDER — CEFAZOLIN SODIUM-DEXTROSE 2-4 GM/100ML-% IV SOLN
2.0000 g | Freq: Three times a day (TID) | INTRAVENOUS | Status: DC
  Administered 2019-02-11 – 2019-02-15 (×13): 2 g via INTRAVENOUS
  Filled 2019-02-11 (×15): qty 100

## 2019-02-11 MED ORDER — POLYETHYLENE GLYCOL 3350 17 G PO PACK
17.0000 g | PACK | Freq: Every day | ORAL | Status: DC
  Administered 2019-02-11 – 2019-02-14 (×3): 17 g via ORAL
  Filled 2019-02-11 (×4): qty 1

## 2019-02-11 MED ORDER — ONDANSETRON HCL 4 MG PO TABS: 4.0000 mg | ORAL_TABLET | Freq: Four times a day (QID) | ORAL | Status: DC | PRN

## 2019-02-11 MED ORDER — OXYCODONE HCL 5 MG PO TABS: 5.0000 mg | ORAL_TABLET | Freq: Four times a day (QID) | ORAL | Status: DC | PRN

## 2019-02-11 MED ORDER — HYDROMORPHONE HCL 1 MG/ML IJ SOLN
1.0000 mg | Freq: Four times a day (QID) | INTRAMUSCULAR | Status: DC
  Administered 2019-02-11 – 2019-02-12 (×4): 1 mg via INTRAVENOUS
  Administered 2019-02-12: 4 mg via INTRAVENOUS
  Filled 2019-02-11 (×8): qty 1

## 2019-02-11 MED ORDER — POTASSIUM CHLORIDE CRYS ER 20 MEQ PO TBCR
40.0000 meq | EXTENDED_RELEASE_TABLET | Freq: Once | ORAL | Status: AC
  Administered 2019-02-11: 40 meq via ORAL
  Filled 2019-02-11: qty 2

## 2019-02-11 MED ORDER — ACETAMINOPHEN 650 MG RE SUPP: 650.0000 mg | Freq: Four times a day (QID) | RECTAL | Status: DC | PRN

## 2019-02-11 MED ORDER — OXYCODONE HCL 5 MG PO TABS: 10.0000 mg | ORAL_TABLET | ORAL | Status: DC | PRN

## 2019-02-11 MED ORDER — HYDROMORPHONE HCL 1 MG/ML IJ SOLN
1.0000 mg | INTRAMUSCULAR | Status: DC | PRN
  Administered 2019-02-11: 1 mg via INTRAVENOUS
  Filled 2019-02-11: qty 1

## 2019-02-11 MED ORDER — ONDANSETRON HCL 4 MG/2ML IJ SOLN: 4.0000 mg | Freq: Four times a day (QID) | INTRAMUSCULAR | Status: DC | PRN

## 2019-02-11 MED ORDER — METHADONE HCL 10 MG PO TABS: 70.0000 mg | ORAL_TABLET | Freq: Every day | ORAL | Status: DC

## 2019-02-11 MED ORDER — OXYCODONE HCL 5 MG PO TABS
10.0000 mg | ORAL_TABLET | Freq: Four times a day (QID) | ORAL | Status: DC | PRN
  Administered 2019-02-11 – 2019-02-15 (×8): 10 mg via ORAL
  Filled 2019-02-11 (×8): qty 2

## 2019-02-11 MED ORDER — HYDROMORPHONE HCL 1 MG/ML IJ SOLN
2.0000 mg | Freq: Once | INTRAMUSCULAR | Status: AC
  Administered 2019-02-11: 2 mg via INTRAVENOUS
  Filled 2019-02-11: qty 2

## 2019-02-11 MED ORDER — LACTATED RINGERS IV SOLN
INTRAVENOUS | Status: DC
  Administered 2019-02-11 – 2019-02-13 (×5): via INTRAVENOUS

## 2019-02-11 NOTE — Progress Notes (Signed)
Pharmacy Antibiotic Note  Codylee Patil. is a 33 y.o. male admitted on 02/10/2019 with MSSA bacteremia.  Pharmacy has been consulted for Cefazolin dosing. Pt left AMA on 5/28 after finishing approximately half of his treatment course of Cefazolin. Now back not feeling well. WBC WNL.   Plan: Re-start Cefazolin 2g IV q8h Trend WBC, temp, renal function  F/U repeat blood cultures   Height: 5\' 9"  (175.3 cm) Weight: 159 lb 13.3 oz (72.5 kg) IBW/kg (Calculated) : 70.7  Temp (24hrs), Avg:98 F (36.7 C), Min:97.8 F (36.6 C), Max:98.2 F (36.8 C)  Recent Labs  Lab 02/10/19 2134  WBC 6.0  CREATININE 0.64  LATICACIDVEN 1.3    Estimated Creatinine Clearance: 130.1 mL/min (by C-G formula based on SCr of 0.64 mg/dL).    No Known Allergies   Narda Bonds 02/11/2019 7:20 AM

## 2019-02-11 NOTE — Consult Note (Signed)
Reason for Consult:Left ankle pain Referring Physician: [name]. is an 33 y.o. male.  HPI: James Beard presented to the ED with a 2d hx/o left ankle pain. He states it had gotten better and was doing fine before this. He was previously admitted the early part of May with that same ankle infected and had an I&D and reconstruction by Dr. Lucia Gaskins. He did not get his oral antibiotics upon leaving the hospital. He has continued to inject drugs. He denies fevers, chills, sweats, N/V.  Past Medical History:  Diagnosis Date  . Bipolar 1 disorder ([place])   . Eczema   . Hepatitis B antibody positive   . Hepatitis C   . Homelessness   . IV drug abuse ([place])   . MRSA (methicillin resistant staph aureus) culture positive     Past Surgical History:  Procedure Laterality Date  . I&D EXTREMITY Bilateral 01/08/2019   Procedure: IRRIGATION AND DEBRIDEMENT OF ABSCESS RIGHT KNEE AND LEFT ANKLE;  Surgeon: Erle Crocker, MD;  Location: [facility];  Service: Orthopedics;  Laterality: Bilateral;  RIGHT KNEE AND LEFT ANKLE  . NO PAST SURGERIES    . spinal abcess      Family History  Problem Relation Age of Onset  . Diabetes Mother   . Hypertension Mother   . Depression Mother   . Diabetes Father   . Hypertension Father   . Depression Father     Social History:  reports that he has been smoking cigarettes. He has been smoking about 1.00 pack per day. He has never used smokeless tobacco. He reports previous alcohol use. He reports current drug use. Drugs: Cocaine, Marijuana, Methamphetamines, and IV.  Allergies: No Known Allergies  Medications: I have reviewed the patient's current medications.  Results for orders placed or performed during the hospital encounter of 02/10/19 (from the past 48 hour(s))  Urinalysis, Routine w reflex microscopic     Status: Abnormal   Collection Time: 02/10/19  9:21 PM  Result Value Ref Range   Color, Urine AMBER (A) YELLOW    Comment: BIOCHEMICALS MAY  BE AFFECTED BY COLOR   APPearance HAZY (A) CLEAR   Specific Gravity, Urine 1.030 1.005 - 1.030   pH 5.0 5.0 - 8.0   Glucose, UA NEGATIVE NEGATIVE mg/dL   Hgb urine dipstick NEGATIVE NEGATIVE   Bilirubin Urine NEGATIVE NEGATIVE   Ketones, ur NEGATIVE NEGATIVE mg/dL   Protein, ur NEGATIVE NEGATIVE mg/dL   Nitrite NEGATIVE NEGATIVE   Leukocytes,Ua NEGATIVE NEGATIVE    Comment: Performed at [facility] [address], [place], [place] [address]  Basic metabolic panel     Status: Abnormal   Collection Time: 02/10/19  9:34 PM  Result Value Ref Range   Sodium 136 135 - 145 mmol/L   Potassium 3.1 (L) 3.5 - 5.1 mmol/L   Chloride 101 98 - 111 mmol/L   CO2 26 22 - 32 mmol/L   Glucose, Bld 64 (L) 70 - 99 mg/dL   BUN 7 6 - 20 mg/dL   Creatinine, Ser 0.64 0.61 - 1.24 mg/dL   Calcium 8.9 8.9 - 10.3 mg/dL   GFR calc non Af Amer >60 >60 mL/min   GFR calc Af Amer >60 >60 mL/min   Anion gap 9 5 - 15    Comment: Performed at [facility], [place] [address], [place], [place] [address]  CBC     Status: Abnormal   Collection Time: 02/10/19  9:34 PM  Result Value Ref Range   WBC 6.0 4.0 - 10.5 K/uL   RBC 3.73 (L) 4.22 - 5.81 MIL/uL   Hemoglobin 10.4 (L) 13.0 - 17.0 g/dL   HCT 09.833.2 (L) 11.939.0 - 14.752.0 %   MCV 89.0 80.0 - 100.0 fL   MCH 27.9 26.0 - 34.0 pg   MCHC 31.3 30.0 - 36.0 g/dL   RDW 82.913.6 56.211.5 - 13.015.5 %   Platelets 242 150 - 400 K/uL   nRBC 0.0 0.0 - 0.2 %    Comment: Performed at [facility], [address] [place], [place] [address]  Lactic acid, plasma     Status: None   Collection Time: 02/10/19  9:34 PM  Result Value Ref Range   Lactic Acid, Venous 1.3 0.5 - 1.9 mmol/L    Comment: Performed at [facility], [address] [place], [place] [address]  SARS Coronavirus 2 (CEPHEID - Performed in [facility] hospital lab), Hosp Order     Status: None   Collection Time: 02/11/19  2:45 AM  Result Value Ref Range   SARS Coronavirus 2 NEGATIVE NEGATIVE    Comment:  (NOTE) If result is NEGATIVE SARS-CoV-2 target nucleic acids are NOT DETECTED. The SARS-CoV-2 RNA is generally detectable in upper and lower  respiratory specimens during the acute phase of infection. The lowest  concentration of SARS-CoV-2 viral copies this assay can detect is 250  copies / mL. A negative result does not preclude SARS-CoV-2 infection  and should not be used as the sole basis for treatment or other  patient management decisions.  A negative result may occur with  improper specimen collection / handling, submission of specimen other  than nasopharyngeal swab, presence of viral mutation(s) within the  areas targeted by this assay, and inadequate number of viral copies  (<250 copies / mL). A negative result must be combined with clinical  observations, patient history, and epidemiological information. If result is POSITIVE SARS-CoV-2 target nucleic acids are DETECTED. The SARS-CoV-2 RNA is generally detectable in upper and lower  respiratory specimens dur ing the acute phase of infection.  Positive  results are indicative of active infection with SARS-CoV-2.  Clinical  correlation with patient history and other diagnostic information is  necessary to determine patient infection status.  Positive results do  not rule out bacterial infection or co-infection with other viruses. If result is PRESUMPTIVE POSTIVE SARS-CoV-2 nucleic acids MAY BE PRESENT.   A presumptive positive result was obtained on the submitted specimen  and confirmed on repeat testing.  While 2019 novel coronavirus  (SARS-CoV-2) nucleic acids may be present in the submitted sample  additional confirmatory testing may be necessary for epidemiological  and / or clinical management purposes  to differentiate between  SARS-CoV-2 and other Sarbecovirus currently known to infect humans.  If clinically indicated additional testing with an alternate test  methodology [phone]([id number]) is advised. The SARS-CoV-2 RNA is  generally  detectable in upper and lower respiratory sp ecimens during the acute  phase of infection. The expected result is Negative. Fact Sheet for Patients:  BoilerBrush.com.cyhttps://www.fda.gov/media/136312/download Fact Sheet for Healthcare Providers: https://pope.com/https://www.fda.gov/media/136313/download This test is not yet approved or cleared by the [place] FDA and has been authorized for detection and/or diagnosis of SARS-CoV-2 by FDA under an Emergency Use Authorization (EUA).  This EUA will remain in effect (meaning this test can be used) for the duration of the COVID-19 declaration under Section 564(b)(1) of the Act, 21 U.S.C. section 360bbb-3(b)(1), unless the  authorization is terminated or revoked sooner. Performed at [facility], [address] [place], [place] [address]   Glucose, capillary     Status: None   Collection Time: 02/11/19  8:19 AM  Result Value Ref Range   Glucose-Capillary 91 70 - 99 mg/dL    No results found.  Review of Systems  Constitutional: Negative for chills, fever and weight loss.  HENT: Negative for ear discharge, ear pain, hearing loss and tinnitus.   Eyes: Negative for blurred vision, double vision, photophobia and pain.  Respiratory: Negative for cough, sputum production and shortness of breath.   Cardiovascular: Negative for chest pain.  Gastrointestinal: Negative for abdominal pain, nausea and vomiting.  Genitourinary: Negative for dysuria, flank pain, frequency and urgency.  Musculoskeletal: Positive for joint pain (Left ankle). Negative for back pain, falls, myalgias and neck pain.  Neurological: Negative for dizziness, tingling, sensory change, focal weakness, loss of consciousness and headaches.  Endo/Heme/Allergies: Does not bruise/bleed easily.  Psychiatric/Behavioral: Negative for depression, memory loss and substance abuse. The patient is not nervous/anxious.    Blood pressure 127/73, pulse (!) 59, temperature 97.8 F (36.6 C), temperature  source Oral, resp. rate 16, height 5\' 9"  (1.753 m), weight 72.5 kg, SpO2 100 %. Physical Exam  Constitutional: He appears well-developed and well-nourished. No distress.  HENT:  Head: Normocephalic and atraumatic.  Eyes: Conjunctivae are normal. Right eye exhibits no discharge. Left eye exhibits no discharge. No scleral icterus.  Neck: Normal range of motion.  Cardiovascular: Normal rate and regular rhythm.  Respiratory: Effort normal. No respiratory distress.  Musculoskeletal:     Comments: LLE No traumatic wounds, ecchymosis, or rash  Ankle severe TTP laterally, erythematous and edematous around previous incision  No knee effusion  Knee stable to varus/ valgus and anterior/posterior stress  Sens DPN, SPN, TN intact  Motor EHL, ext, flex, evers 5/5  DP 2+, PT 1+  Neurological: He is alert.  Skin: Skin is warm and dry. He is not diaphoretic.  Psychiatric: He has a normal mood and affect. His behavior is normal.    Assessment/Plan: Left ankle pain -- Will obtain MRI to help differentiate infection vs post-surgical pain but given the appearance and acute onset suspect the former. IVDU Bipolar d/o    Freeman CaldronMichael J. Joycelynn Fritsche, PA-C Orthopedic Surgery [phone] 02/11/2019, 10:40 AM

## 2019-02-11 NOTE — Plan of Care (Signed)

## 2019-02-11 NOTE — Progress Notes (Signed)
   Subjective: HD#0   Overnight: No acute events reported  Today, James Beard. states after his prior discharge he did not pick up antibiotics due to spending his stimulus check money 'on drugs.' He denies any fevers or chills overnight. Mentions significant tenderness and pain around his L ankle. Also endorsing significant back and knee pain. Discussed that he has pain medications ordered PRN and can request for them as needed. He mentions his social situation, including living in a tent on [address]. Discussed plan to consult ortho and most likely I&D.  Objective:  Vital signs in last 24 hours: Vitals:   02/10/19 2109 02/10/19 2110 02/11/19 0003 02/11/19 0434  BP: 124/90  125/66 127/73  Pulse: 76  69 (!) 59  Resp:   16   Temp: 98.2 F (36.8 C)  98.1 F (36.7 C) 97.8 F (36.6 C)  TempSrc: Oral  Oral Oral  SpO2: 100%  100% 100%  Weight:  72.5 kg    Height:  5\' 9"  (1.753 m)  5\' 9"  (1.753 m)   Const: Only in moderate distress when left ankle and right knee manipulated, lying comfortably in bed HEENT: Atraumatic, normocephalic Resp: CTA BL, no wheezes, crackles, rhonchi CV: RRR, no murmurs, gallop, rub Ext: Left ankle erythematous, tender to palpation    Back: No evidence of spine deviation, mid spine somewhat tender to palpation.  Assessment/Plan:  Principal Problem:   Tenosynovitis of left ankle Active Problems:   Major depressive disorder, recurrent severe without psychotic features ([place])   Opioid abuse with opioid-induced mood disorder ([place])   MSSA bacteremia   Septic arthritis ([facility])  James Beard is a 33 year old male with bipolar disorder,MDD, IV drug use, and previously admitted to [facility] (01/08/2019 - 01/31/2019) for MSSA bacteremia complicated by left ankle tenosynovitis, spetic joint of the right knee, and paraspinal abscess who presented with worsening left ankle pain and swelling. He was subsequently admitted for further management and evaluation.    MSSA Bacteremia T10-T12 Paraspinal Abscess Probable Left Ankle Septic Arthritis vs Tenosynovitis He remains afebrile, vital signs unremarkable.  CBC without leukocytosis.  He still endorses left ankle pain, right knee pain as well as some back pain.  Physical exams, left ankle is swollen, erythematous, tender to palpation.  There is no focal point tenderness at the mid thoracic vertebrae. - Continue Ancef  - Follow blood culture - Please appreciate orthopedic surgery recs - Follow-up MRI of spine - Pain control with Oxy IR 10 mg q6hr PRN and Dilaudid 1 mg q6hr PRN  - Bowel regimen: Miralax and Senokot-S QD   Substance use disorder: - OxyIR 10mg  q6 hrs, Dilaudid 1 mg q6 hr - Resume home methadone 70 mg daily after Ortho procedure  Bipolar disorder/major depressive disorder - Olanzapine 2.5 mg daily - Sertraline 100 mg daily  Hypokalemia: K+ of 3.1. - s/p K-Dur 51mEq x1 dose  FEN/GI:NPO DVT prophylaxis: Enoxaparin to start on 02/12/2019 CODE STATUS:Full  Jean Rosenthal, MD 02/11/2019, 6:39 AM Pager: [phone] Internal Medicine Teaching Service

## 2019-02-11 NOTE — Discharge Summary (Addendum)
Name: James Beard. MRN: [id number] DOB: [birth year] 33 y.o. PCP: Marliss Coots, NP  Date of Admission: 02/10/2019  9:01 PM Date of Discharge:  02/15/2019 Attending Physician: Dr. Rebeca Alert  Discharge Diagnosis: 1.  MSSA bacteremia, T10-T12 paraspinal abscess, Left ankle septic arthritis/tenosynovitis 2.  Substance use disorder 3. Bipolar disorder/major depressive disorder  Discharge Medications: Allergies as of 02/15/2019   No Known Allergies      Medication List     STOP taking these medications    methadone 10 MG/ML solution Commonly known as: DOLOPHINE Replaced by: methadone 10 MG tablet       TAKE these medications    aspirin EC 81 MG tablet Take 324 mg by mouth as needed (for headaches).   cephALEXin 500 MG capsule Commonly known as: KEFLEX Take 1 capsule (500 mg total) by mouth every 6 (six) hours for 14 days. What changed: when to take this   methadone 10 MG tablet Commonly known as: DOLOPHINE Take 7 tablets (70 mg total) by mouth daily for 7 days. Daily for pain! Replaces: methadone 10 MG/ML solution   OLANZapine 2.5 MG tablet Commonly known as: ZYPREXA Take 1 tablet (2.5 mg total) by mouth at bedtime. Notes to patient: Next dose tonight at bedtime   sertraline 100 MG tablet Commonly known as: ZOLOFT Take 1 tablet (100 mg total) by mouth daily. Notes to patient: Next dose tomorrow morning        Disposition and follow-up:   James Beard. was discharged from [facility] [place] condition.  At the hospital follow up visit please address:  1.  MSSA bacteremia, T10-T12 paraspinal abscess, Left ankle septic arthritis/tenosynovitis: Ensure compliance with Keflex 574m q6h (End date 03/01/2019)      Substance use disorder: [facility]   2.  Labs / imaging needed at time of follow-up: None   3.  Pending labs/ test needing follow-up: None   Follow-up Appointments:   Hospital Course by problem list: 1.  MSSA bacteremia, T10-T12 paraspinal abscess, Left ankle septic arthritis/tenosynovitis  James Beard a 33year old gentleman with medical history significant for bipolar disorder, major depressive disorder, substance use disorder who [facility] for MSSA bacteremia that was complicated by left ankle tenosynovitis, septic joint of the right knee and paraspinal abscess.  He was supposed to stay at the hospital for his full course of IV cefazolin for 6 weeks however he left AMA after the third week mark.  He presented again on February 11, 2019 with worsening left ankle pain and swelling.  We repeated an MRI of his spine and left ankle. MRI of his spine nearly resolving right paraspinal soft tissue abscess.  It also revealed an interval development of bone marrow edema on both sides of the disc space at T9-T10 however no signs of discitis or osteomyelitis or epidural abscess.  MRI of his left ankle showed small volume fluid in the sheath of the peroneal tendons and mild enhancement however this was noted to be markedly improved compared to prior MRI.  There was concern for reactive peroneal tenosynovitis with a possibility of osteomyelitis of the distal calcaneus and cuboid.  We consulted orthopedics, who recommended medical management. We resumed IV cefazolin.  His inflammatory markers were only mildly elevated however significantly improved from his initial labs CRP 2.5<1.3<18, ESR 38<35<83.  Given his improvement, we successfully transitioned from  IV ceftezole and to oral Keflex 500 mg every 6 hours to complete an additional 3-week course.  We assisted him to obtain medication at bedside prior to discharge.  2.  Substance use disorder: We resumed methadone 70 mg daily and OxyIR 10 mg every 6 hours for pain.  He also received breakthrough Dilaudid on hospital day 1. We attempted to discharge him with a temporary  prescription for methadone until he could establish with a new substance use clinic in [place], but we were unable to get this filled at our pharmacy. We encouraged him to return to his prior methadone clinic or [facility] for ongoing methadone treatment.   3. Bipolar disorder/major depressive disorder: We resumed olanzapine 2.5 mg daily and sertraline 100 mg daily  Discharge Vitals:   BP 123/60 (BP Location: Left Arm)   Pulse (!) 55   Temp 98.3 F (36.8 C) (Oral)   Resp 19   Ht 5' 9"  (1.753 m)   Wt 72.5 kg   SpO2 96%   BMI 23.60 kg/m   Pertinent Labs, Studies, and Procedures:   MRI THORACIC WITHOUT AND WITH CONTRAST   TECHNIQUE: Multiplanar and multiecho pulse sequences of the thoracic spine were obtained without and with intravenous contrast.   CONTRAST:  8 mL Gadovist IV   COMPARISON:  MRI thoracic spine 01/08/2019   FINDINGS: MRI THORACIC SPINE FINDINGS   Alignment:  Normal   Vertebrae: Negative for thoracic fracture.   Progressive bone marrow edema on the right above and below the disc space at T9-10. This area shows ill-defined enhancement. The disc space at this level is intact without significant disc space narrowing or endplate irregularity.   Cord:  Normal cord signal abnormality.  No cord compression.   Paraspinal and other soft tissues: Right paraspinous fluid collection at T9-10 has greatly improved. No residual fluid collection is present but there is mild soft tissue thickening in the area due to residual inflammation/edema.   Disc levels:   Multilevel disc and facet degeneration in the thoracic spine.   T7-8: Small right paracentral disc protrusion   T8-9: Disc degeneration with Schmorl's node.  Negative for stenosis   T9-10: Mild disc and mild facet degeneration. Bone marrow edema and enhancement on the right at T9-10 without evidence of disc edema or enhancement or endplate erosion. No evidence of discitis at this level.   T10-11:  Moderately large central disc protrusion mild cord flattening. No change from the prior study. Mild spinal stenosis.   IMPRESSION: Right paraspinous soft tissue abscess has nearly resolved compared with the MRI of 01/08/2019. There remains soft tissue edema and enhancement in the area consistent with resolving inflammation. No recurrent abscess.   Interval development of bone marrow edema on both sides of the disc space at T9-10 on the right. This is likely due to adjacent inflammation. No other signs of discitis or osteomyelitis or epidural abscess identified.   Thoracic degenerative changes involving disc space facet joints and costovertebral joints.   MRI OF THE LEFT ANKLE WITHOUT AND WITH CONTRAST   TECHNIQUE: Multiplanar, multisequence MR imaging of the ankle was performed before and after the administration of intravenous contrast.   CONTRAST:  8 cc Gadavist IV.   COMPARISON:  MRI left ankle 01/08/2019.   FINDINGS: TENDONS   Peroneal: There is a small volume of fluid in the sheaths of the peroneal tendons and enhancement of their sheathes. The appearance is markedly improved compared to the prior MRI. The tendons are intact.  Posteromedial: Intact.   Anterior: Intact.   Achilles: Intact.   Plantar Fascia: Intact.   LIGAMENTS   Lateral: Intact.   Medial: Intact.   CARTILAGE   Ankle Joint: Appears normal.  No joint effusion.   Subtalar Joints/Sinus Tarsi: Negative.   Bones: Mild edema and enhancement are seen in the inferior aspects of the distal calcaneus and cuboid at the calcaneocuboid joint. Trace amount of fluid is seen in the calcaneocuboid joint. Mild edema and enhancement are also seen in the periphery of the lateral malleolus.   Other: Subcutaneous tissues peripheral and posterior to the lateral malleolus are edematous and enhancing. No focal fluid collection is identified.   IMPRESSION: IMPRESSION Small volume of fluid in the sheaths  of the peroneal tendons and mild enhancement may be due to residual septic tenosynovitis but the appearance is markedly improved compared to the preoperative examination.   Mild edema in the plantar surfaces of the distal calcaneus and cuboid at the calcaneocuboid joint and in the lateral malleolus are likely reactive from peroneal tenosynovitis but could be due to osteomyelitis.   Subcutaneous edema and enhancement peripheral and posterior to the lateral malleolus is consistent with postoperative and possibly inflammatory change.   Negative for soft tissue abscess or septic joint.   Discharge Instructions: Discharge Instructions     Diet - low sodium heart healthy   Complete by: As directed    Discharge instructions   Complete by: As directed    Mr. Mcclintock,  It was a pleasure taking care of you here at the hospital.  You were admitted because of severe pain in your left ankle also back pain.  The imaging with date of your left ankle only showed a small amount of fluid.  The image of your back showed no evidence of abscess indicating that the IV antibiotics had been working.  We also check some lab works which tells [place] if there is inflammation in your body and these lab results were low.  I am discharging you with antibiotics which she will take for 2 weeks.  I have also given you a two-week prescription for methadone until you get plugged in with methadone clinic in [place].  Take care! Dr. Eileen Stanford   Increase activity slowly   Complete by: As directed        Signed: Jean Rosenthal, MD 02/16/2019, 9:11 AM   Pager: [phone] Internal Medicine Teaching Service

## 2019-02-11 NOTE — ED Provider Notes (Signed)
[facility] EMERGENCY DEPARTMENT Provider Note   CSN: [id number] Arrival date & time: 02/10/19  2039    History   Chief Complaint Chief Complaint  Patient presents with  . Fatigue    HPI James Beard. is a 33 y.o. male.   The history is provided by the patient.  He has history of bipolar disorder, substance abuse, methicillin sensitive staph aureus bacteremia and comes in because of fatigue and chills.  He had been hospitalized for management of his staph bacteremia as well as septic arthritis of his right knee and left ankle, but left AGAINST MEDICAL ADVICE on May 28.  He has been homeless since then, living in a tent.  He admits to using intravenous fentanyl and ice.  2 days ago, he started to feel fatigued and has been having chills but denies fever or sweats.  He is also noted swelling of his left ankle and pain in his left ankle.  He denies any pain in his right knee.  He states that he is willing to come back in the hospital.  Past Medical History:  Diagnosis Date  . Bipolar 1 disorder ([place])   . Eczema   . Hepatitis B antibody positive   . Hepatitis C   . Homelessness   . IV drug abuse ([place])   . MRSA (methicillin resistant staph aureus) culture positive     Patient Active Problem List   Diagnosis Date Noted  . MSSA bacteremia 01/14/2019  . Tenosynovitis of left ankle 01/08/2019  . Paraspinal abscess ([place]) 01/08/2019  . Arthritis, septic, knee ([place]) 01/07/2019  . Opioid abuse with opioid-induced mood disorder ([place]) 10/02/2018  . Opioid use disorder ([place]) 09/29/2018  . Substance induced mood disorder ([place]) 09/29/2018  . Major depressive disorder, recurrent severe without psychotic features ([place]) 09/29/2018    Past Surgical History:  Procedure Laterality Date  . I&D EXTREMITY Bilateral 01/08/2019   Procedure: IRRIGATION AND DEBRIDEMENT OF ABSCESS RIGHT KNEE AND LEFT ANKLE;  Surgeon: Erle Crocker, MD;  Location: [place];  Service:  Orthopedics;  Laterality: Bilateral;  RIGHT KNEE AND LEFT ANKLE  . NO PAST SURGERIES    . spinal abcess          Home Medications    Prior to Admission medications   Medication Sig Start Date End Date Taking? Authorizing Provider  aspirin EC 81 MG tablet Take 324 mg by mouth as needed (for headaches).    [provider]  cephALEXin (KEFLEX) 500 MG capsule Take 1 capsule (500 mg total) by mouth 2 (two) times daily for 19 days. 02/01/19 02/20/19  Jean Rosenthal, MD  methadone (DOLOPHINE) 10 MG/ML solution Take 69 mg by mouth daily.    [provider]  ondansetron (ZOFRAN ODT) 4 MG disintegrating tablet Take 1 tablet (4 mg total) by mouth every 8 (eight) hours as needed for nausea or vomiting. Patient not taking: Reported on 01/09/2019 10/22/18   Carlisle Cater, PA-C    Family History Family History  Problem Relation Age of Onset  . Diabetes Mother   . Hypertension Mother   . Depression Mother   . Diabetes Father   . Hypertension Father   . Depression Father     Social History Social History   Tobacco Use  . Smoking status: Current Every Day Smoker    Packs/day: 1.00    Types: Cigarettes  . Smokeless tobacco: Never Used  Substance Use Topics  . Alcohol use: Not Currently  .  Drug use: Yes    Types: Cocaine, Marijuana, Methamphetamines, IV     Allergies   Patient has no known allergies.   Review of Systems Review of Systems  All other systems reviewed and are negative.    Physical Exam Updated Vital Signs BP 125/66 (BP Location: Right Arm)   Pulse 69   Temp 98.1 F (36.7 C) (Oral)   Resp 16   Ht 5\' 9"  (1.753 m)   Wt 72.5 kg   SpO2 100%   BMI 23.60 kg/m   Physical Exam Vitals signs and nursing note reviewed.    33 year old male, resting comfortably and in no acute distress. Vital signs are normal. Oxygen saturation is 100%, which is normal. Head is normocephalic and atraumatic. PERRLA, EOMI. Oropharynx is clear. Neck is nontender and  supple without adenopathy or JVD. Back is nontender and there is no CVA tenderness. Lungs are clear without rales, wheezes, or rhonchi. Chest is nontender. Heart has regular rate and rhythm without murmur. Abdomen is soft, flat, nontender without masses or hepatosplenomegaly and peristalsis is normoactive. Extremities have no cyanosis or edema.  Left ankle has moderate swelling and erythema and warmth and is extremely tender. Skin is warm and dry without other rash. Neurologic: Mental status is normal, cranial nerves are intact, there are no motor or sensory deficits.  ED Treatments / Results  Labs (all labs ordered are listed, but only abnormal results are displayed) Labs Reviewed  BASIC METABOLIC PANEL - Abnormal; Notable for the following components:      Result Value   Potassium 3.1 (*)    Glucose, Bld 64 (*)    All other components within normal limits  CBC - Abnormal; Notable for the following components:   RBC 3.73 (*)    Hemoglobin 10.4 (*)    HCT 33.2 (*)    All other components within normal limits  URINALYSIS, ROUTINE W REFLEX MICROSCOPIC - Abnormal; Notable for the following components:   Color, Urine AMBER (*)    APPearance HAZY (*)    All other components within normal limits  CULTURE, BLOOD (ROUTINE X 2)  CULTURE, BLOOD (ROUTINE X 2)  SARS CORONAVIRUS 2 (HOSPITAL ORDER, PERFORMED IN [facility])  LACTIC ACID, PLASMA  LACTIC ACID, PLASMA   Procedures Procedures   Medications Ordered in ED Medications  sodium chloride flush (NS) 0.9 % injection 3 mL (has no administration in time range)  ceFAZolin (ANCEF) IVPB 1 g/50 mL premix (has no administration in time range)  potassium chloride SA (K-DUR) CR tablet 40 mEq (has no administration in time range)     Initial Impression / Assessment and Plan / ED Course  I have reviewed the triage vital signs and the nursing notes.  Pertinent labs & imaging results that were available during my care of the  patient were reviewed by me and considered in my medical decision making (see chart for details).  Staph bacteremia with noncompliance with medical regimen.  Substance abuse.  Probable recurrent septic arthritis of the left ankle.  Old records are reviewed confirming recent hospitalization for staph bacteremia, and leaving AGAINST MEDICAL ADVICE on May 28.  Actual diagnosis of his ankle was septic tenosynovitis.  Patient is willing to come back in the hospital.  New blood cultures are obtained, and he is started back on cefazolin.  Screening labs are unremarkable except for anemia which is actually improved over baseline, and hypokalemia.  He is given a dose of oral potassium.  Case  is discussed with Dr. Karilyn Cotaehman of the internal medicine teaching service who agrees to admit the patient.  Final Clinical Impressions(s) / ED Diagnoses   Final diagnoses:  MSSA bacteremia  Staphylococcal arthritis of left ankle (HCC)  Hypokalemia  Normochromic normocytic anemia  Polysubstance abuse [facility](HCC)    ED Discharge Orders    None       Dione BoozeGlick, Faiga Stones, MD 02/11/19 0300

## 2019-02-11 NOTE — H&P (Signed)
Date: 02/11/2019               Patient Name:  James BromeCameron R Oldaker Jr. MRN: [id number]  DOB: 1985-09-19 Age / Sex: 33 y.o., male   PCP: Placey, Chales AbrahamsMary Ann, Beard         Medical Service: Internal Medicine Teaching Service         Attending Physician: Dr. Sandre Kittyaines, James MochaAlexander N, MD    First Contact: Dr. Dortha SchwalbeAgyei Pager: [phone]  Second Contact: Dr. Crista ElliotHarbrecht Pager: [phone]       After Hours (After 5p/  First Contact Pager: [phone]  weekends / holidays): Second Contact Pager: [phone]   Chief Complaint: Left ankle pain  History of Present Illness: James Beard is a 33 year old male with bipolar disorder, MDD, MSSA bacteremia and IV drug user who presented to the ED with 2 days of progressive left ankle pain. History was obtained via the patient and chart review.   James Beard [facility] for MSSA bacteremia complicated by left ankle tenosynovitis, spetic joint of the right knee, and paraspinal abscess. He was being treated with IV ancef but left the hospital after receiving 3 of 6 weeks of his abx. Unfortunately he was not able to be contacted for his trial prescription of PO abx. He states that he was doing fine until 2 days ago when his left ankle began to hurt. This pain progressed to the point that he could no longer ambulate. He has continued to use IV drugs including fentanyl and meth. In addition to the left ankle pain he is having severe back pain and states that he was told he has an abscess in his spine. He is wondering if he will have surgery this time around.   He denies fevers, chills, Beard/V, SHOB, chest pain, abdominal pain, changes in bowl or bladder habits, new neurological complaints. He has been having some fatigue.   Meds:  No current facility-administered medications on file prior to encounter.    Current Outpatient Medications on File Prior to Encounter  Medication Sig Dispense Refill  . aspirin EC 81 MG tablet Take 324 mg by mouth as needed (for  headaches).    . cephALEXin (KEFLEX) 500 MG capsule Take 1 capsule (500 mg total) by mouth 2 (two) times daily for 19 days. 38 capsule 0  . methadone (DOLOPHINE) 10 MG/ML solution Take 69 mg by mouth daily.    . ondansetron (ZOFRAN ODT) 4 MG disintegrating tablet Take 1 tablet (4 mg total) by mouth every 8 (eight) hours as needed for nausea or vomiting. (Patient not taking: Reported on 01/09/2019) 6 tablet 0   Allergies: Allergies as of 02/10/2019  . (No Known Allergies)   Past Medical History:  Diagnosis Date  . Bipolar 1 disorder (HCC)   . Eczema   . Hepatitis B antibody positive   . Hepatitis C   . Homelessness   . IV drug abuse (HCC)   . MRSA (methicillin resistant staph aureus) culture positive    Family History  Problem Relation Age of Onset  . Diabetes Mother   . Hypertension Mother   . Depression Mother   . Diabetes Father   . Hypertension Father   . Depression Father    Social History: Unhomed. IV drug use including fentanyl and meth. Smokes 1.5 PPD. Denies EtOH use. Uses marijuana occasionally.   Review of Systems: A complete ROS was negative except as per HPI.   Physical Exam: Blood pressure 125/66, pulse 69,  temperature 98.1 F (36.7 C), temperature source Oral, resp. rate 16, height 5\' 9"  (1.753 m), weight 72.5 kg, SpO2 100 %.  General: Well nourished male in no acute distress HENT: Normocephalic, atraumatic, moist mucus membranes Pulm: Good air movement with no wheezing or crackles  CV: RRR, no murmurs, no rubs, tenderness to palpation of the thoracic spine  Abdomen: Active bowel sounds, soft, non-distended, no tenderness to palpation  Extremities: Pulses palpable in all extremities, significant swelling and erythema of the left ankle, tenderness with palpation and passive movement  Skin: Warm and dry  Neuro: Alert and oriented x 3  Assessment & Plan by Problem: Principal Problem:   Tenosynovitis of left ankle Active Problems:   Major depressive disorder,  recurrent severe without psychotic features ([place])   Opioid abuse with opioid-induced mood disorder ([place])   MSSA bacteremia   Septic arthritis ([place])  James Beard is a 33 year old male with bipolar disorder, MDD, IV drug use, and previously admitted to [facility] (01/08/2019 - 01/31/2019) for MSSA bacteremia complicated by left ankle tenosynovitis, spetic joint of the right knee, and paraspinal abscess who presented with worsening left ankle pain and swelling. He was subsequently admitted for further management and evaluation.   MSSA Bacteremia T10-T12 Paraspinal Abscess Probable Left Ankle Septic Arthritis vs Tenosynovitis - Restart Ancef  - Check Blood cultures - Will need to consult orthopedic surgery for possible washout of the left ankle  - Will get MRI of the thoracic spine and may need IR evaluation for possible drainage  - Pain control with Oxy IR 10 mg q4hr PRN and Dilaudid 1 mg q4hr PRN  - Bowel regimen: Miralax and Senokot-S QD   Substance use disorder: - Treating current pain with opiates. Would plan to restart methadone while here  Bipolar disorder/major depressive disorder - Restart olanzapine 2.5 mg daily - Restart sertraline 100 mg daily  FEN/GI: NPO DVT prophylaxis: Enoxaparin to start on 02/12/2019 CODE STATUS: Full  Dispo: Admit patient to Inpatient with expected length of stay greater than 2 midnights.  Signed: Ina Homes, MD 02/11/2019, 3:36 AM

## 2019-02-11 NOTE — ED Notes (Signed)
ED TO INPATIENT HANDOFF REPORT  ED Nurse Name and Phone #: [phone] Levander CampionBrittany  S Name/Age/Gender James Beard Jr. 33 y.o. male Room/Bed: [id number]  Code Status   Code Status: Full Code  Home/SNF/Other Home Patient oriented to: self, place, time and situation Is this baseline? Yes   Triage Complete: Triage complete  Chief Complaint abscess  Triage Note Pt states he is returning for admission to finish his antibiotic treatment, pt reports he left AMA because of requests to do Covid swab. Pt states he is now feeling worsening fatigue, diarrhea. Denies fever, n/v   Allergies No Known Allergies  Level of Care/Admitting Diagnosis ED Disposition    ED Disposition Condition Comment   Admit  [facility] [[id number]]  Level of Care: Med-Surg [16]  Covid Evaluation: Screening Protocol (No Symptoms)  Diagnosis: Septic arthritis [facility](HCC) [[id number]][[id number]]  Admitting Physician: Anne ShutterAINES, ALEXANDER N [[id number]]  Attending Physician: Anne ShutterAINES, ALEXANDER N [[id number]]  Estimated length of stay: > 2 weeks  Certification:: I certify this patient will need inpatient services for at least 2 midnights  PT Class (Do Not Modify): Inpatient [101]  PT Acc Code (Do Not Modify): Private [1]       B Medical/Surgery History Past Medical History:  Diagnosis Date  . Bipolar 1 disorder (HCC)   . Eczema   . Hepatitis B antibody positive   . Hepatitis C   . Homelessness   . IV drug abuse (HCC)   . MRSA (methicillin resistant staph aureus) culture positive    Past Surgical History:  Procedure Laterality Date  . I&D EXTREMITY Bilateral 01/08/2019   Procedure: IRRIGATION AND DEBRIDEMENT OF ABSCESS RIGHT KNEE AND LEFT ANKLE;  Surgeon: Terance HartAdair, Christopher R, MD;  [facility];  Service: Orthopedics;  Laterality: Bilateral;  RIGHT KNEE AND LEFT ANKLE  . NO PAST SURGERIES    . spinal abcess       A IV Location/Drains/Wounds Patient Lines/Drains/Airways Status   Active  Line/Drains/Airways    Name:   Placement date:   Placement time:   Site:   Days:   Peripheral IV 02/11/19 Right Wrist   02/11/19    0256    Wrist   less than 1   Incision (Closed) 01/08/19 Ankle Left   01/08/19    1209     34   Incision (Closed) 01/08/19 Leg Right   01/08/19    1209     34          Intake/Output Last 24 hours No intake or output data in the 24 hours ending 02/11/19 0331  Labs/Imaging Results for orders placed or performed during the hospital encounter of 02/10/19 (from the past 48 hour(s))  Urinalysis, Routine w reflex microscopic     Status: Abnormal   Collection Time: 02/10/19  9:21 PM  Result Value Ref Range   Color, Urine AMBER (A) YELLOW    Comment: BIOCHEMICALS MAY BE AFFECTED BY COLOR   APPearance HAZY (A) CLEAR   Specific Gravity, Urine 1.030 1.005 - 1.030   pH 5.0 5.0 - 8.0   Glucose, UA NEGATIVE NEGATIVE mg/dL   Hgb urine dipstick NEGATIVE NEGATIVE   Bilirubin Urine NEGATIVE NEGATIVE   Ketones, ur NEGATIVE NEGATIVE mg/dL   Protein, ur NEGATIVE NEGATIVE mg/dL   Nitrite NEGATIVE NEGATIVE   Leukocytes,Ua NEGATIVE NEGATIVE    Comment: Performed at [facility], [address] [place], [place] [address]  Basic metabolic panel     Status: Abnormal  Collection Time: 02/10/19  9:34 PM  Result Value Ref Range   Sodium 136 135 - 145 mmol/L   Potassium 3.1 (L) 3.5 - 5.1 mmol/L   Chloride 101 98 - 111 mmol/L   CO2 26 22 - 32 mmol/L   Glucose, Bld 64 (L) 70 - 99 mg/dL   BUN 7 6 - 20 mg/dL   Creatinine, Ser 0.64 0.61 - 1.24 mg/dL   Calcium 8.9 8.9 - 10.3 mg/dL   GFR calc non Af Amer >60 >60 mL/min   GFR calc Af Amer >60 >60 mL/min   Anion gap 9 5 - 15    Comment: Performed at [facility], [place] [address], [place], [place] [address]  CBC     Status: Abnormal   Collection Time: 02/10/19  9:34 PM  Result Value Ref Range   WBC 6.0 4.0 - 10.5 K/uL   RBC 3.73 (L) 4.22 - 5.81 MIL/uL   Hemoglobin 10.4 (L) 13.0 - 17.0 g/dL   HCT 33.2 (L) 39.0 -  52.0 %   MCV 89.0 80.0 - 100.0 fL   MCH 27.9 26.0 - 34.0 pg   MCHC 31.3 30.0 - 36.0 g/dL   RDW 13.6 11.5 - 15.5 %   Platelets 242 150 - 400 K/uL   nRBC 0.0 0.0 - 0.2 %    Comment: Performed at [facility], [place] [address], [place], [place] [address]  Lactic acid, plasma     Status: None   Collection Time: 02/10/19  9:34 PM  Result Value Ref Range   Lactic Acid, Venous 1.3 0.5 - 1.9 mmol/L    Comment: Performed at [facility] [address] [place], [place] [address]   No results found.  Pending Labs [employer] (From admission, onward)    Start     Ordered   02/12/19 3220  Basic metabolic panel  Tomorrow morning,   R     02/11/19 0314   02/12/19 0500  CBC  Tomorrow morning,   R     02/11/19 0314   02/11/19 0241  SARS Coronavirus 2 (CEPHEID - Performed in [facility] hospital lab), [facility] Order  Once,   R    Question:  Rule Out  Answer:  Yes   02/11/19 0240   02/11/19 0240  Culture, blood (routine x 2)  BLOOD CULTURE X 2,   STAT     02/11/19 0240   02/10/19 2112  Lactic acid, plasma  Now then every 2 hours,   STAT     02/10/19 2111          Vitals/Pain Today's Vitals   02/10/19 2109 02/10/19 2110 02/11/19 0003 02/11/19 0328  BP: 124/90  125/66   Pulse: 76  69   Resp:   16   Temp: 98.2 F (36.8 C)  98.1 F (36.7 C)   TempSrc: Oral  Oral   SpO2: 100%  100%   Weight:  72.5 kg    Height:  5\' 9"  (1.753 m)    PainSc:  10-Worst pain ever  9     Isolation Precautions No active isolations  Medications Medications  ceFAZolin (ANCEF) IVPB 1 g/50 mL premix (1 g Intravenous New Bag/Given 02/11/19 0330)  enoxaparin (LOVENOX) injection 40 mg (has no administration in time range)  acetaminophen (TYLENOL) tablet 650 mg (has no administration in time range)    Or  acetaminophen (TYLENOL) suppository 650 mg (has no administration in time range)  lactated ringers infusion (  Intravenous New Bag/Given 02/11/19 0329)  ondansetron (ZOFRAN) tablet 4 mg (has no  administration in time range)    Or  ondansetron (ZOFRAN) injection 4 mg (has no administration in time range)  oxyCODONE (Oxy IR/ROXICODONE) immediate release tablet 10 mg (has no administration in time range)  HYDROmorphone (DILAUDID) injection 1 mg (1 mg Intravenous Given 02/11/19 0328)  sodium chloride flush (NS) 0.9 % injection 3 mL (3 mLs Intravenous Given 02/11/19 0328)  potassium chloride SA (K-DUR) CR tablet 40 mEq (40 mEq Oral Given 02/11/19 0331)    Mobility walks with person assist     Focused Assessments    R Recommendations: See Admitting Provider Note  Report given to:   Additional Notes: Swelling to L ankle, surgical incision to R knee from "surgery" for septic joint. Able to ambulate with assistance due to pain. Previously left AMA after 3 of 6 week course of antibiotics, had PICC to R upper arm that was removed when pt decided to leave; currently has 22g R wrist with antiobiotics and LR infusing

## 2019-02-12 ENCOUNTER — Inpatient Hospital Stay (HOSPITAL_COMMUNITY): Payer: Self-pay

## 2019-02-12 DIAGNOSIS — Z79899 Other long term (current) drug therapy: Secondary | ICD-10-CM

## 2019-02-12 DIAGNOSIS — F1990 Other psychoactive substance use, unspecified, uncomplicated: Secondary | ICD-10-CM

## 2019-02-12 LAB — BASIC METABOLIC PANEL
Anion gap: 8 (ref 5–15)
BUN: 6 mg/dL (ref 6–20)
CO2: 29 mmol/L (ref 22–32)
Calcium: 8.7 mg/dL — ABNORMAL LOW (ref 8.9–10.3)
Chloride: 104 mmol/L (ref 98–111)
Creatinine, Ser: 0.51 mg/dL — ABNORMAL LOW (ref 0.61–1.24)
GFR calc Af Amer: >60 mL/min (ref >60)
GFR calc non Af Amer: >60 mL/min (ref >60)
Glucose, Bld: 108 mg/dL — ABNORMAL HIGH (ref 70–99)
Potassium: 3.7 mmol/L (ref 3.5–5.1)
Sodium: 141 mmol/L (ref 135–145)

## 2019-02-12 LAB — CBC
HCT: 34.6 % — ABNORMAL LOW (ref 39.0–52.0)
Hemoglobin: 11.3 g/dL — ABNORMAL LOW (ref 13.0–17.0)
MCH: 28 pg (ref 26.0–34.0)
MCHC: 32.7 g/dL (ref 30.0–36.0)
MCV: 85.9 fL (ref 80.0–100.0)
Platelets: 250 10*3/uL (ref 150–400)
RBC: 4.03 MIL/uL — ABNORMAL LOW (ref 4.22–5.81)
RDW: 13.2 % (ref 11.5–15.5)
WBC: 5 10*3/uL (ref 4.0–10.5)
nRBC: 0 % (ref 0.0–0.2)

## 2019-02-12 MED ORDER — METHADONE HCL 10 MG PO TABS
70.0000 mg | ORAL_TABLET | Freq: Every day | ORAL | Status: DC
  Administered 2019-02-13 – 2019-02-15 (×3): 70 mg via ORAL
  Filled 2019-02-12 (×3): qty 7

## 2019-02-12 MED ORDER — LIDOCAINE HCL (PF) 2 % IJ SOLN
0.0000 mL | Freq: Once | INTRAMUSCULAR | Status: DC | PRN
  Filled 2019-02-12 (×2): qty 20

## 2019-02-12 MED ORDER — GADOBUTROL 1 MMOL/ML IV SOLN
8.0000 mL | Freq: Once | INTRAVENOUS | Status: AC | PRN
  Administered 2019-02-12: 8 mL via INTRAVENOUS

## 2019-02-12 MED ORDER — HYDROMORPHONE HCL 2 MG/ML IJ SOLN: 4.0000 mg | Freq: Once | INTRAMUSCULAR | Status: AC

## 2019-02-12 NOTE — Progress Notes (Signed)
   Subjective: HD#1   Overnight: No acute overnight events.   Today, James Beard. was examined at bedside after undergoing MRI of spine and left ankle. His only complaint was being hungry. Otherwise, denies fevers, chills, nausea or vomiting. He was updated about his management.   Objective:  Vital signs in last 24 hours: Vitals:   02/11/19 0434 02/11/19 1637 02/11/19 2048 02/12/19 0510  BP: 127/73 118/69 118/61 115/72  Pulse: (!) 59 63 65 65  Resp:  16 16 14   Temp: 97.8 F (36.6 C) 98.6 F (37 C) 98.6 F (37 C) 98.6 F (37 C)  TempSrc: Oral Oral Oral Oral  SpO2: 100% 100% 99% 100%  Weight:      Height: 5\' 9"  (1.753 m)      Const: In no apparent distress, lying comfortably in bed, conversational HEENT: Atraumatic, normocephalic Resp: CTA BL, no wheezes, crackles, rhonchi CV: RRR, no murmurs, gallop, rub Ext: Left ankle erythema much improved, non-tender to palpation. Right knee still swollen, non tender to palpation.    Assessment/Plan:  Principal Problem:   Tenosynovitis of left ankle Active Problems:   Major depressive disorder, recurrent severe without psychotic features ([place])   Opioid abuse with opioid-induced mood disorder (HCC)   MSSA bacteremia   Septic arthritis (HCC)  CameronPhillips is a 33 year old male with bipolar disorder,MDD, IV drug use, and[facility])for MSSA bacteremia complicated by left ankle tenosynovitis, spetic joint of the right knee, and paraspinal abscesswho presented with worsening left ankle pain and swelling.  MSSA Bacteremia T10-T12 Paraspinal Abscess Probable Left Ankle Septic Arthritis vs Tenosynovitis He still endorses some left ankle pain though not severe as it was prior to admission.  He did not complain of back pain during our assessment today.  On physical exams, left ankle swelling and erythema much improved.  His right knee is still swollen however minimally tender to palpation.   The MRI of his spine nearly resolving right paraspinal soft tissue abscess.  It also revealed an interval development of bone marrow edema on both sides of the disc space at T9-T10 however no signs of dexterities or osteomyelitis or epidural abscess.  MRI of his left ankle showed small volume fluid in the sheath of the peroneal tendons and mild enhancement however this was noted to be markedly improved compared to prior MRI.  There was concern for reactive peroneal tenosynovitis with a possibility of osteomyelitis of the distal calcaneus and cuboid. - Continue Ancef  - Follow blood culture - Please appreciate orthopedic surgery recs - Follow-up MRI of spine, MRI left ankle - Pain control with Oxy IR 10 mg q6hr PRN and Dilaudid 1 mg q6hr PRN  - Bowel regimen: Miralax and Senokot-S QD  Substance use disorder: - OxyIR 10mg  q6 hrs, Dilaudid 1 mg q6 hr - Continue methadone 70 mg daily   Bipolar disorder/major depressive disorder -Olanzapine 2.5 mg daily -Sertraline 100 mg daily  Hypokalemia: Resolved - s/p K-Dur 19mEq x1 dose  FEN/GI:NPO DVT prophylaxis: Enoxaparinto start on 02/12/2019 CODE STATUS:Full  Jean Rosenthal, MD 02/12/2019, 6:41 AM Pager: [phone] Internal Medicine Teaching Service

## 2019-02-13 NOTE — Progress Notes (Signed)
Pharmacy Antibiotic Note  James Beard. is a 33 y.o. male admitted on 02/10/2019 with MSSA bacteremia.  Pharmacy has been consulted for Cefazolin dosing. Pt left AMA on 5/28 after finishing approximately half of his treatment course of Cefazolin.   Scr stable, repeat blood cultures negative to date Undergoing MRI of spine and ankle   Plan: Continue Cefazolin 2g IV q8h Trend WBC, temp, renal function  F/U repeat blood cultures   Height: 5\' 9"  (175.3 cm) Weight: 159 lb 13.3 oz (72.5 kg) IBW/kg (Calculated) : 70.7  Temp (24hrs), Avg:98.5 F (36.9 C), Min:98.3 F (36.8 C), Max:98.7 F (37.1 C)  Recent Labs  Lab 02/10/19 2134 02/12/19 0326  WBC 6.0 5.0  CREATININE 0.64 0.51*  LATICACIDVEN 1.3  --     Estimated Creatinine Clearance: 130.1 mL/min (A) (by C-G formula based on SCr of 0.51 mg/dL (L)).    No Known Allergies  Thank you Anette Guarneri, PharmD [phone] 02/13/2019 10:29 AM

## 2019-02-13 NOTE — Progress Notes (Signed)
   Subjective: HD#2   Overnight: No acute events  Today, James Beard. states he is having some left ankle pain but is 'feeling okay.' Mentions significant improvement in knee and back pain. Observed able to stand up straight without difficulty. Discussed the results of MRI and about ortho's recommendation regarding no indication for surgical procedures. Discussed plan to follow up with further labs and switch to oral antibiotics from IV. James Beard expressed understanding.   Objective:  Vital signs in last 24 hours: Vitals:   02/11/19 2048 02/12/19 0510 02/12/19 2037 02/13/19 0510  BP: 118/61 115/72 112/66 126/78  Pulse: 65 65 (!) 50 (!) 51  Resp: _0 Temp: 98.6 F (37 C) 98.6 F (37 C) 98.7 F (37.1 C) 98.3 F (36.8 C)  TempSrc: Oral Oral Oral Oral  SpO2: 99% 100% 100% 99%  Weight:      Height:       Const: In no apparent distress, lying comfortably in bed, conversational HEENT: Atraumatic, normocephalic Resp: CTA BL, no wheezes, crackles, rhonchi CV: RRR, no murmurs, gallop, rub Abd: Bowel sounds present, nondistended, nontender to palpation Ext: Left ankle swelling and erythema has significantly improved, nontender to palpation.  Right knee erythema and swelling has also improved.   Assessment/Plan:  Principal Problem:   Tenosynovitis of left ankle Active Problems:   Major depressive disorder, recurrent severe without psychotic features ([place])   Opioid abuse with opioid-induced mood disorder (HCC)   MSSA bacteremia   Septic arthritis (HCC)  James Beard is a 33 year old male with bipolar disorder,MDD, IV drug use, and[facility])for MSSA bacteremia complicated by left ankle tenosynovitis, spetic joint of the right knee, and paraspinal abscesswho presented with worsening left ankle pain and swelling.  MSSA Bacteremia T10-T12 Paraspinal Abscess - resolved Hx of Left Ankle Septic Arthritis vs Tenosynovitis  s/p I&D, washout 01/07/19 Left ankle swelling, erythema and pain continues to improve daily.  Right ankle swelling and erythema has also improved.  He has remained afebrile.  His blood cultures has remained no growth to date.  We discussed checking CRP and ESR in order to possibly switch IV antibiotics to oral if he continues to make improvement.  The reason for this is because the MRI of the spine showed resolution of thoracic abscess and some improvement to left ankle tenosynovitis. -ContinueAncef -Pain control with Oxy IR 72m q6hr PRN  -Bowel regimen: Miralax and Senokot-S QD  Substance use disorder: -OxyIR 141mq6hrs, Dilaudid 1 mgq6 hr -Continue methadone 70 mg daily   Bipolar disorder/major depressive disorder -Olanzapine 2.5 mg daily -Sertraline 100 mg daily  Hypokalemia: Resolved -s/pK-Dur4035mx1 dose  FEN/GI: Regular regular diet DVT prophylaxis: Enoxaparin CODE STATUS:Full  Dispo: Anticipate discharge in 1-2 days.  AgyJean RosenthalD 02/13/2019, 6:46 AM Pager: [phone] Medicine Teaching Service

## 2019-02-14 LAB — SEDIMENTATION RATE: Sed Rate: 38 mm/hr — ABNORMAL HIGH (ref 0–16)

## 2019-02-14 LAB — C-REACTIVE PROTEIN: CRP: 2.5 mg/dL — ABNORMAL HIGH (ref <1.0)

## 2019-02-14 NOTE — Progress Notes (Signed)
   Subjective: HD#3   Overnight:No acute overnight events   Today, James Beard. was examined and evaluated at bedside this AM. Observed sleeping comfortably in bed. He denies fevers, chills, endorsing night sweats which he states is chronic. Ankle, knee and back pain continues to improve. Ankle pain is worst but not as bad as during admission. Well-managed on current pain regimen with oxycodone. Denies any difficulty with going to the bathroom.   Objective:  Vital signs in last 24 hours: Vitals:   02/13/19 0510 02/13/19 1543 02/13/19 2035 02/14/19 0444  BP: 126/78 (!) 114/57 117/63 (!) 108/51  Pulse: (!) 51 66 (!) 58 (!) 58  Resp: _0 Temp: 98.3 F (36.8 C) 97.7 F (36.5 C) 98.3 F (36.8 C) 98.2 F (36.8 C)  TempSrc: Oral Oral Oral Oral  SpO2: 99% 99% 99% 99%  Weight:      Height:       Const: In no apparent distress, lying comfortably in bed, conversational HEENT: Atraumatic, normocephalic Resp: CTA BL, no wheezes, crackles, rhonchi CV: RRR, no murmurs, gallop, rub Ext: Left ankle edema and pain improved, Right knee swelling improved     Assessment/Plan:  Principal Problem:   Tenosynovitis of left ankle Active Problems:   Major depressive disorder, recurrent severe without psychotic features (HCC)   Opioid abuse with opioid-induced mood disorder (HCC)   MSSA bacteremia   Septic arthritis (HCC)  James Beard is a 33 year old male with bipolar disorder,MDD, IV drug use, andpre[facility]/2019)for MSSA bacteremia complicated by left ankle tenosynovitis, spetic joint of the right knee, and paraspinal abscesswho presented with worsening left ankle pain and swelling.  MSSA Bacteremia T10-T12 Paraspinal Abscess - resolved Hx of Left Ankle Septic Arthritis, Tenosynovitis s/p I&D, washout 01/07/19 Ankle pain, swelling and erythema improved. He has remained afebrile. CRP 2.5<1.3<18, ESR 38<35<83 indicating great response to  antibiotics. We would like to switch to oral antibiotics given this response however barriers include recurrence of osteomyelitis and septic arthritis as we are unsure if Rollo will continue to use IV drugs after discharge and also the fact that he is homeless. Will consult ID for assistance.  -ContinueAncef -Pain control with Oxy IR 69m q6hr PRN  -Bowel regimen: Miralax and Senokot-S QD  Substance use disorder: -OxyIR 111mq6hrs, Dilaudid 1 mgq6 hr -Continuemethadone 70 mg daily   Bipolar disorder/major depressive disorder -Olanzapine 2.5 mg daily -Sertraline 100 mg daily  Hypokalemia:Resolved -s/pK-Dur4062mx1 dose  FEN/GI: Regular regular diet DVT prophylaxis: Enoxaparin CODE STATUS:Full  Dispo: Anticipate discharge in 1-2 days.  AgyJean RosenthalD 02/14/2019, 6:30 AM Pager: [phone] Medicine Teaching Service

## 2019-02-14 NOTE — TOC Initial Note (Signed)
Transition of Care (TOC) - Initial/Assessment Note    Patient Details  Name: James Beard. MRN: [id number] Date of Birth: October 03, 1985  Transition of Care [facility]) CM/SW Contact:    [place], [place] Phone Number: [phone] 02/14/2019, 1:20 PM  Clinical Narrative:                 CSW received consult for homelessness issues. CSW f/u with pt on room telephone. Introduced self, role, reason for call. Pt amenable to speaking with CSW. Pt has been living in a tent on an abandoned lot. He is unsure if his tent is still there, he was living alone in that location. Pt endorses intermittant homelessness is an issue for him. He has spoken with family and they have agreed to buy him a [employer] ticket at discharge to stay with family in [place]. Pt looks forward to having support from family and not returning to tent. CSW able to assist with transportation to [employer] station when pt stable for discharge.   When asked about substance use hx pt endorses polysubstance use, with IVDU for about 1 year. Pt active with [facility] to receive methadone and would like assistance from medical team to make referral to clinic in [place], [place]. He will f/u with his mother in regards to referral there and will update MD team. Pt feels he will have less reason to use substances when in [facility] because there "is nothing there, its a small town." We discussed follow through with clinic in [place] and utilizing any additional supports available including family and local NA groups to support him when he gets there.  CSW will follow as needed.   Expected Discharge Plan: Home/Self Care Barriers to Discharge: Continued Medical Work up   Patient Goals and CMS Choice Patient states their goals for this hospitalization and ongoing recovery are:: my plan is to go home with my family in [facility]   Choice offered to / list presented to : Patient  Expected Discharge Plan and Services Expected Discharge Plan: Home/Self  Care In-house Referral: Clinical Social Work Discharge Planning Services: DC out of service area, Follow-up appt scheduled, Medication Assistance Post Acute Care Choice: NA Living arrangements for the past 2 months: No permanent address, Homeless                           HH Arranged: NA          Prior Living Arrangements/Services Living arrangements for the past 2 months: No permanent address, Homeless Lives with:: Self Patient language and need for interpreter reviewed:: Yes(no needs) Do you feel safe going back to the place where you live?: No   plan for dc to mothers home in [facility]  Need for Family Participation in Patient Care: No (Comment) Care giver support system in place?: Yes (comment)(mother)   Criminal Activity/Legal Involvement Pertinent to Current Situation/Hospitalization: No - Comment as needed  Activities of Daily Living Home Assistive Devices/Equipment: None ADL Screening (condition at time of admission) Patient's cognitive ability adequate to safely complete daily activities?: Yes Is the patient deaf or have difficulty hearing?: No Does the patient have difficulty seeing, even when wearing glasses/contacts?: No Does the patient have difficulty concentrating, remembering, or making decisions?: No Patient able to express need for assistance with ADLs?: Yes Does the patient have difficulty dressing or bathing?: No Independently performs ADLs?: Yes (appropriate for developmental age) Does the patient have difficulty walking or climbing stairs?: Yes Weakness  of Legs: Both Weakness of Arms/Hands: None  Permission Sought/Granted Permission sought to share information with : PCP Permission granted to share information with : Yes, Verbal Permission Granted     Permission granted to share info w AGENCY: [facility]        Emotional Assessment Appearance:: Other (Comment Required(spoke with pt on phone) Attitude/Demeanor/Rapport: (spoke with pt on phone) Affect  (typically observed): (spoke with pt on phone) Orientation: : Oriented to Self, Oriented to Place, Oriented to  Time, Oriented to Situation Alcohol / Substance Use: Illicit Drugs, Tobacco Use Psych Involvement: No (comment)  Admission diagnosis:  Hypokalemia [E87.6] Polysubstance abuse (HCC) [F19.10] Normochromic normocytic anemia [D64.9] Staphylococcal arthritis of left ankle (HCC) [M00.072] MSSA bacteremia [R78.81] Patient Active Problem List   Diagnosis Date Noted  . Septic arthritis (HCC) 02/11/2019  . MSSA bacteremia 01/14/2019  . Tenosynovitis of left ankle 01/08/2019  . Paraspinal abscess (HCC) 01/08/2019  . Arthritis, septic, knee (HCC) 01/07/2019  . Opioid abuse with opioid-induced mood disorder (HCC) 10/02/2018  . Opioid use disorder (HCC) 09/29/2018  . Substance induced mood disorder (HCC) 09/29/2018  . Major depressive disorder, recurrent severe without psychotic features (HCC) 09/29/2018   PCP:  Lavinia SharpsPlacey, Mary Ann, NP Pharmacy:   [facility] - [place], [place] - [address] [place] [id number] Phone: [phone] Fax: [phone]  [facility] [place] [address] [place] [address] Phone: [phone] Fax: [phone]     Social Determinants of Health (SDOH) Interventions    Readmission Risk Interventions No flowsheet data found.

## 2019-02-15 ENCOUNTER — Telehealth: Payer: Self-pay | Admitting: *Deleted

## 2019-02-15 DIAGNOSIS — M659 Synovitis and tenosynovitis, unspecified: Secondary | ICD-10-CM

## 2019-02-15 MED ORDER — METHADONE HCL 10 MG PO TABS: 70.0000 mg | ORAL_TABLET | Freq: Every day | ORAL | 0 refills | Status: DC

## 2019-02-15 MED ORDER — SERTRALINE HCL 100 MG PO TABS: 100.0000 mg | ORAL_TABLET | Freq: Every day | ORAL | 3 refills | Status: DC

## 2019-02-15 MED ORDER — CEPHALEXIN 500 MG PO CAPS: 500.0000 mg | ORAL_CAPSULE | Freq: Four times a day (QID) | ORAL | 0 refills | Status: AC

## 2019-02-15 MED ORDER — OLANZAPINE 2.5 MG PO TABS: 2.5000 mg | ORAL_TABLET | Freq: Every day | ORAL | 3 refills | Status: DC

## 2019-02-15 MED ORDER — METHADONE HCL 10 MG PO TABS: 70.0000 mg | ORAL_TABLET | Freq: Every day | ORAL | 0 refills | Status: AC

## 2019-02-15 MED ORDER — CEPHALEXIN 500 MG PO CAPS: 1000.0000 mg | ORAL_CAPSULE | Freq: Two times a day (BID) | ORAL | 0 refills | Status: DC

## 2019-02-15 MED FILL — CEPHALEXIN 500 MG CAPSULE: 500 | 14 days supply | Qty: 56 | Fill #0

## 2019-02-15 NOTE — Telephone Encounter (Signed)
Patient is requesting call back.  States knot is in his arm where the IV was placed.  Wants to know is that normal.  Forwarding to triage nurse.

## 2019-02-15 NOTE — Telephone Encounter (Signed)
Possibly.  Depends on other symptoms.  Forwarding to Dr. Eileen Stanford as well as I believe he was planning to call the patient this afternoon.

## 2019-02-15 NOTE — Progress Notes (Signed)
   Subjective: HD#4   Overnight: No acute events reported   Today, James Beard. states he had a poor night of sleep overnight but feeling better overall. Describes significant improvement in his pain. Mentions working with physical therapy and walking around the floor without difficulty yesterday. Describes plan to head to [facility] after discharge to be with mother for family support. Grandmother will buy him bus ticket to go from [place] to [facility]. Explained to James Beard about importance of continuing his abx with oral formulation for 3 more weeks. Also discussed importance of establishing care with pain clinic and continuing his methadone treatment. He states he will contact his mother for the bus ticket as well as contacting pain clinic (@ [address]?) today to start the admission process.  Objective:  Vital signs in last 24 hours: Vitals:   02/14/19 0444 02/14/19 1415 02/14/19 2109 02/15/19 0549  BP: (!) 108/51 137/85 128/72 123/60  Pulse: (!) 58 72 63 (!) 55  Resp: 20 18 16 19   Temp: 98.2 F (36.8 C) 98.3 F (36.8 C) 98.4 F (36.9 C) 98.3 F (36.8 C)  TempSrc: Oral Oral Oral Oral  SpO2: 99% 99% 98% 96%  Weight:      Height:       Const: In no apparent distress, lying comfortably in bed, conversational HEENT: Atraumatic, normocephalic Resp: CTA BL, no wheezes, crackles, rhonchi CV: RRR, no murmurs, gallop, rub Ext: L ankle and right knee erythema and swelling unchanged   Assessment/Plan:  Principal Problem:   Tenosynovitis of left ankle Active Problems:   Major depressive disorder, recurrent severe without psychotic features (HCC)   Opioid abuse with opioid-induced mood disorder (HCC)   MSSA bacteremia   Septic arthritis (HCC)  James Beard is a 33 year old male with bipolar disorder,MDD, IV drug use, andpre[facility]/2019)for MSSA bacteremia complicated by left ankle tenosynovitis, spetic joint of the right knee, and paraspinal  abscesswho presented with worsening left ankle pain and swelling.  MSSA Bacteremia T10-T12 Paraspinal Abscess- resolved Hx ofLeft Ankle Septic Arthritis, Tenosynovitiss/p I&D, washout 01/07/19 Remains afebrile. Endorses minimal left ankle pain but the ortho boot has been helping.  Given that he has had such a great improvement regarding his bacterial infection, it is therefore safe for [place] to transition to oral Keflex to continue the remainder of his antibiotic therapy.  The plan is to continue Keflex 1000 mg twice daily for 14 days discharge. -ContinueAncef -Pain control with Oxy IR 10mg  q6hr PRN  -Bowel regimen: Miralax and Senokot-S QD  Substance use disorder: At discharge, he will moving with his mother in [place] and reports that this is a safe environment that will help him not relapse into IV drug use.  He is willing to make some lifestyle modifications.  He will also find and methadone clinic while he is in [place] 10mg  q6hrs, Dilaudid 1 mgq6 hr -Continuemethadone 70 mg daily   Bipolar disorder/major depressive disorder -Olanzapine 2.5 mg daily -Sertraline 100 mg daily  Hypokalemia:Resolved -s/pK-Dur79mEq x1 dose  FEN/GI:Regular regular diet DVT prophylaxis: Enoxaparin CODE STATUS:Full  Dispo:Anticipate discharge in 1-2 days.  James Rosenthal, MD 02/15/2019, 6:29 AM Pager: [phone] Internal Medicine Teaching Service

## 2019-02-15 NOTE — Telephone Encounter (Signed)
Pt called / stated someone had tried calling him; unsure who. Informed Dr Eileen Stanford may be calling him today (per Dr Daryll Drown).

## 2019-02-15 NOTE — Progress Notes (Signed)
Discharged Pt to home, alert, oriented. Instructions given and explained. Belongings returned.

## 2019-02-15 NOTE — Plan of Care (Signed)
  Problem: Pain Managment: Goal: General experience of comfort will improve Outcome: Progressing   Problem: Safety: Goal: Ability to remain free from injury will improve Outcome: Progressing   Problem: Skin Integrity: Goal: Risk for impaired skin integrity will decrease Outcome: Progressing   

## 2019-02-15 NOTE — TOC Transition Note (Signed)
Transition of Care [facility]) - CM/SW Discharge Note   Patient Details  Name: James Beard. MRN: [id number] Date of Birth: 29-Aug-1986  [facility] CM/SW Contact:  Marilu Favre, RN Phone Number: 02/15/2019, 11:24 AM   Clinical Narrative:     Patient states his grand mother is buying him a bus ticket to [place] so he can stay with his mother at discharge. Patient just used [facility] letter in February 2020 only eligible once a year. NCM entered over ride for zero co pay for Keflex , [facility] Pharmacy will bring Keflex to patient's room prior to discharge today.   TOC does not have methadone and [facility] does not cover methadone. Patient willing to go to [place] for methadone. Called same spoke with [facility] they have methadone in stock , cost is $25.38. Patient does not have "that kind of money". Asked if his grandmother or mother could call pharmacy and pay over the phone with credit/debit card. Patient stated his mother does not have any cards and his grandmother does not like to pay for things over the phone. Patient aware NCM cannot assist with cost.   Patient stated he was told he can stay in hospital until his grandmother buys his bus ticket. NCM explained to patient he is being discharged today so he needs to call his grandmother.   Dr Eileen Stanford updated with all of above. His attending will call [facility] and pay for methadone. Dr Eileen Stanford confirmed discharge is today and he will discuss with patient.   Updated patient on above. Patient has [facility] address and telephone number and stated he is able to go pick up methadone. He will call his grandmother regarding bus ticket.   Bedside nurse aware of all of above.    Final next level of care: Home/Self Care Barriers to Discharge: No Barriers Identified   Patient Goals and CMS Choice Patient states their goals for this hospitalization and ongoing recovery are:: go to his mother's in [place] [employer] Compare Post Acute Care list provided to:: Patient Choice offered to / list presented to : NA  Discharge Placement                       Discharge Plan and Services In-house Referral: Clinical Social Work Discharge Planning Services: CM Consult Post Acute Care Choice: NA          DME Arranged: N/A         HH Arranged: NA          Social Determinants of Health (SDOH) Interventions     Readmission Risk Interventions Readmission Risk Prevention Plan 02/14/2019  Transportation Screening Complete  Medication Review Press photographer) Complete  PCP or Specialist appointment within 3-5 days of discharge Complete  HRI or [place] Not Complete  HRI or Home Care Consult Pt Refusal Comments currently no needs  SW Recovery Care/Counseling Consult Complete  Palliative Care Screening Not [place] Not Applicable  Some recent data might be hidden

## 2019-02-15 NOTE — Progress Notes (Signed)
Pt came to the front desk due a discrepancy with his discharge prescription.  MD made aware of the situation.  MD reached out to the patient and explained options to patient.

## 2019-02-16 LAB — CULTURE, BLOOD (ROUTINE X 2)
Culture: NO GROWTH
Culture: NO GROWTH
Special Requests: ADEQUATE
Special Requests: ADEQUATE

## 2019-02-16 NOTE — Telephone Encounter (Signed)
Hi Glenda,   I tried calling him twice but was unable to reach him. I will try again.   Thanks

## 2019-02-19 ENCOUNTER — Encounter (HOSPITAL_COMMUNITY): Payer: Self-pay | Admitting: Emergency Medicine

## 2019-02-19 ENCOUNTER — Other Ambulatory Visit: Payer: Self-pay

## 2019-02-19 ENCOUNTER — Emergency Department (HOSPITAL_COMMUNITY)
Admission: EM | Admit: 2019-02-19 | Discharge: 2019-02-19 | Disposition: A | Discharge status: Home / Self Care | Payer: Self-pay | Attending: Emergency Medicine | Admitting: Emergency Medicine

## 2019-02-19 DIAGNOSIS — M25572 Pain in left ankle and joints of left foot: Secondary | ICD-10-CM | POA: Insufficient documentation

## 2019-02-19 DIAGNOSIS — F1120 Opioid dependence, uncomplicated: Secondary | ICD-10-CM | POA: Insufficient documentation

## 2019-02-19 DIAGNOSIS — F1190 Opioid use, unspecified, uncomplicated: Secondary | ICD-10-CM

## 2019-02-19 DIAGNOSIS — F17210 Nicotine dependence, cigarettes, uncomplicated: Secondary | ICD-10-CM | POA: Insufficient documentation

## 2019-02-19 MED ORDER — ACETAMINOPHEN 500 MG PO TABS
1000.0000 mg | ORAL_TABLET | Freq: Once | ORAL | Status: AC
  Administered 2019-02-19: 1000 mg via ORAL
  Filled 2019-02-19: qty 2

## 2019-02-19 NOTE — ED Notes (Signed)
Pt. Ambulated to desk without help w/ steady gait. Pt. Joking about lilo and stitch as well as asking for something to drink.

## 2019-02-19 NOTE — ED Provider Notes (Signed)
[facility] EMERGENCY DEPARTMENT Provider Note   CSN: [id number] Arrival date & time: 02/19/19  1350     History   Chief Complaint Chief Complaint  Patient presents with  . Ankle Pain    HPI James Beard. is a 33 y.o. male.     HPI   33 year old male, with a significant history of opioid abuse, septic tenosynovitis of the left ankle with washout a month ago, presents with left ankle pain.  Patient states he started having ankle pain this morning.  He denies any redness, fevers, chills.  Of note patient was kicked out of his methadone clinic yesterday.  States "I am withdrawing from methadone."  He has a small area of redness to the right posterior forearm which he states is due to an IV.  He is taking his antibiotics as prescribed.  He denies any nausea, vomiting, abdominal pain.  Past Medical History:  Diagnosis Date  . Bipolar 1 disorder ([place])   . Eczema   . Hepatitis B antibody positive   . Hepatitis C   . Homelessness   . IV drug abuse ([place])   . MRSA (methicillin resistant staph aureus) culture positive     Patient Active Problem List   Diagnosis Date Noted  . Septic arthritis ([place]) 02/11/2019  . MSSA bacteremia 01/14/2019  . Tenosynovitis of left ankle 01/08/2019  . Paraspinal abscess ([place]) 01/08/2019  . Arthritis, septic, knee ([place]) 01/07/2019  . Opioid abuse with opioid-induced mood disorder ([place]) 10/02/2018  . Opioid use disorder ([place]) 09/29/2018  . Substance induced mood disorder ([place]) 09/29/2018  . Major depressive disorder, recurrent severe without psychotic features ([place]) 09/29/2018    Past Surgical History:  Procedure Laterality Date  . I&D EXTREMITY Bilateral 01/08/2019   Procedure: IRRIGATION AND DEBRIDEMENT OF ABSCESS RIGHT KNEE AND LEFT ANKLE;  Surgeon: Erle Crocker, MD;  Location: [facility];  Service: Orthopedics;  Laterality: Bilateral;  RIGHT KNEE AND LEFT ANKLE  . NO PAST SURGERIES    . spinal abcess           Home Medications    Prior to Admission medications   Medication Sig Start Date End Date Taking? Authorizing Provider  cephALEXin (KEFLEX) 500 MG capsule Take 1 capsule (500 mg total) by mouth every 6 (six) hours for 14 days. 02/15/19 03/01/19 Yes Agyei, Caprice Kluver, MD  methadone (DOLOPHINE) 10 MG tablet Take 7 tablets (70 mg total) by mouth daily for 7 days. Daily for pain! Patient not taking: Reported on 02/19/2019 02/16/19 02/23/19  Jean Rosenthal, MD  OLANZapine (ZYPREXA) 2.5 MG tablet Take 1 tablet (2.5 mg total) by mouth at bedtime. Patient not taking: Reported on 02/19/2019 02/15/19   Jean Rosenthal, MD  sertraline (ZOLOFT) 100 MG tablet Take 1 tablet (100 mg total) by mouth daily. Patient not taking: Reported on 02/19/2019 02/16/19   Jean Rosenthal, MD    Family History Family History  Problem Relation Age of Onset  . Diabetes Mother   . Hypertension Mother   . Depression Mother   . Diabetes Father   . Hypertension Father   . Depression Father     Social History Social History   Tobacco Use  . Smoking status: Current Every Day Smoker    Packs/day: 1.00    Types: Cigarettes  . Smokeless tobacco: Never Used  Substance Use Topics  . Alcohol use: Not Currently  . Drug use: Yes    Types: Cocaine, Marijuana, Methamphetamines, IV  Allergies   Patient has no known allergies.   Review of Systems Review of Systems  Constitutional: Negative for chills and fever.  Respiratory: Negative for shortness of breath.   Cardiovascular: Negative for chest pain.  Gastrointestinal: Negative for abdominal pain, nausea and vomiting.  Musculoskeletal: Positive for arthralgias (left ankle pain). Negative for gait problem and joint swelling.     Physical Exam Updated Vital Signs BP 134/73 (BP Location: Right Arm)   Pulse 60   Temp 97.9 F (36.6 C) (Oral)   SpO2 100%   Physical Exam Vitals signs and nursing note reviewed.  Constitutional:      Appearance: He is well-developed.  HENT:      Head: Normocephalic and atraumatic.  Eyes:     Conjunctiva/sclera: Conjunctivae normal.  Neck:     Musculoskeletal: Neck supple.  Cardiovascular:     Rate and Rhythm: Normal rate and regular rhythm.     Heart sounds: Normal heart sounds. No murmur.  Pulmonary:     Effort: Pulmonary effort is normal. No respiratory distress.     Breath sounds: Normal breath sounds. No wheezing or rales.  Abdominal:     General: Bowel sounds are normal. There is no distension.     Palpations: Abdomen is soft.     Tenderness: There is no abdominal tenderness.  Musculoskeletal: Normal range of motion.        General: No tenderness or deformity.       Feet:  Skin:    General: Skin is warm and dry.     Findings: No erythema or rash.  Neurological:     Mental Status: He is alert and oriented to person, place, and time.  Psychiatric:        Behavior: Behavior normal.      ED Treatments / Results  Labs (all labs ordered are listed, but only abnormal results are displayed) Labs Reviewed - No data to display  EKG    Radiology No results found.  Procedures Procedures (including critical care time)  Medications Ordered in ED Medications  acetaminophen (TYLENOL) tablet 1,000 mg (has no administration in time range)     Initial Impression / Assessment and Plan / ED Course  I have reviewed the triage vital signs and the nursing notes.  Pertinent labs & imaging results that were available during my care of the patient were reviewed by me and considered in my medical decision making (see chart for details).        Presents with left ankle pain.  He has a history of tenosynovitis in this ankle with washout.  He has been on antibiotics and been compliant with his antibiotics.  He is afebrile, not tachycardic.  His physical exam shows some mild tenderness but no erythema, no palpable abscess.  He is neurovascularly intact distal left foot.  No evidence of infection in the foot, no systemic  signs of infection.  Believe patient is here for pain management since he is out of his methadone clinic.  His clinical opioid withdrawal score is a 2, this is very mild and does not require any intervention.  He is p.o. tolerant, has no vomiting.  He is able to keep his antibiotics.  Encouraged follow-up outpatient.  Patient upset the provider will not write methadone but expressed understanding.  He is ready and stable for discharge.   At this time there does not appear to be any evidence of an acute emergency medical condition and the patient appears stable for discharge with  appropriate outpatient follow up.Diagnosis was discussed with patient who verbalizes understanding and is agreeable to discharge. Pt case discussed with Dr. Effie ShyWentz who agrees with my plan.   Final Clinical Impressions(s) / ED Diagnoses   Final diagnoses:  None    ED Discharge Orders    None       Rueben BashKendrick, Pershing Skidmore S, PA-C 02/19/19 Hardie Shackleton2005    Wentz, Elliott, MD 02/20/19 [id number]

## 2019-02-19 NOTE — ED Notes (Signed)
Patient verbalizes understanding of discharge instructions. Opportunity for questioning and answers were provided. Armband removed by staff, pt discharged from ED.  

## 2019-02-19 NOTE — ED Triage Notes (Signed)
Pt. Stated, they did a wash out on my left ankle a month ago and its hurting now. Started hurting earlier today.

## 2019-02-19 NOTE — Discharge Instructions (Signed)
Take Tylenol as needed for pain.  Wear CAM walker.  Elevate foot when able.  Follow-up with your primary care doctor for pain management.  Refer to handouts regarding opioid withdrawal.  Turn to the ED immediately for new or worsening symptoms or concerns, such as fevers, redness of the foot, vomiting, chest pain, shortness of breath or any concerns at all.

## 2019-02-21 LAB — ACID FAST CULTURE WITH REFLEXED SENSITIVITIES (MYCOBACTERIA)
Acid Fast Culture: NEGATIVE
Acid Fast Culture: NEGATIVE

## 2019-03-01 ENCOUNTER — Inpatient Hospital Stay: Payer: Self-pay | Admitting: Family Medicine

## 2020-02-10 ENCOUNTER — Encounter (HOSPITAL_COMMUNITY): Payer: Self-pay | Admitting: Emergency Medicine

## 2020-02-10 ENCOUNTER — Emergency Department (HOSPITAL_COMMUNITY)
Admission: EM | Admit: 2020-02-10 | Discharge: 2020-02-10 | Disposition: A | Discharge status: Home / Self Care | Payer: Self-pay | Attending: Emergency Medicine | Admitting: Emergency Medicine

## 2020-02-10 ENCOUNTER — Other Ambulatory Visit: Payer: Self-pay

## 2020-02-10 DIAGNOSIS — Z0279 Encounter for issue of other medical certificate: Secondary | ICD-10-CM | POA: Insufficient documentation

## 2020-02-10 DIAGNOSIS — Z7689 Persons encountering health services in other specified circumstances: Secondary | ICD-10-CM

## 2020-02-10 DIAGNOSIS — F1911 Other psychoactive substance abuse, in remission: Secondary | ICD-10-CM | POA: Insufficient documentation

## 2020-02-10 DIAGNOSIS — F17210 Nicotine dependence, cigarettes, uncomplicated: Secondary | ICD-10-CM | POA: Insufficient documentation

## 2020-02-10 LAB — COMPREHENSIVE METABOLIC PANEL
ALT: 24 U/L (ref 0–44)
AST: 23 U/L (ref 15–41)
Albumin: 4.1 g/dL (ref 3.5–5.0)
Alkaline Phosphatase: 103 U/L (ref 38–126)
Anion gap: 10 (ref 5–15)
BUN: 10 mg/dL (ref 6–20)
CO2: 25 mmol/L (ref 22–32)
Calcium: 9 mg/dL (ref 8.9–10.3)
Chloride: 103 mmol/L (ref 98–111)
Creatinine, Ser: 0.78 mg/dL (ref 0.61–1.24)
GFR calc Af Amer: >60 mL/min (ref >60)
GFR calc non Af Amer: >60 mL/min (ref >60)
Glucose, Bld: 98 mg/dL (ref 70–99)
Potassium: 3.9 mmol/L (ref 3.5–5.1)
Sodium: 138 mmol/L (ref 135–145)
Total Bilirubin: 0.5 mg/dL (ref 0.3–1.2)
Total Protein: 7.1 g/dL (ref 6.5–8.1)

## 2020-02-10 LAB — CBC
HCT: 44.1 % (ref 39.0–52.0)
Hemoglobin: 14.7 g/dL (ref 13.0–17.0)
MCH: 30.8 pg (ref 26.0–34.0)
MCHC: 33.3 g/dL (ref 30.0–36.0)
MCV: 92.3 fL (ref 80.0–100.0)
Platelets: 263 10*3/uL (ref 150–400)
RBC: 4.78 MIL/uL (ref 4.22–5.81)
RDW: 13 % (ref 11.5–15.5)
WBC: 8.2 10*3/uL (ref 4.0–10.5)
nRBC: 0 % (ref 0.0–0.2)

## 2020-02-10 LAB — PROTIME-INR
INR: 1 (ref 0.8–1.2)
Prothrombin Time: 12.5 seconds (ref 11.4–15.2)

## 2020-02-10 NOTE — ED Triage Notes (Signed)
Patient arrives to ED wanting to be medically cleared so he can get a job.

## 2020-02-10 NOTE — ED Provider Notes (Signed)
[facility] EMERGENCY DEPARTMENT Provider Note   CSN: [id number] Arrival date & time: 02/10/20  1052     History Chief Complaint  Patient presents with  . Medical Clearance    James Beard. is a 34 y.o. male with past medical history of bipolar 1, hepatitis B, hepatitis C, IV drug abuse, reports he has been in remission for a year and a half who presents today for evaluation of medical clearance.  He reports that he is trying to get into a 2-year residential program in [place] and was told that he needs medical clearance for his hepatitis, his spine arthritis, his ankle arthritis and blood work.  He does not have any forms with him today.  He does not have a primary care doctor.  He gave me permission to speak with the program that he is attempting to get into.  I spoke with her admissions counselor, they stated the patient needed medical clearance for these things but will run able to tell me what parameters they needed.  They did report that he needs CBC, CMP, and PT/INR.  So needs a psych eval.  I asked him where they normally send people to get this and they can be the name of the center.  Patient denies any complaints or concerns currently.  He denies SI, HI, AVH.  He denies active substance use/abuse.  He denies any current physical pains.  HPI     Past Medical History:  Diagnosis Date  . Bipolar 1 disorder ([place])   . Eczema   . Hepatitis B antibody positive   . Hepatitis C   . Homelessness   . IV drug abuse ([place])   . MRSA (methicillin resistant staph aureus) culture positive     Patient Active Problem List   Diagnosis Date Noted  . Septic arthritis ([place]) 02/11/2019  . MSSA bacteremia 01/14/2019  . Tenosynovitis of left ankle 01/08/2019  . Paraspinal abscess ([place]) 01/08/2019  . Arthritis, septic, knee ([place]) 01/07/2019  . Opioid abuse with opioid-induced mood disorder ([place]) 10/02/2018  . Opioid use disorder ([place]) 09/29/2018  . Substance induced  mood disorder ([place]) 09/29/2018  . Major depressive disorder, recurrent severe without psychotic features ([place]) 09/29/2018    Past Surgical History:  Procedure Laterality Date  . I & D EXTREMITY Bilateral 01/08/2019   Procedure: IRRIGATION AND DEBRIDEMENT OF ABSCESS RIGHT KNEE AND LEFT ANKLE;  Surgeon: Erle Crocker, MD;  Location: [facility];  Service: Orthopedics;  Laterality: Bilateral;  RIGHT KNEE AND LEFT ANKLE  . NO PAST SURGERIES    . spinal abcess         Family History  Problem Relation Age of Onset  . Diabetes Mother   . Hypertension Mother   . Depression Mother   . Diabetes Father   . Hypertension Father   . Depression Father     Social History   Tobacco Use  . Smoking status: Current Every Day Smoker    Packs/day: 1.00    Types: Cigarettes  . Smokeless tobacco: Never Used  Substance Use Topics  . Alcohol use: Not Currently  . Drug use: Yes    Types: Cocaine, Marijuana, Methamphetamines, IV    Home Medications Prior to Admission medications   Medication Sig Start Date End Date Taking? Authorizing Provider  OLANZapine (ZYPREXA) 2.5 MG tablet Take 1 tablet (2.5 mg total) by mouth at bedtime. Patient not taking: Reported on 02/19/2019 02/15/19   Jean Rosenthal, MD  sertraline (ZOLOFT)  100 MG tablet Take 1 tablet (100 mg total) by mouth daily. Patient not taking: Reported on 02/19/2019 02/16/19   Yvette Rack, MD    Allergies    Patient has no known allergies.  Review of Systems   Review of Systems  Constitutional: Negative for chills and fever.  Respiratory: Negative for cough and shortness of breath.   Cardiovascular: Negative for chest pain.  Gastrointestinal: Negative for abdominal pain.  Neurological: Negative for weakness and headaches.  All other systems reviewed and are negative.   Physical Exam Updated Vital Signs BP 129/73   Pulse (!) 56   Temp 98.4 F (36.9 C) (Oral)   Resp 16   Ht 5\' 9"  (1.753 m)   Wt 95.3 kg   SpO2 99%   BMI 31.01  kg/m   Physical Exam Vitals and nursing note reviewed.  Constitutional:      General: He is not in acute distress.    Appearance: He is well-developed. He is not diaphoretic.  HENT:     Head: Normocephalic and atraumatic.  Eyes:     General: No scleral icterus.       Right eye: No discharge.        Left eye: No discharge.     Conjunctiva/sclera: Conjunctivae normal.  Cardiovascular:     Rate and Rhythm: Bradycardia present.  Pulmonary:     Effort: Pulmonary effort is normal. No respiratory distress.     Breath sounds: No stridor.  Musculoskeletal:        General: No deformity.     Cervical back: Normal range of motion.  Skin:    General: Skin is warm and dry.  Neurological:     Mental Status: He is alert.     Motor: No abnormal muscle tone.  Psychiatric:        Mood and Affect: Mood normal.        Behavior: Behavior normal.     ED Results / Procedures / Treatments   Labs (all labs ordered are listed, but only abnormal results are displayed) Labs Reviewed  COMPREHENSIVE METABOLIC PANEL  CBC  PROTIME-INR    EKG None  Radiology No results found.  Procedures Procedures (including critical care time)  Medications Ordered in ED Medications - No data to display  ED Course  I have reviewed the triage vital signs and the nursing notes.  Pertinent labs & imaging results that were available during my care of the patient were reviewed by me and considered in my medical decision making (see chart for details).  Clinical Course as of Feb 10 2124  Mon Feb 10, 2020  1315 With patients permission I called [facility], they state he needs CBC, CMP, PT/INR, psych eval   [EH]    Clinical Course User Index [EH] 1316   MDM Rules/Calculators/A&P                     Patient is a 34 year old man who presents today requesting admission clearance for a rehabilitation work program.  With his permission I contacted the program, they requested "medical  clearance" for his hepatitis, multiple areas of arthritis, psych clearance and baseline labs.  Patient is not suicidal or homicidal.  They were unable to tell me if he needed to meet specific parameters for medical clearance or what he needed to be able to do.  The requested labs were drawn.  Patient denies any acute complaints or concerns.  After speaking with the facility  they were able to give me a name for a place for him to go to get the psychiatric evaluation, this information was passed on the patient.  He was instructed to follow-up with primary care versus wellness center and states his understanding.  Return precautions were discussed with patient who states their understanding.  At the time of discharge patient denied any unaddressed complaints or concerns.  Patient is agreeable for discharge home.  CBC, CMP, PT-INR unremarkable.   Note: Portions of this report may have been transcribed using voice recognition software. Every effort was made to ensure accuracy; however, inadvertent computerized transcription errors may be present   Final Clinical Impression(s) / ED Diagnoses Final diagnoses:  Return to work evaluation  Substance abuse in remission [facility])    Rx / DC Orders ED Discharge Orders    None       Norman Clay 02/10/20 2127    Terald Sleeper, MD 02/11/20 [phone]

## 2020-02-10 NOTE — Discharge Instructions (Addendum)
You can follow your results in my chart.    Today I spoke with the admissions coordinator at the program you are trying to get into.  The recommended calling [facility], whose information I have given you to see what options they can help you with.  Please follow up with either [facility] or [facility] for a full physical exam.

## 2020-02-10 NOTE — ED Notes (Signed)
Called pt name x4 in lobby, checked triage and lobby restrooms. Pt did not answer.

## 2020-02-10 NOTE — ED Notes (Signed)
Patient verbalizes understanding of discharge instructions. Opportunity for questioning and answers were provided. Pt discharged from ED. 

## 2020-04-05 ENCOUNTER — Emergency Department
Admission: EM | Admit: 2020-04-05 | Discharge: 2020-04-05 | Disposition: A | Discharge status: Home / Self Care | Payer: Self-pay | Attending: Emergency Medicine | Admitting: Emergency Medicine

## 2020-04-05 ENCOUNTER — Encounter: Payer: Self-pay | Admitting: Emergency Medicine

## 2020-04-05 ENCOUNTER — Other Ambulatory Visit: Payer: Self-pay

## 2020-04-05 DIAGNOSIS — K047 Periapical abscess without sinus: Secondary | ICD-10-CM | POA: Insufficient documentation

## 2020-04-05 DIAGNOSIS — F17210 Nicotine dependence, cigarettes, uncomplicated: Secondary | ICD-10-CM | POA: Insufficient documentation

## 2020-04-05 MED ORDER — AMOXICILLIN 500 MG PO CAPS
500.0000 mg | ORAL_CAPSULE | Freq: Three times a day (TID) | ORAL | 0 refills | Status: DC
Start: 2020-04-05 — End: 2020-12-29

## 2020-04-05 MED ORDER — AMOXICILLIN 500 MG PO CAPS
1000.0000 mg | ORAL_CAPSULE | Freq: Once | ORAL | Status: AC
  Administered 2020-04-05: 1000 mg via ORAL
  Filled 2020-04-05: qty 2

## 2020-04-05 MED ORDER — DEXAMETHASONE SODIUM PHOSPHATE 10 MG/ML IJ SOLN
10.0000 mg | Freq: Once | INTRAMUSCULAR | Status: AC
  Administered 2020-04-05: 10 mg via INTRAMUSCULAR
  Filled 2020-04-05: qty 1

## 2020-04-05 NOTE — Discharge Instructions (Addendum)
You were seen today for dental abscess.  You were given a steroid injection and your first dose of antibiotics in the ER.  I have provided you with a prescription for antibiotics to take 3 times daily for the next 10 days. You can also rinse with salt water gargles.

## 2020-04-05 NOTE — ED Notes (Signed)
Pt c/o painful dental abscess to left lower jaw x3 weeks. Pt states he has not been seen by dentist.

## 2020-04-05 NOTE — ED Provider Notes (Signed)
[facility] Emergency Department Provider Note ____________________________________________  Time seen: 1900  I have reviewed the triage vital signs and the nursing notes.  HISTORY  Chief Complaint  Dental Pain   HPI James Gut. is a 34 y.o. male presents to the ER today with complaint of left lower jaw pain and facial swelling.  He reports this started a week ago.  He reports a number of broken teeth and is concerned about dental abscess.  He reports he has had this in the past.  He denies fever, chills, or nausea.  He has not taken anything OTC for his symptoms.  He has no dental insurance and does not currently have a Education officer, community.  Past Medical History:  Diagnosis Date  . Bipolar 1 disorder ([facility])   . Eczema   . Hepatitis B antibody positive   . Hepatitis C   . Homelessness   . IV drug abuse ([facility])   . MRSA (methicillin resistant staph aureus) culture positive     Patient Active Problem List   Diagnosis Date Noted  . Septic arthritis ([facility]) 02/11/2019  . MSSA bacteremia 01/14/2019  . Tenosynovitis of left ankle 01/08/2019  . Paraspinal abscess ([facility]) 01/08/2019  . Arthritis, septic, knee ([facility]) 01/07/2019  . Opioid abuse with opioid-induced mood disorder ([facility]) 10/02/2018  . Opioid use disorder ([facility]) 09/29/2018  . Substance induced mood disorder ([facility]) 09/29/2018  . Major depressive disorder, recurrent severe without psychotic features ([facility]) 09/29/2018    Past Surgical History:  Procedure Laterality Date  . I & D EXTREMITY Bilateral 01/08/2019   Procedure: IRRIGATION AND DEBRIDEMENT OF ABSCESS RIGHT KNEE AND LEFT ANKLE;  Surgeon: Terance Hart, MD;  Location: [facility] OR;  Service: Orthopedics;  Laterality: Bilateral;  RIGHT KNEE AND LEFT ANKLE  . NO PAST SURGERIES    . spinal abcess      Prior to Admission medications   Medication Sig Start Date End Date Taking? Authorizing Provider  amoxicillin (AMOXIL) 500 MG capsule Take 1 capsule (500 mg  total) by mouth 3 (three) times daily. 04/05/20   Lorre Munroe, NP  OLANZapine (ZYPREXA) 2.5 MG tablet Take 1 tablet (2.5 mg total) by mouth at bedtime. Patient not taking: Reported on 02/19/2019 02/15/19   Yvette Rack, MD  sertraline (ZOLOFT) 100 MG tablet Take 1 tablet (100 mg total) by mouth daily. Patient not taking: Reported on 02/19/2019 02/16/19   Yvette Rack, MD    Allergies Patient has no known allergies.  Family History  Problem Relation Age of Onset  . Diabetes Mother   . Hypertension Mother   . Depression Mother   . Diabetes Father   . Hypertension Father   . Depression Father     Social History Social History   Tobacco Use  . Smoking status: Current Every Day Smoker    Packs/day: 1.00    Types: Cigarettes  . Smokeless tobacco: Never Used  Vaping Use  . Vaping Use: Never used  Substance Use Topics  . Alcohol use: Not Currently  . Drug use: Yes    Types: Cocaine, Marijuana, Methamphetamines, IV    Review of Systems  Constitutional: Negative for fever, chills or body aches. ENT: Positive for dental pain.  Negative for runny nose, nasal congestion, ear pain or sore throat. Cardiovascular: Negative for chest pain or chest tightness. Respiratory: Negative for cough or shortness of breath. Musculoskeletal: Positive for facial swelling. Skin: Negative for rash. Neurological: Negative for headaches, focal weakness, tingling or  numbness. ____________________________________________  PHYSICAL EXAM:  VITAL SIGNS: ED Triage Vitals  Enc Vitals Group     BP 04/05/20 1824 (!) 134/78     Pulse Rate 04/05/20 1824 66     Resp 04/05/20 1824 16     Temp 04/05/20 1824 98.5 F (36.9 C)     Temp Source 04/05/20 1824 Oral     SpO2 04/05/20 1824 97 %     Weight 04/05/20 1822 (!) 210 lb (95.3 kg)     Height 04/05/20 1822 5\' 8"  (1.727 m)     Head Circumference --      Peak Flow --      Pain Score 04/05/20 1822 7     Pain Loc --      Pain Edu? --      Excl. in GC?  --     Constitutional: Alert and oriented. Well appearing, appears in pain but in no distress. Head: Normocephalic and atraumatic. Eyes: Conjunctivae are normal. PERRL. Normal extraocular movements Mouth/Throat: Broken teeth #17, #18 with erythema of the gum. Mucous membranes are moist. Hematological/Lymphatic/Immunological: Cervical lymphadenopathy noted on the left. Cardiovascular: Normal rate, regular rhythm. Respiratory: Normal respiratory effort. No wheezes/rales/rhonchi. Neurologic: Normal speech and language. No gross focal neurologic deficits are appreciated. Skin:  Skin is warm, dry and intact. No facial swelling or erythema noted.  ____________________________________________    INITIAL IMPRESSION / ASSESSMENT AND PLAN / ED COURSE  Dental Abscess:  Decadron 10 mg IM x 1 Amoxicillin 1000 mg PO x 1 RX for Amoxil 500 mg BID x 10 days Encouraged salt water gargles Follow up with [facility] ____________________________________________  FINAL CLINICAL IMPRESSION(S) / ED DIAGNOSES  Final diagnoses:  Dental abscess      [facility], NP 04/05/20 06/05/20, MD 04/05/20 2059

## 2020-04-05 NOTE — ED Triage Notes (Signed)
Here for dental pain to left lower tooth. No fever. Pain X couple weeks. Does not have dentis.

## 2020-04-15 ENCOUNTER — Ambulatory Visit (HOSPITAL_COMMUNITY): Payer: Self-pay | Admitting: Psychiatry

## 2020-07-15 ENCOUNTER — Encounter (HOSPITAL_COMMUNITY): Payer: Self-pay

## 2020-07-15 ENCOUNTER — Emergency Department (HOSPITAL_COMMUNITY)
Admission: EM | Admit: 2020-07-15 | Discharge: 2020-07-15 | Disposition: A | Discharge status: Home / Self Care | Payer: Self-pay | Attending: Emergency Medicine | Admitting: Emergency Medicine

## 2020-07-15 DIAGNOSIS — M549 Dorsalgia, unspecified: Secondary | ICD-10-CM | POA: Insufficient documentation

## 2020-07-15 DIAGNOSIS — F17210 Nicotine dependence, cigarettes, uncomplicated: Secondary | ICD-10-CM | POA: Insufficient documentation

## 2020-07-15 DIAGNOSIS — G8929 Other chronic pain: Secondary | ICD-10-CM | POA: Insufficient documentation

## 2020-07-15 DIAGNOSIS — R1012 Left upper quadrant pain: Secondary | ICD-10-CM | POA: Insufficient documentation

## 2020-07-15 DIAGNOSIS — K5900 Constipation, unspecified: Secondary | ICD-10-CM | POA: Insufficient documentation

## 2020-07-15 DIAGNOSIS — I10 Essential (primary) hypertension: Secondary | ICD-10-CM

## 2020-07-15 LAB — CBC WITH DIFFERENTIAL/PLATELET
Abs Immature Granulocytes: 0.02 10*3/uL (ref 0.00–0.07)
Basophils Absolute: 0 10*3/uL (ref 0.0–0.1)
Basophils Relative: 0 %
Eosinophils Absolute: 0.1 10*3/uL (ref 0.0–0.5)
Eosinophils Relative: 2 %
HCT: 41.4 % (ref 39.0–52.0)
Hemoglobin: 13.3 g/dL (ref 13.0–17.0)
Immature Granulocytes: 0 %
Lymphocytes Relative: 26 %
Lymphs Abs: 1.4 10*3/uL (ref 0.7–4.0)
MCH: 29.6 pg (ref 26.0–34.0)
MCHC: 32.1 g/dL (ref 30.0–36.0)
MCV: 92 fL (ref 80.0–100.0)
Monocytes Absolute: 0.5 10*3/uL (ref 0.1–1.0)
Monocytes Relative: 8 %
Neutro Abs: 3.5 10*3/uL (ref 1.7–7.7)
Neutrophils Relative %: 64 %
Platelets: 257 10*3/uL (ref 150–400)
RBC: 4.5 MIL/uL (ref 4.22–5.81)
RDW: 13.2 % (ref 11.5–15.5)
WBC: 5.5 10*3/uL (ref 4.0–10.5)
nRBC: 0 % (ref 0.0–0.2)

## 2020-07-15 LAB — LIPASE, BLOOD: Lipase: 30 U/L (ref 11–51)

## 2020-07-15 LAB — COMPREHENSIVE METABOLIC PANEL
ALT: 24 U/L (ref 0–44)
AST: 26 U/L (ref 15–41)
Albumin: 3.8 g/dL (ref 3.5–5.0)
Alkaline Phosphatase: 118 U/L (ref 38–126)
Anion gap: 9 (ref 5–15)
BUN: 13 mg/dL (ref 6–20)
CO2: 25 mmol/L (ref 22–32)
Calcium: 9.2 mg/dL (ref 8.9–10.3)
Chloride: 105 mmol/L (ref 98–111)
Creatinine, Ser: 0.71 mg/dL (ref 0.61–1.24)
GFR, Estimated: >60 mL/min (ref >60)
Glucose, Bld: 97 mg/dL (ref 70–99)
Potassium: 4.1 mmol/L (ref 3.5–5.1)
Sodium: 139 mmol/L (ref 135–145)
Total Bilirubin: 0.4 mg/dL (ref 0.3–1.2)
Total Protein: 6.9 g/dL (ref 6.5–8.1)

## 2020-07-15 NOTE — ED Notes (Signed)
Patient verbalizes understanding of discharge instructions. Opportunity for questioning and answers were provided. Armband removed by staff, pt discharged from ED and ambulated to lobby to return home with family.  

## 2020-07-15 NOTE — ED Triage Notes (Signed)
Patient arrives stating his dad told him to come and get his Liver enzyems checked for hep C. Pt has no specific sx  And to get an EKG done for the meth clinic; pt states abdominal pain at 5/10 on arrival; pt has no other complaints -[facility]

## 2020-07-15 NOTE — ED Provider Notes (Signed)
[facility] EMERGENCY DEPARTMENT Provider Note   CSN: [id number] Arrival date & time: 07/15/20  0542     History Chief Complaint  Patient presents with  . Abdominal Pain    James Beard. is a 34 y.o. male.  He has a history of hep B and hep C and states he is here to get his liver enzymes checked.  He says he usually does this every 6 months.  He also needs an EKG per his methadone clinic to check his intervals.  He is complaining of some vague left-sided abdominal pain that comes and goes.  He also has some chronic back pain.  No fever chills jaundice.  The history is provided by the patient.  Abdominal Pain Pain location:  LUQ Pain quality: cramping   Pain radiates to:  Does not radiate Pain severity:  Mild Onset quality:  Gradual Timing:  Intermittent Progression:  Unchanged Relieved by:  None tried Worsened by:  Nothing Ineffective treatments:  None tried Associated symptoms: constipation   Associated symptoms: no chest pain, no cough, no diarrhea, no dysuria, no fever, no hematemesis, no hematochezia, no hematuria, no nausea, no shortness of breath, no sore throat and no vomiting        Past Medical History:  Diagnosis Date  . Bipolar 1 disorder (HCC)   . Eczema   . Hepatitis B antibody positive   . Hepatitis C   . Homelessness   . IV drug abuse (HCC)   . MRSA (methicillin resistant staph aureus) culture positive     Patient Active Problem List   Diagnosis Date Noted  . Septic arthritis (HCC) 02/11/2019  . MSSA bacteremia 01/14/2019  . Tenosynovitis of left ankle 01/08/2019  . Paraspinal abscess (HCC) 01/08/2019  . Arthritis, septic, knee (HCC) 01/07/2019  . Opioid abuse with opioid-induced mood disorder (HCC) 10/02/2018  . Opioid use disorder 09/29/2018  . Substance induced mood disorder (HCC) 09/29/2018  . Major depressive disorder, recurrent severe without psychotic features (HCC) 09/29/2018    Past Surgical History:    Procedure Laterality Date  . I & D EXTREMITY Bilateral 01/08/2019   Procedure: IRRIGATION AND DEBRIDEMENT OF ABSCESS RIGHT KNEE AND LEFT ANKLE;  Surgeon: Terance Hart, MD;  Location: [facility] OR;  Service: Orthopedics;  Laterality: Bilateral;  RIGHT KNEE AND LEFT ANKLE  . NO PAST SURGERIES    . spinal abcess         Family History  Problem Relation Age of Onset  . Diabetes Mother   . Hypertension Mother   . Depression Mother   . Diabetes Father   . Hypertension Father   . Depression Father     Social History   Tobacco Use  . Smoking status: Current Every Day Smoker    Packs/day: 1.00    Types: Cigarettes  . Smokeless tobacco: Never Used  Vaping Use  . Vaping Use: Never used  Substance Use Topics  . Alcohol use: Not Currently  . Drug use: Yes    Types: Cocaine, Marijuana, Methamphetamines, IV    Home Medications Prior to Admission medications   Medication Sig Start Date End Date Taking? Authorizing Provider  amoxicillin (AMOXIL) 500 MG capsule Take 1 capsule (500 mg total) by mouth 3 (three) times daily. 04/05/20   Lorre Munroe, NP  OLANZapine (ZYPREXA) 2.5 MG tablet Take 1 tablet (2.5 mg total) by mouth at bedtime. Patient not taking: Reported on 02/19/2019 02/15/19   Yvette Rack, MD  sertraline (ZOLOFT)  100 MG tablet Take 1 tablet (100 mg total) by mouth daily. Patient not taking: Reported on 02/19/2019 02/16/19   Yvette Rack, MD    Allergies    Patient has no known allergies.  Review of Systems   Review of Systems  Constitutional: Negative for fever.  HENT: Negative for sore throat.   Eyes: Negative for visual disturbance.  Respiratory: Negative for cough and shortness of breath.   Cardiovascular: Negative for chest pain.  Gastrointestinal: Positive for abdominal pain and constipation. Negative for diarrhea, hematemesis, hematochezia, nausea and vomiting.  Genitourinary: Negative for dysuria and hematuria.  Musculoskeletal: Positive for back pain.  Skin:  Negative for rash.  Neurological: Negative for headaches.    Physical Exam Updated Vital Signs BP (!) 150/96 (BP Location: Right Arm)   Pulse 63   Temp (!) 97.5 F (36.4 C) (Oral)   Resp 16   SpO2 95%   Physical Exam Vitals and nursing note reviewed.  Constitutional:      Appearance: Normal appearance. He is well-developed. He is not ill-appearing.  HENT:     Head: Normocephalic and atraumatic.  Eyes:     Conjunctiva/sclera: Conjunctivae normal.  Cardiovascular:     Rate and Rhythm: Normal rate and regular rhythm.     Heart sounds: No murmur heard.   Pulmonary:     Effort: Pulmonary effort is normal. No respiratory distress.     Breath sounds: Normal breath sounds.  Abdominal:     Palpations: Abdomen is soft.     Tenderness: There is no abdominal tenderness. There is no guarding or rebound.  Musculoskeletal:        General: No deformity or signs of injury. Normal range of motion.     Cervical back: Neck supple.  Skin:    General: Skin is warm and dry.     Capillary Refill: Capillary refill takes less than 2 seconds.  Neurological:     General: No focal deficit present.     Mental Status: He is alert.     Gait: Gait normal.     ED Results / Procedures / Treatments   Labs (all labs ordered are listed, but only abnormal results are displayed) Labs Reviewed  COMPREHENSIVE METABOLIC PANEL  CBC WITH DIFFERENTIAL/PLATELET  LIPASE, BLOOD    EKG EKG Interpretation  Date/Time:  Wednesday July 15 2020 08:34:00 EST Ventricular Rate:  49 PR Interval:  200 QRS Duration: 90 QT Interval:  448 QTC Calculation: 404 R Axis:   62 Text Interpretation: Sinus bradycardia Otherwise normal ECG No significant change since prior 4/20 Confirmed by Meridee Score [id number]) on 07/15/2020 8:40:49 AM   Radiology No results found.  Procedures Procedures (including critical care time)  Medications Ordered in ED Medications - No data to display  ED Course  I have reviewed  the triage vital signs and the nursing notes.  Pertinent labs & imaging results that were available during my care of the patient were reviewed by me and considered in my medical decision making (see chart for details).    MDM Rules/Calculators/A&P                           This patient complains of left-sided abdominal pain, concern for worsening liver enzymes in the setting of hepatitis B and C, needing an EKG for methadone clinic; this involves an extensive number of treatment Options and is a complaint that carries with it a high risk of complications and Morbidity.  The differential includes worsening hepatitis, arrhythmia, constipation, peptic ulcer disease, pancreatitis  I ordered, reviewed and interpreted labs, which included CBC with normal white count normal hemoglobin, chemistries normal, LFTs normal, lipase normal Previous records obtained and reviewed in epic, had LFTs done a year ago with mild elevations  After the interventions stated above, I reevaluated the patient and found patient to be minimally symptomatic asking to eat.  Tolerated p.o. without difficulty.  Blood pressure was noted to be elevated and recommended he follow-up with his PCP.  Reviewed the rest of his lab work and EKG findings with him and he is comfortable plan for outpatient follow-up.  Return instructions discussed   Final Clinical Impression(s) / ED Diagnoses Final diagnoses:  Left upper quadrant abdominal pain  Primary hypertension    Rx / DC Orders ED Discharge Orders    None       Terrilee Files, MD 07/15/20 1907

## 2020-07-15 NOTE — ED Notes (Signed)
Pt asking for something to drink but told Pt b/c of what he is here for we can not give him anything to drink. Pt stated "I'll just get water from the bathroom then"

## 2020-07-15 NOTE — Discharge Instructions (Addendum)
You were seen in the emergency department for some left-sided abdominal pain and concerns for any worsening of your hepatitis B and C.  You also requested an EKG for your methadone clinic.  Your EKG showed sinus bradycardia with normal intervals.  Your labs showed normal liver function.  Please follow-up with your regular doctor and return to the emergency department for any worsening or concerning symptoms

## 2020-12-29 ENCOUNTER — Emergency Department
Admission: EM | Admit: 2020-12-29 | Discharge: 2020-12-29 | Disposition: A | Discharge status: Home / Self Care | Payer: Self-pay | Attending: Emergency Medicine | Admitting: Emergency Medicine

## 2020-12-29 ENCOUNTER — Other Ambulatory Visit: Payer: Self-pay

## 2020-12-29 DIAGNOSIS — S2096XA Insect bite (nonvenomous) of unspecified parts of thorax, initial encounter: Secondary | ICD-10-CM | POA: Insufficient documentation

## 2020-12-29 DIAGNOSIS — F17210 Nicotine dependence, cigarettes, uncomplicated: Secondary | ICD-10-CM | POA: Insufficient documentation

## 2020-12-29 DIAGNOSIS — W57XXXA Bitten or stung by nonvenomous insect and other nonvenomous arthropods, initial encounter: Secondary | ICD-10-CM | POA: Insufficient documentation

## 2020-12-29 NOTE — ED Triage Notes (Signed)
Pt states he noticed a tick on his back while taking a shower today.

## 2020-12-29 NOTE — Discharge Instructions (Addendum)
Please follow-up with primary care or return to the ER if you develop any rash, fever or other symptoms.  Otherwise, no follow-up is required.

## 2020-12-29 NOTE — ED Provider Notes (Signed)
[facility] Emergency Department Provider Note  ____________________________________________   Event Date/Time   First MD Initiated Contact with Patient 12/29/20 1826     (approximate)  I have reviewed the triage vital signs and the nursing notes.   HISTORY  Chief Complaint Tick Removal   HPI James Beard. is a 35 y.o. male who presents to the emergency department for evaluation of tick removal.  He states he noticed it in the shower today.  States that he does not think that it could have been there longer than since yesterday.  He denies any rash, fever or other symptoms.         Past Medical History:  Diagnosis Date  . Bipolar 1 disorder (HCC)   . Eczema   . Hepatitis B antibody positive   . Hepatitis C   . Homelessness   . IV drug abuse (HCC)   . MRSA (methicillin resistant staph aureus) culture positive     Patient Active Problem List   Diagnosis Date Noted  . Septic arthritis (HCC) 02/11/2019  . MSSA bacteremia 01/14/2019  . Tenosynovitis of left ankle 01/08/2019  . Paraspinal abscess (HCC) 01/08/2019  . Arthritis, septic, knee (HCC) 01/07/2019  . Opioid abuse with opioid-induced mood disorder (HCC) 10/02/2018  . Opioid use disorder 09/29/2018  . Substance induced mood disorder (HCC) 09/29/2018  . Major depressive disorder, recurrent severe without psychotic features (HCC) 09/29/2018    Past Surgical History:  Procedure Laterality Date  . I & D EXTREMITY Bilateral 01/08/2019   Procedure: IRRIGATION AND DEBRIDEMENT OF ABSCESS RIGHT KNEE AND LEFT ANKLE;  Surgeon: Terance Hart, MD;  Location: [facility] OR;  Service: Orthopedics;  Laterality: Bilateral;  RIGHT KNEE AND LEFT ANKLE  . NO PAST SURGERIES    . spinal abcess      Prior to Admission medications   Medication Sig Start Date End Date Taking? Authorizing Provider  OLANZapine (ZYPREXA) 2.5 MG tablet Take 1 tablet (2.5 mg total) by mouth at bedtime. Patient not taking:  Reported on 02/19/2019 02/15/19   Yvette Rack, MD  sertraline (ZOLOFT) 100 MG tablet Take 1 tablet (100 mg total) by mouth daily. Patient not taking: Reported on 02/19/2019 02/16/19   Yvette Rack, MD    Allergies Patient has no known allergies.  Family History  Problem Relation Age of Onset  . Diabetes Mother   . Hypertension Mother   . Depression Mother   . Diabetes Father   . Hypertension Father   . Depression Father     Social History Social History   Tobacco Use  . Smoking status: Current Every Day Smoker    Packs/day: 1.00    Types: Cigarettes  . Smokeless tobacco: Never Used  Vaping Use  . Vaping Use: Never used  Substance Use Topics  . Alcohol use: Not Currently  . Drug use: Yes    Types: Cocaine, Marijuana, Methamphetamines, IV    Review of Systems Constitutional: No fever/chills Eyes: No visual changes. ENT: No sore throat. Cardiovascular: Denies chest pain. Respiratory: Denies shortness of breath. Gastrointestinal: No abdominal pain.  No nausea, no vomiting.  No diarrhea.  No constipation. Genitourinary: Negative for dysuria. Musculoskeletal: Negative for back pain. Skin: + Tick to back, negative for rash. Neurological: Negative for headaches, focal weakness or numbness.   ____________________________________________   PHYSICAL EXAM:  VITAL SIGNS: ED Triage Vitals  Enc Vitals Group     BP 12/29/20 1816 (!) 138/91     Pulse  Rate 12/29/20 1814 (!) 59     Resp 12/29/20 1814 17     Temp 12/29/20 1816 98.1 F (36.7 C)     Temp Source 12/29/20 1814 Oral     SpO2 12/29/20 1814 97 %     Weight 12/29/20 1814 205 lb (93 kg)     Height 12/29/20 1814 5\' 9"  (1.753 m)     Head Circumference --      Peak Flow --      Pain Score 12/29/20 1814 0     Pain Loc --      Pain Edu? --      Excl. in GC? --    Constitutional: Alert and oriented. Well appearing and in no acute distress. Eyes: Conjunctivae are normal. PERRL. EOMI. Head: Atraumatic. Nose: No  congestion/rhinnorhea. Neck: No stridor.   Neurologic:  Normal speech and language. No gross focal neurologic deficits are appreciated. No gait instability. Skin: There is a live tick noted to the mid thoracic region.  Not burrowed into the skin.  No other identifiable rash noted other than patient's baseline eczema. Psychiatric: Mood and affect are normal. Speech and behavior are normal.  ____________________________________________   PROCEDURES  Procedure(s) performed (including Critical Care):  .Foreign Body Removal  Date/Time: 12/29/2020 7:26 PM Performed by: 12/31/2020, PA Authorized by: Lucy Chris, PA  Consent: Verbal consent obtained. Risks and benefits: risks, benefits and alternatives were discussed Consent given by: patient Patient understanding: patient states understanding of the procedure being performed Patient identity confirmed: verbally with patient Body area: skin General location: trunk Location details: back  Sedation: Patient sedated: no  Patient restrained: no Removal mechanism: scalpel and forceps Depth: subcutaneous 1 objects recovered. Objects recovered: live tick Post-procedure assessment: foreign body removed Patient tolerance: patient tolerated the procedure well with no immediate complications     ____________________________________________   INITIAL IMPRESSION / ASSESSMENT AND PLAN / ED COURSE  As part of my medical decision making, I reviewed the following data within the electronic MEDICAL RECORD NUMBER Nursing notes reviewed and incorporated and Notes from prior ED visits       Patient presents to ER for evaluation of tick to the back that he cannot remove.  No other associated rash or symptoms.  Tick was removed using forceps and scalpel without difficulty.  Discussed with the patient that he should return to PCP or ER if he develops any other rash, fever or other concerning symptoms.  Patient is amenable with plan, stable  this time for outpatient management.       ____________________________________________   FINAL CLINICAL IMPRESSION(S) / ED DIAGNOSES  Final diagnoses:  Tick bite with subsequent removal of tick     ED Discharge Orders    None      *Please note:  Akili Corsetti. was evaluated in Emergency Department on 12/29/2020 for the symptoms described in the history of present illness. He was evaluated in the context of the global COVID-19 pandemic, which necessitated consideration that the patient might be at risk for infection with the SARS-CoV-2 virus that causes COVID-19. Institutional protocols and algorithms that pertain to the evaluation of patients at risk for COVID-19 are in a state of rapid change based on information released by regulatory bodies including the CDC and federal and state organizations. These policies and algorithms were followed during the patient's care in the ED.  Some ED evaluations and interventions may be delayed as a result of limited staffing during and the pandemic.*  Note:  This document was prepared using Dragon voice recognition software and may include unintentional dictation errors.   Lucy Chris, PA 12/29/20 Serena Croissant    Merwyn Katos, MD 01/03/21 [phone]

## 2020-12-31 ENCOUNTER — Encounter (HOSPITAL_COMMUNITY): Payer: Self-pay | Admitting: Emergency Medicine

## 2020-12-31 ENCOUNTER — Emergency Department (HOSPITAL_COMMUNITY)
Admission: EM | Admit: 2020-12-31 | Discharge: 2020-12-31 | Disposition: A | Discharge status: Home / Self Care | Payer: Self-pay | Attending: Emergency Medicine | Admitting: Emergency Medicine

## 2020-12-31 ENCOUNTER — Other Ambulatory Visit: Payer: Self-pay

## 2020-12-31 DIAGNOSIS — W57XXXD Bitten or stung by nonvenomous insect and other nonvenomous arthropods, subsequent encounter: Secondary | ICD-10-CM

## 2020-12-31 DIAGNOSIS — W57XXXA Bitten or stung by nonvenomous insect and other nonvenomous arthropods, initial encounter: Secondary | ICD-10-CM | POA: Insufficient documentation

## 2020-12-31 DIAGNOSIS — R21 Rash and other nonspecific skin eruption: Secondary | ICD-10-CM | POA: Insufficient documentation

## 2020-12-31 DIAGNOSIS — F17210 Nicotine dependence, cigarettes, uncomplicated: Secondary | ICD-10-CM | POA: Insufficient documentation

## 2020-12-31 DIAGNOSIS — S50861A Insect bite (nonvenomous) of right forearm, initial encounter: Secondary | ICD-10-CM | POA: Insufficient documentation

## 2020-12-31 DIAGNOSIS — S50862A Insect bite (nonvenomous) of left forearm, initial encounter: Secondary | ICD-10-CM | POA: Insufficient documentation

## 2020-12-31 MED ORDER — PREDNISONE 10 MG PO TABS
10.0000 mg | ORAL_TABLET | Freq: Every day | ORAL | 0 refills | Status: DC
Start: 2020-12-31 — End: 2021-01-21

## 2020-12-31 MED ORDER — DOXYCYCLINE HYCLATE 100 MG PO TABS: 100.0000 mg | ORAL_TABLET | Freq: Two times a day (BID) | ORAL | 0 refills | Status: AC

## 2020-12-31 NOTE — ED Triage Notes (Signed)
Pt states he was bitten by a tick on his back on Monday and had it removed at [facility]. Pt states he is now having hives, itching on his arms, face. And is worried he is having a reaction

## 2020-12-31 NOTE — ED Provider Notes (Signed)
[facility] EMERGENCY DEPARTMENT Provider Note   CSN: [id number] Arrival date & time: 12/31/20  1152     History Chief Complaint  Patient presents with  . Insect Bite    James Beard. is a 35 y.o. male.  Tick Bite and Rash 4/26, patient had to [facility] He reports that over the last 2days he has had a worsening rash on his bilateral forearms neck and He states that he has a history of asthma, but states that this is the worst flare he has never had Denies any fevers Does state that he has been feeling somewhat achy over the last 2 days, no specific location Otherwise feeling his normal  Has not noticed any other ticks on his body Does not have any lesions on his palms or soles   Has been outside a lot, prior homelessness Prior history of IV drug use, now on methadone, otherwise uses THC only         Past Medical History:  Diagnosis Date  . Bipolar 1 disorder (HCC)   . Eczema   . Hepatitis B antibody positive   . Hepatitis C   . Homelessness   . IV drug abuse (HCC)   . MRSA (methicillin resistant staph aureus) culture positive     Patient Active Problem List   Diagnosis Date Noted  . Septic arthritis (HCC) 02/11/2019  . MSSA bacteremia 01/14/2019  . Tenosynovitis of left ankle 01/08/2019  . Paraspinal abscess (HCC) 01/08/2019  . Arthritis, septic, knee (HCC) 01/07/2019  . Opioid abuse with opioid-induced mood disorder (HCC) 10/02/2018  . Opioid use disorder 09/29/2018  . Substance induced mood disorder (HCC) 09/29/2018  . Major depressive disorder, recurrent severe without psychotic features (HCC) 09/29/2018    Past Surgical History:  Procedure Laterality Date  . I & D EXTREMITY Bilateral 01/08/2019   Procedure: IRRIGATION AND DEBRIDEMENT OF ABSCESS RIGHT KNEE AND LEFT ANKLE;  Surgeon: Terance Hart, MD;  Location: [facility] OR;  Service: Orthopedics;  Laterality: Bilateral;  RIGHT KNEE AND LEFT ANKLE  . NO PAST SURGERIES     . spinal abcess         Family History  Problem Relation Age of Onset  . Diabetes Mother   . Hypertension Mother   . Depression Mother   . Diabetes Father   . Hypertension Father   . Depression Father     Social History   Tobacco Use  . Smoking status: Current Every Day Smoker    Packs/day: 1.00    Types: Cigarettes  . Smokeless tobacco: Never Used  Vaping Use  . Vaping Use: Never used  Substance Use Topics  . Alcohol use: Not Currently  . Drug use: Yes    Types: Cocaine, Marijuana, Methamphetamines, IV    Home Medications Prior to Admission medications   Medication Sig Start Date End Date Taking? Authorizing Provider  doxycycline (VIBRA-TABS) 100 MG tablet Take 1 tablet (100 mg total) by mouth 2 (two) times daily for 10 days. 12/31/20 01/10/21 Yes Morley Gaumer, Solmon Ice, DO  predniSONE (DELTASONE) 10 MG tablet Take 1 tablet (10 mg total) by mouth daily. Take 4 tablets daily for 1 week, then 3 tablets daily for two days, two tablets daily for 2 days, then 1 tablet daily for 2 days. 12/31/20  Yes Viva Gallaher, Solmon Ice, DO  OLANZapine (ZYPREXA) 2.5 MG tablet Take 1 tablet (2.5 mg total) by mouth at bedtime. Patient not taking: Reported on 02/19/2019 02/15/19   Jodelle Red  K, MD  sertraline (ZOLOFT) 100 MG tablet Take 1 tablet (100 mg total) by mouth daily. Patient not taking: Reported on 02/19/2019 02/16/19   Yvette Rack, MD    Allergies    Patient has no known allergies.  Review of Systems   Review of Systems  Constitutional: Negative for activity change, appetite change, chills and fever.  HENT: Negative for congestion.   Eyes: Negative for visual disturbance.  Respiratory: Negative for shortness of breath.   Cardiovascular: Negative for chest pain.  Gastrointestinal: Negative for abdominal pain, diarrhea, nausea and vomiting.  Genitourinary: Negative for difficulty urinating and dysuria.  Musculoskeletal:       Generalized achiness  Skin: Positive for rash.   Neurological: Negative for headaches.    Physical Exam Updated Vital Signs BP 119/70   Pulse (!) 50   Temp 99.3 F (37.4 C) (Oral)   Resp 16   SpO2 98%   Physical Exam Constitutional:      General: He is not in acute distress.    Appearance: Normal appearance. He is not ill-appearing.  HENT:     Head: Normocephalic and atraumatic.     Nose: Nose normal.     Mouth/Throat:     Mouth: Mucous membranes are moist.     Pharynx: No posterior oropharyngeal erythema.     Comments: No lesions Eyes:     Conjunctiva/sclera: Conjunctivae normal.  Cardiovascular:     Rate and Rhythm: Normal rate and regular rhythm.     Heart sounds: No murmur heard. No friction rub. No gallop.   Pulmonary:     Effort: Pulmonary effort is normal.     Breath sounds: Normal breath sounds. No wheezing, rhonchi or rales.  Musculoskeletal:     Right lower leg: No edema.     Left lower leg: No edema.  Skin:    Comments: Erythematous maculopapular rash on bilateral volar wrists extending to forearms, multiple excoriations noted, mildly erythema of skin on back with few small papules and excoriation at base of neck, no lesions on legs, see images below  Neurological:     General: No focal deficit present.     Mental Status: He is alert and oriented to person, place, and time.  Psychiatric:        Mood and Affect: Mood normal.        Behavior: Behavior normal.           ED Results / Procedures / Treatments   Labs (all labs ordered are listed, but only abnormal results are displayed) Labs Reviewed - No data to display  EKG None  Radiology No results found.  Procedures Procedures   Medications Ordered in ED Medications - No data to display  ED Course  I have reviewed the triage vital signs and the nursing notes.  Pertinent labs & imaging results that were available during my care of the patient were reviewed by me and considered in my medical decision making (see chart for  details).    MDM Rules/Calculators/A&P                          Patient is a 35 year old male with past medical history significant for prior IV drug use, now on methadone, prior homelessness, bipolar disorder, eczema, who presents with concern for tick bite that occurred about 2 days ago, and worsening of rash.  His rash appears to be more consistent with irritant contact dermatitis such as poison oak or  poison sumac.  Patient does state that he previously has used gloves when cleaning out brush 3 weeks ago, and recently put the gloves back on.  He does not have any lesions on his palms or soles.  Mucous membranes not involved.  Given this, we will give him prednisone to improve rash and itching.  Given patient's prior history of homelessness, although this most recent tick bite would likely not cause symptoms of achiness, will go ahead and treat for tickborne illness with doxycycline 100 mg twice daily for 10 days.  Patient advised to follow-up if no improvement in symptoms, worsening of symptoms, develops fever, rash that happens on his palms or soles, arthralgias.  He voiced understanding.  Patient was discharged home in stable condition.   Final Clinical Impression(s) / ED Diagnoses Final diagnoses:  Tick bite, unspecified site, subsequent encounter  Rash    Rx / DC Orders ED Discharge Orders         Ordered    doxycycline (VIBRA-TABS) 100 MG tablet  2 times daily        12/31/20 1622    predniSONE (DELTASONE) 10 MG tablet  Daily        12/31/20 1622           Unknown Jim, DO 12/31/20 1726    Gwyneth Sprout, MD 01/04/21 [phone]

## 2020-12-31 NOTE — Discharge Instructions (Addendum)
As we discussed, it does not seem like your rash was caused by a tick, but we will go ahead and treat you for a tick bone illness because of your achiness.  Doxycycline can increase your sensitivity to sun, so avoid direct sunlight and use sunscreen.  We will also give you steroids to treat for possible poison ivy.  This should help your eczema as well.  Change out your gloves.  Come back if you have worsening and no improvement, if you develop fevers, joint pain, rash on your palms or soles.

## 2021-01-01 ENCOUNTER — Encounter: Payer: Self-pay | Admitting: Emergency Medicine

## 2021-01-01 ENCOUNTER — Other Ambulatory Visit: Payer: Self-pay

## 2021-01-01 DIAGNOSIS — R21 Rash and other nonspecific skin eruption: Secondary | ICD-10-CM | POA: Insufficient documentation

## 2021-01-01 DIAGNOSIS — Z5321 Procedure and treatment not carried out due to patient leaving prior to being seen by health care provider: Secondary | ICD-10-CM | POA: Insufficient documentation

## 2021-01-01 NOTE — ED Triage Notes (Signed)
Pt reports has poison ivy on his left arm and needs a steroid

## 2021-01-02 ENCOUNTER — Emergency Department
Admission: EM | Admit: 2021-01-02 | Discharge: 2021-01-02 | Disposition: A | Discharge status: Home / Self Care | Payer: Self-pay | Attending: Physician Assistant | Admitting: Physician Assistant

## 2021-01-02 ENCOUNTER — Other Ambulatory Visit: Payer: Self-pay

## 2021-01-02 ENCOUNTER — Emergency Department
Admission: EM | Admit: 2021-01-02 | Discharge: 2021-01-02 | Disposition: A | Discharge status: Home / Self Care | Payer: Self-pay | Attending: Emergency Medicine | Admitting: Emergency Medicine

## 2021-01-02 DIAGNOSIS — L237 Allergic contact dermatitis due to plants, except food: Secondary | ICD-10-CM | POA: Insufficient documentation

## 2021-01-02 DIAGNOSIS — F17210 Nicotine dependence, cigarettes, uncomplicated: Secondary | ICD-10-CM | POA: Insufficient documentation

## 2021-01-02 MED ORDER — METHYLPREDNISOLONE SODIUM SUCC 125 MG IJ SOLR
125.0000 mg | Freq: Once | INTRAMUSCULAR | Status: AC
  Administered 2021-01-02: 125 mg via INTRAMUSCULAR
  Filled 2021-01-02: qty 2

## 2021-01-02 MED ORDER — HYDROXYZINE HCL 50 MG PO TABS
50.0000 mg | ORAL_TABLET | Freq: Three times a day (TID) | ORAL | 0 refills | Status: DC | PRN
Start: 2021-01-02 — End: 2021-01-21

## 2021-01-02 MED ORDER — METHYLPREDNISOLONE 4 MG PO TBPK
ORAL_TABLET | ORAL | 0 refills | Status: DC
Start: 2021-01-02 — End: 2021-01-21

## 2021-01-02 NOTE — Discharge Instructions (Signed)
Continue previous medications and start prednisone and Atarax as directed.

## 2021-01-02 NOTE — ED Triage Notes (Signed)
Pt to ED for rash to bilateral arms and face. Recently seen at [facility] and [facility] last night.  Pt provided prescriptions at [facility] but would like prednisone.  Was told dx poison ivy and think detergent at work irritating.  No swelling noted to face.

## 2021-01-02 NOTE — ED Notes (Signed)
Called pt x1 without answer.

## 2021-01-02 NOTE — ED Notes (Signed)
Called pt on phone. Pt answered then immediately hung up on RN.

## 2021-01-02 NOTE — ED Provider Notes (Signed)
[facility] Emergency Department Provider Note   ____________________________________________   Event Date/Time   First MD Initiated Contact with Patient 01/02/21 1025     (approximate)  I have reviewed the triage vital signs and the nursing notes.   HISTORY  Chief Complaint Rash    HPI James Beard. is a 35 y.o. male patient presents for rash to bilateral arms and face.  Patient stable event, contact with poison oak/poison ivy.  Patient stated intense itching.  Patient is currently taking doxycycline for multiple tick bites.  States discomfort is rated a 6/10 secondary to intense itching.         Past Medical History:  Diagnosis Date  . Bipolar 1 disorder (HCC)   . Eczema   . Hepatitis B antibody positive   . Hepatitis C   . Homelessness   . IV drug abuse (HCC)   . MRSA (methicillin resistant staph aureus) culture positive     Patient Active Problem List   Diagnosis Date Noted  . Septic arthritis (HCC) 02/11/2019  . MSSA bacteremia 01/14/2019  . Tenosynovitis of left ankle 01/08/2019  . Paraspinal abscess (HCC) 01/08/2019  . Arthritis, septic, knee (HCC) 01/07/2019  . Opioid abuse with opioid-induced mood disorder (HCC) 10/02/2018  . Opioid use disorder 09/29/2018  . Substance induced mood disorder (HCC) 09/29/2018  . Major depressive disorder, recurrent severe without psychotic features (HCC) 09/29/2018    Past Surgical History:  Procedure Laterality Date  . I & D EXTREMITY Bilateral 01/08/2019   Procedure: IRRIGATION AND DEBRIDEMENT OF ABSCESS RIGHT KNEE AND LEFT ANKLE;  Surgeon: Terance Hart, MD;  Location: [facility] OR;  Service: Orthopedics;  Laterality: Bilateral;  RIGHT KNEE AND LEFT ANKLE  . NO PAST SURGERIES    . spinal abcess      Prior to Admission medications   Medication Sig Start Date End Date Taking? Authorizing Provider  hydrOXYzine (ATARAX/VISTARIL) 50 MG tablet Take 1 tablet (50 mg total) by mouth 3  (three) times daily as needed. 01/02/21  Yes Joni Reining, PA-C  methylPREDNISolone (MEDROL DOSEPAK) 4 MG TBPK tablet Take Tapered dose as directed 01/02/21  Yes Joni Reining, PA-C  doxycycline (VIBRA-TABS) 100 MG tablet Take 1 tablet (100 mg total) by mouth 2 (two) times daily for 10 days. 12/31/20 01/10/21  Meccariello, Solmon Ice, DO  OLANZapine (ZYPREXA) 2.5 MG tablet Take 1 tablet (2.5 mg total) by mouth at bedtime. Patient not taking: Reported on 02/19/2019 02/15/19   Yvette Rack, MD  predniSONE (DELTASONE) 10 MG tablet Take 1 tablet (10 mg total) by mouth daily. Take 4 tablets daily for 1 week, then 3 tablets daily for two days, two tablets daily for 2 days, then 1 tablet daily for 2 days. 12/31/20   Meccariello, Solmon Ice, DO  sertraline (ZOLOFT) 100 MG tablet Take 1 tablet (100 mg total) by mouth daily. Patient not taking: Reported on 02/19/2019 02/16/19   Yvette Rack, MD    Allergies Patient has no known allergies.  Family History  Problem Relation Age of Onset  . Diabetes Mother   . Hypertension Mother   . Depression Mother   . Diabetes Father   . Hypertension Father   . Depression Father     Social History Social History   Tobacco Use  . Smoking status: Current Every Day Smoker    Packs/day: 1.00    Types: Cigarettes  . Smokeless tobacco: Never Used  Vaping Use  . Vaping Use:  Never used  Substance Use Topics  . Alcohol use: Not Currently  . Drug use: Yes    Types: Cocaine, Marijuana, Methamphetamines, IV    Review of Systems Constitutional: No fever/chills Eyes: No visual changes. ENT: No sore throat. Cardiovascular: Denies chest pain. Respiratory: Denies shortness of breath. Gastrointestinal: No abdominal pain.  No nausea, no vomiting.  No diarrhea.  No constipation. Genitourinary: Negative for dysuria. Musculoskeletal: Negative for back pain. Skin: Positive for rash. Neurological: Negative for headaches, focal weakness or  numbness.   ____________________________________________   PHYSICAL EXAM:  VITAL SIGNS: ED Triage Vitals  Enc Vitals Group     BP 01/02/21 1020 133/72     Pulse Rate 01/02/21 1020 62     Resp 01/02/21 1020 18     Temp 01/02/21 1018 99.1 F (37.3 C)     Temp Source 01/02/21 1018 Oral     SpO2 01/02/21 1020 97 %     Weight 01/02/21 1018 213 lb 13.5 oz (97 kg)     Height 01/02/21 1018 5\' 9"  (1.753 m)     Head Circumference --      Peak Flow --      Pain Score 01/02/21 1018 6     Pain Loc --      Pain Edu? --      Excl. in GC? --    Constitutional: Alert and oriented. Well appearing and in no acute distress. Eyes: Conjunctivae are normal. PERRL. EOMI. Head: Atraumatic. Nose: No congestion/rhinnorhea. Mouth/Throat: Mucous membranes are moist.  Oropharynx non-erythematous. Cardiovascular: Normal rate, regular rhythm. Grossly normal heart sounds.  Good peripheral circulation. Respiratory: Normal respiratory effort.  No retractions. Lungs CTAB. Musculoskeletal: No lower extremity tenderness nor edema.  No joint effusions. Neurologic:  Normal speech and language. No gross focal neurologic deficits are appreciated. No gait instability. Skin:  Skin is warm, dry and intact.  Multiple vesicle lesion on upper extremities neck and facial area.  Eyes are spared.   Psychiatric: Mood and affect are normal. Speech and behavior are normal.  ____________________________________________   LABS (all labs ordered are listed, but only abnormal results are displayed)  Labs Reviewed - No data to display ____________________________________________  EKG   ____________________________________________  RADIOLOGY I, 01/04/21, personally viewed and evaluated these images (plain radiographs) as part of my medical decision making, as well as reviewing the written report by the radiologist.  ED MD interpretation:    Official radiology report(s): No results  found.  ____________________________________________   PROCEDURES  Procedure(s) performed (including Critical Care):  Procedures   ____________________________________________   INITIAL IMPRESSION / ASSESSMENT AND PLAN / ED COURSE  As part of my medical decision making, I reviewed the following data within the electronic MEDICAL RECORD NUMBER         Patient presents for rash secondary to suspected poison oak/poison ivy.  Patient given Solu-Medrol IM which should be follow-up Medrol Dosepak and a prescription for Atarax.  Patient advised to continue previous medications and follow-up with PCP.  Return to ED if condition worsens.      ____________________________________________   FINAL CLINICAL IMPRESSION(S) / ED DIAGNOSES  Final diagnoses:  Allergic contact dermatitis due to plants, except food     ED Discharge Orders         Ordered    methylPREDNISolone (MEDROL DOSEPAK) 4 MG TBPK tablet        01/02/21 1035    hydrOXYzine (ATARAX/VISTARIL) 50 MG tablet  3 times daily PRN  01/02/21 1035          *Please note:  James Beard. was evaluated in Emergency Department on 01/02/2021 for the symptoms described in the history of present illness. He was evaluated in the context of the global COVID-19 pandemic, which necessitated consideration that the patient might be at risk for infection with the SARS-CoV-2 virus that causes COVID-19. Institutional protocols and algorithms that pertain to the evaluation of patients at risk for COVID-19 are in a state of rapid change based on information released by regulatory bodies including the CDC and federal and state organizations. These policies and algorithms were followed during the patient's care in the ED.  Some ED evaluations and interventions may be delayed as a result of limited staffing during and the pandemic.*   Note:  This document was prepared using Dragon voice recognition software and may include unintentional  dictation errors.    Joni Reining, PA-C 01/02/21 1042    Gilles Chiquito, MD 01/02/21 1146

## 2021-01-02 NOTE — ED Notes (Signed)
No answer when called x3, rounder in lobby states pt has not been able to be located.

## 2021-01-21 ENCOUNTER — Encounter: Payer: Self-pay | Admitting: Emergency Medicine

## 2021-01-21 ENCOUNTER — Emergency Department
Admission: EM | Admit: 2021-01-21 | Discharge: 2021-01-21 | Disposition: A | Discharge status: Home / Self Care | Payer: Self-pay | Attending: Emergency Medicine | Admitting: Emergency Medicine

## 2021-01-21 ENCOUNTER — Other Ambulatory Visit: Payer: Self-pay

## 2021-01-21 DIAGNOSIS — B9562 Methicillin resistant Staphylococcus aureus infection as the cause of diseases classified elsewhere: Secondary | ICD-10-CM | POA: Insufficient documentation

## 2021-01-21 DIAGNOSIS — L03119 Cellulitis of unspecified part of limb: Secondary | ICD-10-CM | POA: Insufficient documentation

## 2021-01-21 DIAGNOSIS — F17210 Nicotine dependence, cigarettes, uncomplicated: Secondary | ICD-10-CM | POA: Insufficient documentation

## 2021-01-21 MED ORDER — SULFAMETHOXAZOLE-TRIMETHOPRIM 800-160 MG PO TABS
1.0000 | ORAL_TABLET | Freq: Once | ORAL | Status: AC
  Administered 2021-01-21: 1 via ORAL
  Filled 2021-01-21: qty 1

## 2021-01-21 MED ORDER — SULFAMETHOXAZOLE-TRIMETHOPRIM 800-160 MG PO TABS
1.0000 | ORAL_TABLET | Freq: Two times a day (BID) | ORAL | 0 refills | Status: DC
Start: 2021-01-21 — End: 2021-08-08

## 2021-01-21 NOTE — ED Provider Notes (Signed)
[facility] Emergency Department Provider Note  ____________________________________________  Time seen: Approximately 7:22 PM  I have reviewed the triage vital signs and the nursing notes.   HISTORY  Chief Complaint Rash    HPI James Marrs. is a 35 y.o. male who presents the emergency department complaining of sores to the bilateral arms.  Patient states that he has developed these open sores with what he describes as a honey colored crust over top.  Patient states that they are sore, and they ooze.  There is no purulent drainage but has had some serosanguineous type drainage from these wounds.  No fevers or chills, no systemic complaints.  Patient does have a history of bacteremia from MRSA.  He states that he was admitted to the hospital for almost 2 months after he developed a spinal abscess, as well as to septic joints from MRSA.  Patient is concerned that he has another infection given these spreading wounds to the bilateral forearms.  He has a history of hepatitis, MRSA.         Past Medical History:  Diagnosis Date  . Bipolar 1 disorder (HCC)   . Eczema   . Hepatitis B antibody positive   . Hepatitis C   . Homelessness   . IV drug abuse (HCC)   . MRSA (methicillin resistant staph aureus) culture positive     Patient Active Problem List   Diagnosis Date Noted  . Septic arthritis (HCC) 02/11/2019  . MSSA bacteremia 01/14/2019  . Tenosynovitis of left ankle 01/08/2019  . Paraspinal abscess (HCC) 01/08/2019  . Arthritis, septic, knee (HCC) 01/07/2019  . Opioid abuse with opioid-induced mood disorder (HCC) 10/02/2018  . Opioid use disorder 09/29/2018  . Substance induced mood disorder (HCC) 09/29/2018  . Major depressive disorder, recurrent severe without psychotic features (HCC) 09/29/2018    Past Surgical History:  Procedure Laterality Date  . I & D EXTREMITY Bilateral 01/08/2019   Procedure: IRRIGATION AND DEBRIDEMENT OF ABSCESS  RIGHT KNEE AND LEFT ANKLE;  Surgeon: Terance Hart, MD;  Location: [facility] OR;  Service: Orthopedics;  Laterality: Bilateral;  RIGHT KNEE AND LEFT ANKLE  . NO PAST SURGERIES    . spinal abcess      Prior to Admission medications   Medication Sig Start Date End Date Taking? Authorizing Provider  sulfamethoxazole-trimethoprim (BACTRIM DS) 800-160 MG tablet Take 1 tablet by mouth 2 (two) times daily. 01/21/21  Yes Emmilee Reamer, Delorise Royals, PA-C  OLANZapine (ZYPREXA) 2.5 MG tablet Take 1 tablet (2.5 mg total) by mouth at bedtime. Patient not taking: Reported on 02/19/2019 02/15/19   Yvette Rack, MD  sertraline (ZOLOFT) 100 MG tablet Take 1 tablet (100 mg total) by mouth daily. Patient not taking: Reported on 02/19/2019 02/16/19   Yvette Rack, MD    Allergies Patient has no known allergies.  Family History  Problem Relation Age of Onset  . Diabetes Mother   . Hypertension Mother   . Depression Mother   . Diabetes Father   . Hypertension Father   . Depression Father     Social History Social History   Tobacco Use  . Smoking status: Current Every Day Smoker    Packs/day: 1.00    Types: Cigarettes  . Smokeless tobacco: Never Used  Vaping Use  . Vaping Use: Never used  Substance Use Topics  . Alcohol use: Not Currently  . Drug use: Yes    Types: Cocaine, Marijuana, Methamphetamines, IV     Review  of Systems  Constitutional: No fever/chills Eyes: No visual changes. No discharge ENT: No upper respiratory complaints. Cardiovascular: no chest pain. Respiratory: no cough. No SOB. Gastrointestinal: No abdominal pain.  No nausea, no vomiting.  No diarrhea.  No constipation. Musculoskeletal: Negative for musculoskeletal pain. Skin: Multiple sores to the bilateral arms Neurological: Negative for headaches, focal weakness or numbness.  10 System ROS otherwise negative.  ____________________________________________   PHYSICAL EXAM:  VITAL SIGNS: ED Triage Vitals  Enc Vitals  Group     BP 01/21/21 1718 132/69     Pulse Rate 01/21/21 1718 (!) 51     Resp 01/21/21 1718 20     Temp 01/21/21 1718 98.2 F (36.8 C)     Temp Source 01/21/21 1718 Oral     SpO2 01/21/21 1718 97 %     Weight 01/21/21 1717 210 lb (95.3 kg)     Height 01/21/21 1717 5\' 9"  (1.753 m)     Head Circumference --      Peak Flow --      Pain Score 01/21/21 1717 6     Pain Loc --      Pain Edu? --      Excl. in GC? --      Constitutional: Alert and oriented. Well appearing and in no acute distress. Eyes: Conjunctivae are normal. PERRL. EOMI. Head: Atraumatic. ENT:      Ears:       Nose: No congestion/rhinnorhea.      Mouth/Throat: Mucous membranes are moist.  Neck: No stridor.    Cardiovascular: Normal rate, regular rhythm. Normal S1 and S2.  Good peripheral circulation. Respiratory: Normal respiratory effort without tachypnea or retractions. Lungs CTAB. Good air entry to the bases with no decreased or absent breath sounds. Musculoskeletal: Full range of motion to all extremities. No gross deformities appreciated. Neurologic:  Normal speech and language. No gross focal neurologic deficits are appreciated.  Skin:  Skin is warm, dry and intact.  Visualization of bilateral hands and forearms reveals multiple erythematous excoriated lesions.  These do have the appearance of superficial MRSA lesions.  There is no evidence of abscess with fluctuance or induration.  Patient has multiple areas to the hands and forearms.  Currently no drainage. Psychiatric: Mood and affect are normal. Speech and behavior are normal. Patient exhibits appropriate insight and judgement.   ____________________________________________   LABS (all labs ordered are listed, but only abnormal results are displayed)  Labs Reviewed - No data to display ____________________________________________  EKG   ____________________________________________  RADIOLOGY   No results  found.  ____________________________________________    PROCEDURES  Procedure(s) performed:    Procedures    Medications  sulfamethoxazole-trimethoprim (BACTRIM DS) 800-160 MG per tablet 1 tablet (has no administration in time range)     ____________________________________________   INITIAL IMPRESSION / ASSESSMENT AND PLAN / ED COURSE  Pertinent labs & imaging results that were available during my care of the patient were reviewed by me and considered in my medical decision making (see chart for details).  Review of the [place] CSRS was performed in accordance of the [facility] prior to dispensing any controlled drugs.           Patient's diagnosis is consistent with MRSA.  Patient presented to the emergency department with multiple skin lesions.  History of MRSA.  Findings are consistent with MRSA cellulitis.  No evidence of abscess requiring incision and drainage.  Patient has no systemic complaints and no indication for labs or imaging.  Patient  will be placed on a course of Bactrim and referred to dermatology.  I feel that patient is likely colonized and will require longer course of antibiotic but will follow-up with dermatology for further evaluation. Patient is given ED precautions to return to the ED for any worsening or new symptoms.     ____________________________________________  FINAL CLINICAL IMPRESSION(S) / ED DIAGNOSES  Final diagnoses:  MRSA cellulitis      NEW MEDICATIONS STARTED DURING THIS VISIT:  ED Discharge Orders         Ordered    sulfamethoxazole-trimethoprim (BACTRIM DS) 800-160 MG tablet  2 times daily        01/21/21 1939              This chart was dictated using voice recognition software/Dragon. Despite best efforts to proofread, errors can occur which can change the meaning. Any change was purely unintentional.    Racheal Patches, PA-C 01/21/21 1939    Shaune Pollack, MD 01/26/21 [phone]

## 2021-01-21 NOTE — ED Notes (Signed)
See triage note  Presents with open sore areas to both arms  States he developed this about 1 week ago  Describes pain as "burning"  Hx of Hep B and C  Thinks this is coming from his liver

## 2021-01-21 NOTE — ED Triage Notes (Signed)
Pt reports here for a rash on his arms. Pt reports rash showed up about a week or two ago and he has been scratching at them. Pt concerned because he has hepatitis. Pt reports areas itch

## 2021-08-07 ENCOUNTER — Encounter (HOSPITAL_COMMUNITY): Payer: Self-pay | Admitting: Emergency Medicine

## 2021-08-07 ENCOUNTER — Emergency Department (HOSPITAL_COMMUNITY): Payer: Self-pay

## 2021-08-07 ENCOUNTER — Emergency Department (HOSPITAL_COMMUNITY)
Admission: EM | Admit: 2021-08-07 | Discharge: 2021-08-08 | Disposition: A | Discharge status: Home / Self Care | Payer: Self-pay | Attending: Emergency Medicine | Admitting: Emergency Medicine

## 2021-08-07 ENCOUNTER — Other Ambulatory Visit: Payer: Self-pay

## 2021-08-07 DIAGNOSIS — F17210 Nicotine dependence, cigarettes, uncomplicated: Secondary | ICD-10-CM | POA: Insufficient documentation

## 2021-08-07 DIAGNOSIS — L03116 Cellulitis of left lower limb: Secondary | ICD-10-CM | POA: Insufficient documentation

## 2021-08-07 DIAGNOSIS — Z79899 Other long term (current) drug therapy: Secondary | ICD-10-CM | POA: Insufficient documentation

## 2021-08-07 DIAGNOSIS — L03115 Cellulitis of right lower limb: Secondary | ICD-10-CM | POA: Insufficient documentation

## 2021-08-07 LAB — CBC WITH DIFFERENTIAL/PLATELET
Abs Immature Granulocytes: 0.01 10*3/uL (ref 0.00–0.07)
Basophils Absolute: 0 10*3/uL (ref 0.0–0.1)
Basophils Relative: 0 %
Eosinophils Absolute: 0 10*3/uL (ref 0.0–0.5)
Eosinophils Relative: 1 %
HCT: 35.7 % — ABNORMAL LOW (ref 39.0–52.0)
Hemoglobin: 11.7 g/dL — ABNORMAL LOW (ref 13.0–17.0)
Immature Granulocytes: 0 %
Lymphocytes Relative: 19 %
Lymphs Abs: 0.6 10*3/uL — ABNORMAL LOW (ref 0.7–4.0)
MCH: 29.7 pg (ref 26.0–34.0)
MCHC: 32.8 g/dL (ref 30.0–36.0)
MCV: 90.6 fL (ref 80.0–100.0)
Monocytes Absolute: 0.3 10*3/uL (ref 0.1–1.0)
Monocytes Relative: 9 %
Neutro Abs: 2.3 10*3/uL (ref 1.7–7.7)
Neutrophils Relative %: 71 %
Platelets: 143 10*3/uL — ABNORMAL LOW (ref 150–400)
RBC: 3.94 MIL/uL — ABNORMAL LOW (ref 4.22–5.81)
RDW: 13.4 % (ref 11.5–15.5)
WBC: 3.2 10*3/uL — ABNORMAL LOW (ref 4.0–10.5)
nRBC: 0 % (ref 0.0–0.2)

## 2021-08-07 LAB — COMPREHENSIVE METABOLIC PANEL
ALT: 75 U/L — ABNORMAL HIGH (ref 0–44)
AST: 76 U/L — ABNORMAL HIGH (ref 15–41)
Albumin: 3.7 g/dL (ref 3.5–5.0)
Alkaline Phosphatase: 167 U/L — ABNORMAL HIGH (ref 38–126)
Anion gap: 8 (ref 5–15)
BUN: 7 mg/dL (ref 6–20)
CO2: 28 mmol/L (ref 22–32)
Calcium: 8.8 mg/dL — ABNORMAL LOW (ref 8.9–10.3)
Chloride: 98 mmol/L (ref 98–111)
Creatinine, Ser: 0.94 mg/dL (ref 0.61–1.24)
GFR, Estimated: >60 mL/min (ref >60)
Glucose, Bld: 114 mg/dL — ABNORMAL HIGH (ref 70–99)
Potassium: 4.1 mmol/L (ref 3.5–5.1)
Sodium: 134 mmol/L — ABNORMAL LOW (ref 135–145)
Total Bilirubin: 0.7 mg/dL (ref 0.3–1.2)
Total Protein: 6.6 g/dL (ref 6.5–8.1)

## 2021-08-07 LAB — LACTIC ACID, PLASMA: Lactic Acid, Venous: 1.4 mmol/L (ref 0.5–1.9)

## 2021-08-07 NOTE — ED Triage Notes (Signed)
Pt reports skin infection to L lower leg for a few days with fever and chills.  Hx of MRSA cellulitis in May.

## 2021-08-07 NOTE — ED Provider Notes (Signed)
Emergency Medicine Provider Triage Evaluation Note  James Beard. , a 35 y.o. male  was evaluated in triage.  Pt complains of left lower extremity redness.  He states that he started noticing erythema to his left lower leg about 3 days ago.  He says that it is spreading.  He does not remember getting bit by anything but there is an area that looks like a possible bite.  He has had cellulitis in the past multiple times.  He has had subjective fevers at home. Patient also endorses scabs all over his hands and arms.  He thinks this is related to his hepatitis C.  They have been there for about 6 months now and have gotten worse. Patient also states that he has midthoracic back pain that started around the same time his leg started hurting.  He does have a history of IV drug use, but says he has been clean for 2 years now.  Review of Systems  Positive: See above Negative:   Physical Exam  BP (!) 147/84 (BP Location: Left Arm)   Pulse (!) 57   Temp 100.2 F (37.9 C) (Oral)   Resp 18   SpO2 99%  Gen:   Awake, no distress   Resp:  Normal effort  MSK:   Moves extremities without difficulty  Other:  Left lower extremity erythema from mid shin all the way down to ankle.  No obvious swelling noted or drainage. He is has multiple scabs on his right arm.   Medical Decision Making  Medically screening exam initiated at 6:16 PM.  Appropriate orders placed.  Terrin Meddaugh. was informed that the remainder of the evaluation will be completed by another provider, this initial triage assessment does not replace that evaluation, and the importance of remaining in the ED until their evaluation is complete.  History of IV drug use.  Says he is currently clean.  We will work-up for the cellulitis.  Will defer to ED provider following results for possible MRI work-up of back pain.   James Beard 08/07/21 1820    James Munch, MD 08/07/21 [phone]

## 2021-08-08 ENCOUNTER — Encounter (HOSPITAL_COMMUNITY): Payer: Self-pay | Admitting: *Deleted

## 2021-08-08 ENCOUNTER — Other Ambulatory Visit: Payer: Self-pay

## 2021-08-08 ENCOUNTER — Emergency Department (HOSPITAL_COMMUNITY): Payer: Self-pay

## 2021-08-08 ENCOUNTER — Emergency Department (HOSPITAL_COMMUNITY)
Admission: EM | Admit: 2021-08-08 | Discharge: 2021-08-08 | Disposition: A | Discharge status: Home / Self Care | Payer: Self-pay | Attending: Emergency Medicine | Admitting: Emergency Medicine

## 2021-08-08 DIAGNOSIS — F17210 Nicotine dependence, cigarettes, uncomplicated: Secondary | ICD-10-CM | POA: Insufficient documentation

## 2021-08-08 DIAGNOSIS — R6883 Chills (without fever): Secondary | ICD-10-CM | POA: Insufficient documentation

## 2021-08-08 DIAGNOSIS — R42 Dizziness and giddiness: Secondary | ICD-10-CM | POA: Insufficient documentation

## 2021-08-08 DIAGNOSIS — R7309 Other abnormal glucose: Secondary | ICD-10-CM | POA: Insufficient documentation

## 2021-08-08 DIAGNOSIS — R001 Bradycardia, unspecified: Secondary | ICD-10-CM | POA: Insufficient documentation

## 2021-08-08 LAB — CBC WITH DIFFERENTIAL/PLATELET
Abs Immature Granulocytes: 0.01 10*3/uL (ref 0.00–0.07)
Basophils Absolute: 0 10*3/uL (ref 0.0–0.1)
Basophils Relative: 0 %
Eosinophils Absolute: 0 10*3/uL (ref 0.0–0.5)
Eosinophils Relative: 1 %
HCT: 41.3 % (ref 39.0–52.0)
Hemoglobin: 13.7 g/dL (ref 13.0–17.0)
Immature Granulocytes: 0 %
Lymphocytes Relative: 18 %
Lymphs Abs: 0.9 10*3/uL (ref 0.7–4.0)
MCH: 29.9 pg (ref 26.0–34.0)
MCHC: 33.2 g/dL (ref 30.0–36.0)
MCV: 90.2 fL (ref 80.0–100.0)
Monocytes Absolute: 0.5 10*3/uL (ref 0.1–1.0)
Monocytes Relative: 9 %
Neutro Abs: 3.6 10*3/uL (ref 1.7–7.7)
Neutrophils Relative %: 72 %
Platelets: 137 10*3/uL — ABNORMAL LOW (ref 150–400)
RBC: 4.58 MIL/uL (ref 4.22–5.81)
RDW: 13.4 % (ref 11.5–15.5)
WBC: 5 10*3/uL (ref 4.0–10.5)
nRBC: 0 % (ref 0.0–0.2)

## 2021-08-08 LAB — BASIC METABOLIC PANEL
Anion gap: 9 (ref 5–15)
BUN: 9 mg/dL (ref 6–20)
CO2: 28 mmol/L (ref 22–32)
Calcium: 9.5 mg/dL (ref 8.9–10.3)
Chloride: 99 mmol/L (ref 98–111)
Creatinine, Ser: 0.83 mg/dL (ref 0.61–1.24)
GFR, Estimated: >60 mL/min (ref >60)
Glucose, Bld: 124 mg/dL — ABNORMAL HIGH (ref 70–99)
Potassium: 3.7 mmol/L (ref 3.5–5.1)
Sodium: 136 mmol/L (ref 135–145)

## 2021-08-08 LAB — TROPONIN I (HIGH SENSITIVITY): Troponin I (High Sensitivity): 5 ng/L (ref <18)

## 2021-08-08 LAB — CBG MONITORING, ED: Glucose-Capillary: 132 mg/dL — ABNORMAL HIGH (ref 70–99)

## 2021-08-08 MED ORDER — DOXYCYCLINE HYCLATE 100 MG PO CAPS
100.0000 mg | ORAL_CAPSULE | Freq: Two times a day (BID) | ORAL | 0 refills | Status: DC
Start: 2021-08-08 — End: 2021-12-04

## 2021-08-08 MED ORDER — DOXYCYCLINE HYCLATE 100 MG PO TABS
100.0000 mg | ORAL_TABLET | Freq: Once | ORAL | Status: AC
  Administered 2021-08-08: 04:00:00 100 mg via ORAL
  Filled 2021-08-08: qty 1

## 2021-08-08 MED ORDER — KETOROLAC TROMETHAMINE 60 MG/2ML IM SOLN
30.0000 mg | Freq: Once | INTRAMUSCULAR | Status: AC
  Administered 2021-08-08: 04:00:00 30 mg via INTRAMUSCULAR
  Filled 2021-08-08: qty 2

## 2021-08-08 NOTE — ED Provider Notes (Signed)
[facility] EMERGENCY DEPARTMENT Provider Note   CSN: [id number] Arrival date & time: 08/08/21  0500     History Chief Complaint  Patient presents with   Dizziness    James Beard. is a 35 y.o. male.  HPI 35 year old male history of MRSA, IV drug use, hep C, bipolar disorder, presents today complaining of lightheadedness.  He was seen in the ED for possible cellulitis.  He states he was discharged with prescription.  This was in the early morning hours around 4:56 AM.  He states he had to wait outside for his father to come pick him up.  His father had not gotten out of bed yet.  He became lightheaded and sweaty.  He denies any pain associated with this.  He states he has not eaten since yesterday.  He denies any previous similar symptoms.  He states he has had some chills but has not had fever.  He denies headache, head injury, chest pain, cough, dyspnea, abdominal pain, nausea, vomiting, diarrhea.  He states that he took his a.m. dose of methadone's morning.  He does not think he is having any withdrawal symptoms.  He states he has not used drugs for 3 years and has not shot up for 3 years.    Past Medical History:  Diagnosis Date   Bipolar 1 disorder ([place])    Eczema    Hepatitis B antibody positive    Hepatitis C    Homelessness    IV drug abuse ([place])    MRSA (methicillin resistant staph aureus) culture positive     Patient Active Problem List   Diagnosis Date Noted   Septic arthritis ([place]) 02/11/2019   MSSA bacteremia 01/14/2019   Tenosynovitis of left ankle 01/08/2019   Paraspinal abscess ([place]) 01/08/2019   Arthritis, septic, knee ([place]) 01/07/2019   Opioid abuse with opioid-induced mood disorder ([place]) 10/02/2018   Opioid use disorder 09/29/2018   Substance induced mood disorder ([place]) 09/29/2018   Major depressive disorder, recurrent severe without psychotic features ([place]) 09/29/2018    Past Surgical History:  Procedure Laterality Date   I  & D EXTREMITY Bilateral 01/08/2019   Procedure: IRRIGATION AND DEBRIDEMENT OF ABSCESS RIGHT KNEE AND LEFT ANKLE;  Surgeon: Erle Crocker, MD;  Location: [place];  Service: Orthopedics;  Laterality: Bilateral;  RIGHT KNEE AND LEFT ANKLE   NO PAST SURGERIES     spinal abcess         Family History  Problem Relation Age of Onset   Diabetes Mother    Hypertension Mother    Depression Mother    Diabetes Father    Hypertension Father    Depression Father     Social History   Tobacco Use   Smoking status: Every Day    Packs/day: 1.00    Types: Cigarettes   Smokeless tobacco: Never  Vaping Use   Vaping Use: Never used  Substance Use Topics   Alcohol use: Not Currently   Drug use: Yes    Types: Cocaine, Marijuana, Methamphetamines, IV    Home Medications Prior to Admission medications   Medication Sig Start Date End Date Taking? Authorizing Provider  doxycycline (VIBRAMYCIN) 100 MG capsule Take 1 capsule (100 mg total) by mouth 2 (two) times daily. 08/08/21   Fatima Blank, MD  OLANZapine (ZYPREXA) 2.5 MG tablet Take 1 tablet (2.5 mg total) by mouth at bedtime. Patient not taking: Reported on 02/19/2019 02/15/19   Jean Rosenthal, MD  sertraline (  ZOLOFT) 100 MG tablet Take 1 tablet (100 mg total) by mouth daily. Patient not taking: Reported on 02/19/2019 02/16/19   Jean Rosenthal, MD    Allergies    Patient has no known allergies.  Review of Systems   Review of Systems  All other systems reviewed and are negative.  Physical Exam Updated Vital Signs BP 139/88   Pulse (!) 48   Temp (!) 97.5 F (36.4 C) (Oral)   Resp 12   SpO2 99%   Physical Exam Vitals and nursing note reviewed.  Constitutional:      Appearance: Normal appearance.  HENT:     Head: Normocephalic.     Right Ear: External ear normal.     Left Ear: External ear normal.     Nose: Nose normal.     Mouth/Throat:     Mouth: Mucous membranes are moist.     Pharynx: Oropharynx is clear.  Eyes:      Pupils: Pupils are equal, round, and reactive to light.  Cardiovascular:     Rate and Rhythm: Normal rate and regular rhythm.  Pulmonary:     Effort: Pulmonary effort is normal.  Abdominal:     General: Abdomen is flat.     Palpations: Abdomen is soft.  Musculoskeletal:        General: Normal range of motion.     Cervical back: Normal range of motion.     Comments: Some chronic erythematous areas on bilateral upper extremities Bilateral lower extremities with chronic induration of bilateral lower extremities with trace edema.  Skin:    General: Skin is warm and dry.     Capillary Refill: Capillary refill takes less than 2 seconds.  Neurological:     General: No focal deficit present.     Mental Status: He is alert.  Psychiatric:        Mood and Affect: Mood normal.    ED Results / Procedures / Treatments   Labs (all labs ordered are listed, but only abnormal results are displayed) Labs Reviewed  CBC WITH DIFFERENTIAL/PLATELET - Abnormal; Notable for the following components:      Result Value   Platelets 137 (*)    All other components within normal limits  BASIC METABOLIC PANEL - Abnormal; Notable for the following components:   Glucose, Bld 124 (*)    All other components within normal limits  CBG MONITORING, ED - Abnormal; Notable for the following components:   Glucose-Capillary 132 (*)    All other components within normal limits  TROPONIN I (HIGH SENSITIVITY)    EKG EKG Interpretation  Date/Time:  Sunday August 08 2021 09:09:45 EST Ventricular Rate:  44 PR Interval:  177 QRS Duration: 102 QT Interval:  498 QTC Calculation: 426 R Axis:   79 Text Interpretation: Sinus bradycardia Confirmed by Pattricia Boss [phone]) on 08/08/2021 9:25:11 AM  Radiology DG Chest 2 View  Result Date: 08/08/2021 CLINICAL DATA:  Dizziness. EXAM: CHEST - 2 VIEW COMPARISON:  09/27/2018 FINDINGS: The lungs are clear without focal pneumonia, edema, pneumothorax or pleural effusion.  The cardiopericardial silhouette is within normal limits for size. The visualized bony structures of the thorax show no acute abnormality. IMPRESSION: No active cardiopulmonary disease. Electronically Signed   By: Misty Stanley M.D.   On: 08/08/2021 06:28   DG Tibia/Fibula Left  Result Date: 08/07/2021 CLINICAL DATA:  Mid left foreleg cellulitis, pain EXAM: LEFT TIBIA AND FIBULA - 2 VIEW COMPARISON:  None. FINDINGS: Normal alignment. No acute fracture or  dislocation. No erosions or abnormal periosteal reaction. Soft tissues are unremarkable. Incidentally noted is a expansile excrescence of a bone arising anteromedially from the distal diaphysis of the left femur demonstrating a continuous trabecular pattern within the medullary space of the distal diaphysis as is most in keeping with an osteochondroma measuring at least 4.2 x 9.0 cm. IMPRESSION: No acute abnormality involving the left foreleg. Incompletely evaluated ossific mass arising from the distal left femoral diaphysis most in keeping with an osteochondroma. Given its size, this would be better assessed with MRI examination if prior examinations are unavailable to document stability. Electronically Signed   By: Fidela Salisbury M.D.   On: 08/07/2021 19:00    Procedures Procedures   Medications Ordered in ED Medications - No data to display  ED Course  I have reviewed the triage vital signs and the nursing notes.  Pertinent labs & imaging results that were available during my care of the patient were reviewed by me and considered in my medical decision making (see chart for details).  Clinical Course as of 08/08/21 0959  Sun Aug 08, 2021  2641 EKG reviewed and is bradycardic at 44.  However reviewed prior EKG and heart rate also previously in the 77s [DR]  5771 35 year old male who was seen last night and discharged home.  While he was waiting for his ride he felt somewhat lightheaded.  Here in the ED he was noted to have a heart rate of 44  his EKG.  However, this is unchanged from prior.  His orthostatics are stable.  His CBC, troponin, and be met are essentially normal.  He appears stable for discharge [DR]    Clinical Course User Index [DR] Pattricia Boss, MD   MDM Rules/Calculators/A&P                         {Final Clinical Impression(s) / ED Diagnoses Final diagnoses:  Lightheadedness  Bradycardia    Rx / DC Orders ED Discharge Orders     None        Pattricia Boss, MD 08/08/21 [phone]

## 2021-08-08 NOTE — ED Notes (Signed)
Pt reports dizziness x2 episodes.  Reports feeling better after taking a nap in the lobby.

## 2021-08-08 NOTE — ED Triage Notes (Signed)
Pt just discharged, reports sitting at the bus stop and became dizzy and diaphoretic. Pt ambulatory with steady gait around room, paper scrub top given

## 2021-08-08 NOTE — ED Provider Notes (Signed)
Emergency Medicine Provider Triage Evaluation Note  James Beard. , a 35 y.o. male  was evaluated in triage.  Pt complains of dizziness.  States he was just discharged from ED after being seen for infection of the leg.  States he was waiting outside and began to get dizzy, sweaty, and felt like he was going to pass out.  Denies chest pain or SOB.  Review of Systems  Positive: dizziness Negative: Chest pain, SOB  Physical Exam  BP (!) 142/86 (BP Location: Left Arm)   Pulse (!) 57   Temp (!) 97.5 F (36.4 C) (Oral)   Resp 20   SpO2 99%   Gen:   Awake, no distress   Resp:  Normal effort  MSK:   Moves extremities without difficulty  Other:  Shirt is soaked  Medical Decision Making  Medically screening exam initiated at 5:53 AM.  Appropriate orders placed.  James Beard. was informed that the remainder of the evaluation will be completed by another provider, this initial triage assessment does not replace that evaluation, and the importance of remaining in the ED until their evaluation is complete.  Dizziness.  No focal deficits.  EKG, labs, CXR.   James Hatchet, James Beard 08/08/21 0556    Nira Conn, MD 08/08/21 [phone]

## 2021-08-08 NOTE — ED Provider Notes (Signed)
[facility] EMERGENCY DEPARTMENT Provider Note  CSN: [id number] Arrival date & time: 08/07/21 1631  Chief Complaint(s) skin infection  HPI James Beard. is a 35 y.o. male with a past medical history listed below including prior IV drug use currently on methadone, history of MRSA who presents to the emergency department left lower extremity pain and redness noted 3 days ago.  Reports that he was bit by something or headache.  He is unsure of which.  Noted redness spreading from the wound that has progressively worsened.  Pain is an aching and throbbing pain, moderate to severe.  Worse with palpation.  Associated swelling.  Patient also has wound on the right lower extremity also now developing redness.  This is also now tender to palpation.  Denies any fevers or chills.  No recent drug use.  No other physical complaints.  The history is provided by the patient.   Past Medical History Past Medical History:  Diagnosis Date   Bipolar 1 disorder ([facility])    Eczema    Hepatitis B antibody positive    Hepatitis C    Homelessness    IV drug abuse ([facility])    MRSA (methicillin resistant staph aureus) culture positive    Patient Active Problem List   Diagnosis Date Noted   Septic arthritis ([facility]) 02/11/2019   MSSA bacteremia 01/14/2019   Tenosynovitis of left ankle 01/08/2019   Paraspinal abscess ([facility]) 01/08/2019   Arthritis, septic, knee ([facility]) 01/07/2019   Opioid abuse with opioid-induced mood disorder ([facility]) 10/02/2018   Opioid use disorder 09/29/2018   Substance induced mood disorder ([facility]) 09/29/2018   Major depressive disorder, recurrent severe without psychotic features ([facility]) 09/29/2018   Home Medication(s) Prior to Admission medications   Medication Sig Start Date End Date Taking? Authorizing Provider  doxycycline (VIBRAMYCIN) 100 MG capsule Take 1 capsule (100 mg total) by mouth 2 (two) times daily. 08/08/21  Yes Garen Woolbright, Amadeo Garnet, MD  OLANZapine (ZYPREXA)  2.5 MG tablet Take 1 tablet (2.5 mg total) by mouth at bedtime. Patient not taking: Reported on 02/19/2019 02/15/19   Yvette Rack, MD  sertraline (ZOLOFT) 100 MG tablet Take 1 tablet (100 mg total) by mouth daily. Patient not taking: Reported on 02/19/2019 02/16/19   Yvette Rack, MD                                                                                                                                    Past Surgical History Past Surgical History:  Procedure Laterality Date   I & D EXTREMITY Bilateral 01/08/2019   Procedure: IRRIGATION AND DEBRIDEMENT OF ABSCESS RIGHT KNEE AND LEFT ANKLE;  Surgeon: Terance Hart, MD;  Location: [facility] OR;  Service: Orthopedics;  Laterality: Bilateral;  RIGHT KNEE AND LEFT ANKLE   NO PAST SURGERIES     spinal abcess     Family History Family History  Problem Relation Age  of Onset   Diabetes Mother    Hypertension Mother    Depression Mother    Diabetes Father    Hypertension Father    Depression Father     Social History Social History   Tobacco Use   Smoking status: Every Day    Packs/day: 1.00    Types: Cigarettes   Smokeless tobacco: Never  Vaping Use   Vaping Use: Never used  Substance Use Topics   Alcohol use: Not Currently   Drug use: Yes    Types: Cocaine, Marijuana, Methamphetamines, IV   Allergies Patient has no known allergies.  Review of Systems Review of Systems All other systems are reviewed and are negative for acute change except as noted in the HPI  Physical Exam Vital Signs  I have reviewed the triage vital signs BP 127/75 (BP Location: Left Arm)   Pulse (!) 56   Temp 99.3 F (37.4 C)   Resp 16   Ht  (1.753 m)   Wt 90.7 kg   SpO2 95%   BMI 29.53 kg/m   Physical Exam Vitals reviewed.  Constitutional:      General: He is not in acute distress.    Appearance: He is well-developed. He is not diaphoretic.  HENT:     Head: Normocephalic and atraumatic.     Right Ear: External ear normal.      Left Ear: External ear normal.     Nose: Nose normal.     Mouth/Throat:     Mouth: Mucous membranes are moist.  Eyes:     General: No scleral icterus.    Conjunctiva/sclera: Conjunctivae normal.  Neck:     Trachea: Phonation normal.  Cardiovascular:     Rate and Rhythm: Normal rate and regular rhythm.  Pulmonary:     Effort: Pulmonary effort is normal. No respiratory distress.     Breath sounds: No stridor.  Abdominal:     General: There is no distension.  Musculoskeletal:        General: Normal range of motion.     Cervical back: Normal range of motion.     Right lower leg: Tenderness present.     Left lower leg: Swelling and tenderness present.       Legs:  Skin:    Findings: Erythema (to BLE) and wound (numerous throughout his extremities. Wounds on BLE have surrounding erythema noted in image) present.  Neurological:     Mental Status: He is alert and oriented to person, place, and time.  Psychiatric:        Behavior: Behavior normal.    ED Results and Treatments Labs (all labs ordered are listed, but only abnormal results are displayed) Labs Reviewed  COMPREHENSIVE METABOLIC PANEL - Abnormal; Notable for the following components:      Result Value   Sodium 134 (*)    Glucose, Bld 114 (*)    Calcium 8.8 (*)    AST 76 (*)    ALT 75 (*)    Alkaline Phosphatase 167 (*)    All other components within normal limits  CBC WITH DIFFERENTIAL/PLATELET - Abnormal; Notable for the following components:   WBC 3.2 (*)    RBC 3.94 (*)    Hemoglobin 11.7 (*)    HCT 35.7 (*)    Platelets 143 (*)    Lymphs Abs 0.6 (*)    All other components within normal limits  LACTIC ACID, PLASMA  EKG  EKG Interpretation  Date/Time:    Ventricular Rate:    PR Interval:    QRS Duration:   QT Interval:    QTC Calculation:   R Axis:     Text Interpretation:          Radiology DG Tibia/Fibula Left  Result Date: 08/07/2021 CLINICAL DATA:  Mid left foreleg cellulitis, pain EXAM: LEFT TIBIA AND FIBULA - 2 VIEW COMPARISON:  None. FINDINGS: Normal alignment. No acute fracture or dislocation. No erosions or abnormal periosteal reaction. Soft tissues are unremarkable. Incidentally noted is a expansile excrescence of a bone arising anteromedially from the distal diaphysis of the left femur demonstrating a continuous trabecular pattern within the medullary space of the distal diaphysis as is most in keeping with an osteochondroma measuring at least 4.2 x 9.0 cm. IMPRESSION: No acute abnormality involving the left foreleg. Incompletely evaluated ossific mass arising from the distal left femoral diaphysis most in keeping with an osteochondroma. Given its size, this would be better assessed with MRI examination if prior examinations are unavailable to document stability. Electronically Signed   By: Helyn Numbers M.D.   On: 08/07/2021 19:00    Pertinent labs & imaging results that were available during my care of the patient were reviewed by me and considered in my medical decision making (see MDM for details).  Medications Ordered in ED Medications  ketorolac (TORADOL) injection 30 mg (30 mg Intramuscular Given 08/08/21 0400)  doxycycline (VIBRA-TABS) tablet 100 mg (100 mg Oral Given 08/08/21 0359)                                                                                                                                     Procedures Procedures  (including critical care time)  Medical Decision Making / ED Course I have reviewed the nursing notes for this encounter and the patient's prior records (if available in EHR or on provided paperwork).  Stark Aguinaga. was evaluated in Emergency Department on 08/08/2021 for the symptoms described in the history of present illness. He was evaluated in the context of the global COVID-19 pandemic, which necessitated  consideration that the patient might be at risk for infection with the SARS-CoV-2 virus that causes COVID-19. Institutional protocols and algorithms that pertain to the evaluation of patients at risk for COVID-19 are in a state of rapid change based on information released by regulatory bodies including the CDC and federal and state organizations. These policies and algorithms were followed during the patient's care in the ED.     Presentation is consistent with cellulitis of bilateral lower extremities.  History of MRSA.  We will treat with doxycycline. Plain film obtained in the MSE process without evidence of subcutaneous gas concerning for necrotizing fasciitis.  I have low suspicion for DVT given the bilateral nature of his symptoms related to wounds and his history of recurrent skin infections from MRSA.  Plain film  did reveal a bony mass in the distal femur.  Patient made aware of this and reported that he has had this since he was a child.   Pertinent labs & imaging results that were available during my care of the patient were reviewed by me and considered in my medical decision making:    Final Clinical Impression(s) / ED Diagnoses Final diagnoses:  Bilateral cellulitis of lower leg    The patient appears reasonably screened and/or stabilized for discharge and I doubt any other medical condition or other [facility] requiring further screening, evaluation, or treatment in the ED at this time prior to discharge. Safe for discharge with strict return precautions.  Disposition: Discharge  Condition: Good  I have discussed the results, Dx and Tx plan with the patient/family who expressed understanding and agree(s) with the plan. Discharge instructions discussed at length. The patient/family was given strict return precautions who verbalized understanding of the instructions. No further questions at time of discharge.    ED Discharge Orders          Ordered    doxycycline (VIBRAMYCIN) 100 MG  capsule  2 times daily        08/08/21 0401              Follow Up: Lavinia Sharps, NP [address] [place] [address] [phone]  Call  to schedule an appointment for close follow up   This chart was dictated using voice recognition software.  Despite best efforts to proofread,  errors can occur which can change the documentation meaning.    Nira Conn, MD 08/08/21 [phone]

## 2021-08-08 NOTE — Discharge Instructions (Signed)
Your labs are normal Your heart rate is low on your EKG but has been consistently low when seen over the past several visits.  This may be related to your methadone. Please drink plenty of fluids and return if you are having any worsening symptoms

## 2021-11-30 ENCOUNTER — Other Ambulatory Visit: Payer: Self-pay

## 2021-11-30 ENCOUNTER — Emergency Department
Admission: EM | Admit: 2021-11-30 | Discharge: 2021-11-30 | Disposition: A | Discharge status: Home / Self Care | Payer: 59 | Attending: Emergency Medicine | Admitting: Emergency Medicine

## 2021-11-30 DIAGNOSIS — B1910 Unspecified viral hepatitis B without hepatic coma: Secondary | ICD-10-CM | POA: Diagnosis not present

## 2021-11-30 DIAGNOSIS — R112 Nausea with vomiting, unspecified: Secondary | ICD-10-CM | POA: Diagnosis present

## 2021-11-30 NOTE — ED Notes (Signed)
Pt gives verbal consent for DC and refused DC vitals  ? ?

## 2021-11-30 NOTE — ED Notes (Signed)
Pt has been having nausea and vomiting.  ?

## 2021-11-30 NOTE — ED Triage Notes (Signed)
Pt states he had an episode of vomiting today and his job is asking for a work note ?

## 2021-11-30 NOTE — ED Provider Notes (Signed)
? ?  [facility] ?Provider Note ? ? ? Event Date/Time  ? First MD Initiated Contact with Patient 11/30/21 1243   ?  (approximate) ? ? ?History  ? ?Letter for School/Work and Emesis ? ? ?HPI ? ?James Bartus. is a 36 y.o. male who reports he has a history of hepatitis B and C which has caused him to have nausea and vomiting intermittently over the last several months as well as fatigue and itchy skin.  He has not followed up with anyone for this.  Today he was feeling nauseated and his work made him come to the emergency department for a work note.  He denies abdominal pain at this time.  No fevers or chills ?  ? ? ?Physical Exam  ? ?Triage Vital Signs: ?ED Triage Vitals  ?Enc Vitals Group  ?   BP 11/30/21 1214 129/76  ?   Pulse Rate 11/30/21 1214 (!) 52  ?   Resp 11/30/21 1214 18  ?   Temp 11/30/21 1214 98.2 ?F (36.8 ?C)  ?   Temp Source 11/30/21 1214 Oral  ?   SpO2 11/30/21 1214 96 %  ?   Weight 11/30/21 1215 90.7 kg (200 lb)  ?   Height 11/30/21 1215 1.753 m (5\' 9" )  ?   Head Circumference --   ?   Peak Flow --   ?   Pain Score 11/30/21 1302 0  ?   Pain Loc --   ?   Pain Edu? --   ?   Excl. in GC? --   ? ? ?Most recent vital signs: ?Vitals:  ? 11/30/21 1214  ?BP: 129/76  ?Pulse: (!) 52  ?Resp: 18  ?Temp: 98.2 ?F (36.8 ?C)  ?SpO2: 96%  ? ? ? ?General: Awake, no distress.  ?CV:  Good peripheral perfusion.  ?Resp:  Normal effort.  ?Abd:  No distention.  No tenderness to palpation ?Other:   ? ? ?ED Results / Procedures / Treatments  ? ?Labs ?(all labs ordered are listed, but only abnormal results are displayed) ?Labs Reviewed - No data to display ? ? ?EKG ? ? ? ? ?RADIOLOGY ? ? ? ? ?PROCEDURES: ? ?Critical Care performed:  ? ?Procedures ? ? ?MEDICATIONS ORDERED IN ED: ?Medications - No data to display ? ? ?IMPRESSION / MDM / ASSESSMENT AND PLAN / ED COURSE  ?I reviewed the triage vital signs and the nursing notes. ? ?Reported history of hep B and C, discussed with patient the need for  outpatient follow-up with GI for management and possible treatment ? ?Patient is asymptomatic at this time, no abdominal pain, no nausea or vomiting, no diarrhea.  Vital signs and exam are unremarkable. ? ?We will refer to GI for follow-up ? ? ? ? ? ?  ? ? ?FINAL CLINICAL IMPRESSION(S) / ED DIAGNOSES  ? ?Final diagnoses:  ?Hepatitis B infection without delta agent without hepatic coma, unspecified chronicity  ? ? ? ?Rx / DC Orders  ? ?ED Discharge Orders   ? ? None  ? ?  ? ? ? ?Note:  This document was prepared using Dragon voice recognition software and may include unintentional dictation errors. ?  ?12/02/21, MD ?11/30/21 1524 ? ?

## 2021-12-04 ENCOUNTER — Other Ambulatory Visit: Payer: Self-pay

## 2021-12-04 ENCOUNTER — Emergency Department
Admission: EM | Admit: 2021-12-04 | Discharge: 2021-12-04 | Disposition: A | Discharge status: Home / Self Care | Payer: 59 | Attending: Emergency Medicine | Admitting: Emergency Medicine

## 2021-12-04 ENCOUNTER — Encounter: Payer: Self-pay | Admitting: Intensive Care

## 2021-12-04 DIAGNOSIS — R21 Rash and other nonspecific skin eruption: Secondary | ICD-10-CM | POA: Insufficient documentation

## 2021-12-04 DIAGNOSIS — Z5900 Homelessness unspecified: Secondary | ICD-10-CM | POA: Insufficient documentation

## 2021-12-04 MED ORDER — DOXYCYCLINE HYCLATE 100 MG PO TABS
100.0000 mg | ORAL_TABLET | Freq: Two times a day (BID) | ORAL | 0 refills | Status: DC
Start: 2021-12-04 — End: 2021-12-04

## 2021-12-04 MED ORDER — PREDNISONE 50 MG PO TABS: 50.0000 mg | ORAL_TABLET | Freq: Every day | ORAL | 0 refills | Status: AC

## 2021-12-04 MED ORDER — DOXYCYCLINE HYCLATE 100 MG PO TABS
100.0000 mg | ORAL_TABLET | Freq: Two times a day (BID) | ORAL | 0 refills | Status: AC
Start: 2021-12-04 — End: 2021-12-11

## 2021-12-04 NOTE — Discharge Instructions (Addendum)
-  Take all of your antibiotics as prescribed. ?-Take prednisone for treatment of the rash.  You may apply topical hydrocortisone to the affected areas as well that are not along the genitalia region.  ?-Avoid wet clothes for the next few days.  Keep the affected areas dry ?-Return to the emergency department anytime if you begin to experience any new or worsening symptoms. ?-Follow-up with your primary care provider as needed. ?

## 2021-12-04 NOTE — ED Triage Notes (Signed)
Patient c/o rash near private area that started last night.  ?

## 2021-12-04 NOTE — ED Provider Notes (Signed)
? ?[facility] ?Provider Note ? ? ? Event Date/Time  ? First MD Initiated Contact with Patient 12/04/21 1312   ?  (approximate) ? ? ?History  ? ?Chief Complaint ?Rash ? ? ?HPI ?James Beard. is a 36 y.o. male, history of opioid use disorder, bipolar 1, homelessness, hepatitis B, hepatitis C, presents to the emergency department for evaluation of rash.  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  He believes this is contributing to the rashes.  Current endorsing rash on the scrotum, glutes, chest area, as well as the upper legs.  He states that they burn and itch.  Denies any recent sexual activity, stating that he has "not had sex in over 3 years".  He has not attempted any treatments at home.  Denies fever/chills, chest pain, shortness of breath, abdominal pain, neck pain, headache, nausea/vomiting, penile discharge, numbness/tingling in upper or lower extremities, or cough/congestion. ? ?History Limitations: No limitations. ? ?  ? ? ?Physical Exam  ?Triage Vital Signs: ?ED Triage Vitals  ?Enc Vitals Group  ?   BP 12/04/21 1310 134/82  ?   Pulse Rate 12/04/21 1310 67  ?   Resp 12/04/21 1310 18  ?   Temp 12/04/21 1310 98.1 ?F (36.7 ?C)  ?   Temp Source 12/04/21 1310 Oral  ?   SpO2 12/04/21 1310 96 %  ?   Weight 12/04/21 1303 200 lb (90.7 kg)  ?   Height 12/04/21 1303 5\' 9"  (1.753 m)  ?   Head Circumference --   ?   Peak Flow --   ?   Pain Score 12/04/21 1303 7  ?   Pain Loc --   ?   Pain Edu? --   ?   Excl. in [place]? --   ? ? ?Most recent vital signs: ?Vitals:  ? 12/04/21 1310 12/04/21 1453  ?BP: 134/82 132/84  ?Pulse: 67 72  ?Resp: 18 18  ?Temp: 98.1 ?F (36.7 ?C) 98.4 ?F (36.9 ?C)  ?SpO2: 96% 98%  ? ? ?General: Awake, NAD.  ?Skin: Warm, dry.  ?CV: Good peripheral perfusion.  ?Resp: Normal effort.  ?Abd: Soft, non-tender. No distention.  ?Neuro: At baseline. No gross neurological deficits.  ?Other: Small, 1-3 cm  erythematous lesions scattered along the scrotum, penis, glutes, and upper chest.  No vesicles or bullae.  No active bleeding or discharge.  In addition, there are multiple superficial, scabbed lesions present along the hands and legs bilaterally, which the patient attributes to chronic skin picking that he does as a nervous habit ? ?Physical Exam ? ? ? ?ED Results / Procedures / Treatments  ?Labs ?(all labs ordered are listed, but only abnormal results are displayed) ?Labs Reviewed - No data to display ? ? ?EKG ?Not applicable. ? ? ?RADIOLOGY ? ?ED Provider Interpretation: Not applicable ? ?No results found. ? ?PROCEDURES: ? ?Critical Care performed: Not applicable. ? ?Procedures ? ? ? ?MEDICATIONS ORDERED IN ED: ?Medications - No data to display ? ? ?IMPRESSION / MDM / ASSESSMENT AND PLAN / ED COURSE  ?I reviewed the triage vital signs and the nursing notes. ?             ?               ? ?Differential diagnosis includes, but is not limited to, contact dermatitis, allergic dermatitis, cellulitis, eczema. ? ?Assessment/Plan ?Patient presents with multiple macular erythematous  rashes present along the groin area, glutes, and upper chest.  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  It does not appear fungal in nature.  Many of the other lesions that are reportedly chronic due to skin picking may potentially be staph infection as they appear quite erythematous around the bases.  Will cover for both.  Will prescribe patient doxycycline and prednisone.  Advised him to keep his clothes dry until the lesions resolve. ? ?Patient was provided with anticipatory guidance, return precautions, and educational material. Encouraged the patient to return to the emergency department at any time if they begin to experience any new or  worsening symptoms.  ? ?  ? ? ?FINAL CLINICAL IMPRESSION(S) / ED DIAGNOSES  ? ?Final diagnoses:  ?Rash  ? ? ? ?Rx / DC Orders  ? ?ED Discharge Orders   ? ?      Ordered  ?  predniSONE (DELTASONE) 50 MG tablet  Daily with breakfast       ? 12/04/21 1422  ?  doxycycline (VIBRA-TABS) 100 MG tablet  2 times daily,   Status:  Discontinued       ? 12/04/21 1422  ?  doxycycline (VIBRA-TABS) 100 MG tablet  2 times daily       ? 12/04/21 1422  ? ?  ?  ? ?  ? ? ? ?Note:  This document was prepared using Dragon voice recognition software and may include unintentional dictation errors. ?  ?Teodoro Spray, PA ?12/04/21 1600 ? ?  ?Vanessa [place], MD ?12/05/21 [phone] ? ?

## 2021-12-30 ENCOUNTER — Emergency Department: Payer: 59

## 2021-12-30 ENCOUNTER — Emergency Department
Admission: EM | Admit: 2021-12-30 | Discharge: 2021-12-30 | Disposition: A | Discharge status: Home / Self Care | Payer: 59 | Attending: Emergency Medicine | Admitting: Emergency Medicine

## 2021-12-30 ENCOUNTER — Other Ambulatory Visit: Payer: Self-pay

## 2021-12-30 ENCOUNTER — Encounter: Payer: Self-pay | Admitting: Emergency Medicine

## 2021-12-30 DIAGNOSIS — R1084 Generalized abdominal pain: Secondary | ICD-10-CM | POA: Diagnosis not present

## 2021-12-30 DIAGNOSIS — R112 Nausea with vomiting, unspecified: Secondary | ICD-10-CM | POA: Insufficient documentation

## 2021-12-30 LAB — COMPREHENSIVE METABOLIC PANEL
ALT: 19 U/L (ref 0–44)
AST: 23 U/L (ref 15–41)
Albumin: 3.9 g/dL (ref 3.5–5.0)
Alkaline Phosphatase: 103 U/L (ref 38–126)
Anion gap: 7 (ref 5–15)
BUN: 9 mg/dL (ref 6–20)
CO2: 31 mmol/L (ref 22–32)
Calcium: 9.1 mg/dL (ref 8.9–10.3)
Chloride: 101 mmol/L (ref 98–111)
Creatinine, Ser: 0.69 mg/dL (ref 0.61–1.24)
GFR, Estimated: >60 mL/min (ref >60)
Glucose, Bld: 92 mg/dL (ref 70–99)
Potassium: 4.5 mmol/L (ref 3.5–5.1)
Sodium: 139 mmol/L (ref 135–145)
Total Bilirubin: 0.5 mg/dL (ref 0.3–1.2)
Total Protein: 6.9 g/dL (ref 6.5–8.1)

## 2021-12-30 LAB — CBC
HCT: 37.9 % — ABNORMAL LOW (ref 39.0–52.0)
Hemoglobin: 11.9 g/dL — ABNORMAL LOW (ref 13.0–17.0)
MCH: 28.5 pg (ref 26.0–34.0)
MCHC: 31.4 g/dL (ref 30.0–36.0)
MCV: 90.9 fL (ref 80.0–100.0)
Platelets: 237 10*3/uL (ref 150–400)
RBC: 4.17 MIL/uL — ABNORMAL LOW (ref 4.22–5.81)
RDW: 13.4 % (ref 11.5–15.5)
WBC: 5.6 10*3/uL (ref 4.0–10.5)
nRBC: 0 % (ref 0.0–0.2)

## 2021-12-30 LAB — URINALYSIS, ROUTINE W REFLEX MICROSCOPIC
Bacteria, UA: NONE SEEN
Bilirubin Urine: NEGATIVE
Glucose, UA: NEGATIVE mg/dL
Hgb urine dipstick: NEGATIVE
Ketones, ur: NEGATIVE mg/dL
Nitrite: NEGATIVE
Protein, ur: NEGATIVE mg/dL
Specific Gravity, Urine: 1.018 (ref 1.005–1.030)
pH: 6 (ref 5.0–8.0)

## 2021-12-30 LAB — LIPASE, BLOOD: Lipase: 26 U/L (ref 11–51)

## 2021-12-30 MED ORDER — IOHEXOL 300 MG/ML  SOLN
100.0000 mL | Freq: Once | INTRAMUSCULAR | Status: AC | PRN
  Administered 2021-12-30: 100 mL via INTRAVENOUS
  Filled 2021-12-30: qty 100

## 2021-12-30 MED ORDER — ONDANSETRON HCL 4 MG PO TABS: 4.0000 mg | ORAL_TABLET | Freq: Three times a day (TID) | ORAL | 0 refills | Status: DC | PRN

## 2021-12-30 MED ORDER — LACTATED RINGERS IV BOLUS
1000.0000 mL | Freq: Once | INTRAVENOUS | Status: AC
  Administered 2021-12-30: 1000 mL via INTRAVENOUS

## 2021-12-30 MED ORDER — MORPHINE SULFATE (PF) 4 MG/ML IV SOLN
4.0000 mg | Freq: Once | INTRAVENOUS | Status: AC
  Administered 2021-12-30: 4 mg via INTRAVENOUS
  Filled 2021-12-30: qty 1

## 2021-12-30 MED ORDER — ONDANSETRON HCL 4 MG/2ML IJ SOLN
4.0000 mg | Freq: Once | INTRAMUSCULAR | Status: AC
  Administered 2021-12-30: 4 mg via INTRAVENOUS
  Filled 2021-12-30: qty 2

## 2021-12-30 MED ORDER — KETOROLAC TROMETHAMINE 30 MG/ML IJ SOLN
15.0000 mg | Freq: Once | INTRAMUSCULAR | Status: AC
  Administered 2021-12-30: 15 mg via INTRAVENOUS
  Filled 2021-12-30: qty 1

## 2021-12-30 NOTE — ED Triage Notes (Signed)
Pt to ED via POV stating that he has been vomiting x 2 days. Pt also states that this morning when he woke up he was having cold chills and was sweating. Pt is currently in NAD.  ?

## 2021-12-30 NOTE — ED Provider Notes (Signed)
? ?[facility] ?Provider Note ? ? ? Event Date/Time  ? First MD Initiated Contact with Patient 12/30/21 1606   ?  (approximate) ? ? ?History  ? ?Emesis and Chills ? ? ?HPI ? ?James Beard. is a 36 y.o. male with no significant past medical history here with abdominal pain.  The patient states that over the last day, he has had generalized abdominal pain, nausea, vomiting for 2 days.  He has had decreased appetite.  He states he has not wanted to eat or drink anything despite not having anything.  He has had some mild, generalized abdominal pain.  This is worse after vomiting.  He now reports it is mildly worse in the right side.  Denies any diarrhea or constipation.  No known sick contacts.  No recent medication changes.  No testicular pain or swelling. ?  ? ? ?Physical Exam  ? ?Triage Vital Signs: ?ED Triage Vitals  ?Enc Vitals Group  ?   BP 12/30/21 1433 126/83  ?   Pulse Rate 12/30/21 1430 (!) 52  ?   Resp 12/30/21 1430 16  ?   Temp 12/30/21 1433 98.7 ?F (37.1 ?C)  ?   Temp Source 12/30/21 1433 Oral  ?   SpO2 12/30/21 1430 97 %  ?   Weight 12/30/21 1429 200 lb (90.7 kg)  ?   Height 12/30/21 1429 5\' 9"  (1.753 m)  ?   Head Circumference --   ?   Peak Flow --   ?   Pain Score 12/30/21 1429 0  ?   Pain Loc --   ?   Pain Edu? --   ?   Excl. in [place]? --   ? ? ?Most recent vital signs: ?Vitals:  ? 12/30/21 1627 12/30/21 1751  ?BP: 120/68 122/78  ?Pulse: (!) 55 60  ?Resp: 16 16  ?Temp:    ?SpO2: 98% 98%  ? ? ? ?General: Awake, no distress.  ?CV:  Good peripheral perfusion.  ?Resp:  Normal effort.  ?Abd:  No distention.  Mild right lower quadrant tenderness, no rebound or guarding.  No peritoneal signs.  Negative Murphy's. ?Other:  No lower extremity swelling.  No rash. ? ? ?ED Results / Procedures / Treatments  ? ?Labs ?(all labs ordered are listed, but only abnormal results are displayed) ?Labs Reviewed  ?CBC - Abnormal; Notable for the following components:  ?    Result Value  ? RBC 4.17  (*)   ? Hemoglobin 11.9 (*)   ? HCT 37.9 (*)   ? All other components within normal limits  ?URINALYSIS, ROUTINE W REFLEX MICROSCOPIC - Abnormal; Notable for the following components:  ? Color, Urine YELLOW (*)   ? APPearance CLEAR (*)   ? Leukocytes,Ua TRACE (*)   ? All other components within normal limits  ?LIPASE, BLOOD  ?COMPREHENSIVE METABOLIC PANEL  ? ? ? ?EKG ? ? ? ?RADIOLOGY ?CT abdomen/pelvis: Normal appendix, minimal rectal hyperemia ? ? ?I also independently reviewed and agree with radiologist interpretations. ? ? ?PROCEDURES: ? ?Critical Care performed: No ? ? ?MEDICATIONS ORDERED IN ED: ?Medications  ?morphine (PF) 4 MG/ML injection 4 mg (4 mg Intravenous Given 12/30/21 1743)  ?ketorolac (TORADOL) 30 MG/ML injection 15 mg (15 mg Intravenous Given 12/30/21 1743)  ?ondansetron (ZOFRAN) injection 4 mg (4 mg Intravenous Given 12/30/21 1743)  ?lactated ringers bolus 1,000 mL (1,000 mLs Intravenous New Bag/Given 12/30/21 1744)  ?iohexol (OMNIPAQUE) 300 MG/ML solution 100 mL (100 mLs Intravenous Contrast  Given 12/30/21 1756)  ? ? ? ?IMPRESSION / MDM / ASSESSMENT AND PLAN / ED COURSE  ?I reviewed the triage vital signs and the nursing notes. ?             ?               ?Ddx:  ?Acute appendicitis, gastritis, colitis, viral GI illness, foodborne illness, UTI, IBS, IBD ? ? ?MDM:  ?36 year old male here with generalized abdominal pain.  On exam, he does have moderate right lower quadrant tenderness.  Lab work sent.  CBC shows no leukocytosis.  CMP with normal LFTs and renal function.  Lipase is normal.  Urinalysis negative for UTI or hematuria.  CT abdomen pelvis obtained, shows no acute abnormality.  There is minimal hyperemia of the rectum, but he has no rectal pain, diarrhea, suspect this is incidental.  Suspect likely viral GI illness.  Will treat symptomatically with good return precautions.  Work note provided. ? ? ?MEDICATIONS GIVEN IN ED: ?Medications  ?morphine (PF) 4 MG/ML injection 4 mg (4 mg Intravenous  Given 12/30/21 1743)  ?ketorolac (TORADOL) 30 MG/ML injection 15 mg (15 mg Intravenous Given 12/30/21 1743)  ?ondansetron (ZOFRAN) injection 4 mg (4 mg Intravenous Given 12/30/21 1743)  ?lactated ringers bolus 1,000 mL (1,000 mLs Intravenous New Bag/Given 12/30/21 1744)  ?iohexol (OMNIPAQUE) 300 MG/ML solution 100 mL (100 mLs Intravenous Contrast Given 12/30/21 1756)  ? ? ? ?Consults:  ? ? ? ?EMR reviewed  ? ? ? ? ? ?FINAL CLINICAL IMPRESSION(S) / ED DIAGNOSES  ? ?Final diagnoses:  ?Nausea and vomiting, unspecified vomiting type  ? ? ? ?Rx / DC Orders  ? ?ED Discharge Orders   ? ?      Ordered  ?  ondansetron (ZOFRAN) 4 MG tablet  Every 8 hours PRN       ? 12/30/21 1911  ? ?  ?  ? ?  ? ? ? ?Note:  This document was prepared using Dragon voice recognition software and may include unintentional dictation errors. ?  ?Shaune Pollack, MD ?12/30/21 1912 ? ?

## 2021-12-30 NOTE — ED Notes (Signed)
Patient declined discharge vital signs. 

## 2022-04-13 ENCOUNTER — Emergency Department
Admission: EM | Admit: 2022-04-13 | Discharge: 2022-04-13 | Disposition: A | Discharge status: Home / Self Care | Payer: Commercial Managed Care - HMO | Attending: Emergency Medicine | Admitting: Emergency Medicine

## 2022-04-13 ENCOUNTER — Other Ambulatory Visit: Payer: Self-pay

## 2022-04-13 DIAGNOSIS — F32A Depression, unspecified: Secondary | ICD-10-CM

## 2022-04-13 DIAGNOSIS — R45851 Suicidal ideations: Secondary | ICD-10-CM

## 2022-04-13 LAB — COMPREHENSIVE METABOLIC PANEL
ALT: 21 U/L (ref 0–44)
AST: 21 U/L (ref 15–41)
Albumin: 4.4 g/dL (ref 3.5–5.0)
Alkaline Phosphatase: 111 U/L (ref 38–126)
Anion gap: 8 (ref 5–15)
BUN: 13 mg/dL (ref 6–20)
CO2: 25 mmol/L (ref 22–32)
Calcium: 9.3 mg/dL (ref 8.9–10.3)
Chloride: 106 mmol/L (ref 98–111)
Creatinine, Ser: 0.8 mg/dL (ref 0.61–1.24)
GFR, Estimated: >60 mL/min (ref >60)
Glucose, Bld: 101 mg/dL — ABNORMAL HIGH (ref 70–99)
Potassium: 4.1 mmol/L (ref 3.5–5.1)
Sodium: 139 mmol/L (ref 135–145)
Total Bilirubin: 0.5 mg/dL (ref 0.3–1.2)
Total Protein: 7.7 g/dL (ref 6.5–8.1)

## 2022-04-13 LAB — CBC
HCT: 42.1 % (ref 39.0–52.0)
Hemoglobin: 13.8 g/dL (ref 13.0–17.0)
MCH: 28.7 pg (ref 26.0–34.0)
MCHC: 32.8 g/dL (ref 30.0–36.0)
MCV: 87.5 fL (ref 80.0–100.0)
Platelets: 275 10*3/uL (ref 150–400)
RBC: 4.81 MIL/uL (ref 4.22–5.81)
RDW: 13.8 % (ref 11.5–15.5)
WBC: 6.9 10*3/uL (ref 4.0–10.5)
nRBC: 0 % (ref 0.0–0.2)

## 2022-04-13 LAB — URINE DRUG SCREEN, QUALITATIVE (ARMC ONLY)
Amphetamines, Ur Screen: NOT DETECTED
Barbiturates, Ur Screen: NOT DETECTED
Benzodiazepine, Ur Scrn: NOT DETECTED
Cannabinoid 50 Ng, Ur ~~LOC~~: POSITIVE — AB
Cocaine Metabolite,Ur ~~LOC~~: NOT DETECTED
MDMA (Ecstasy)Ur Screen: NOT DETECTED
Methadone Scn, Ur: POSITIVE — AB
Opiate, Ur Screen: NOT DETECTED
Phencyclidine (PCP) Ur S: NOT DETECTED
Tricyclic, Ur Screen: NOT DETECTED

## 2022-04-13 LAB — ACETAMINOPHEN LEVEL: Acetaminophen (Tylenol), Serum: <10 ug/mL — ABNORMAL LOW (ref 10–30)

## 2022-04-13 LAB — ETHANOL: Alcohol, Ethyl (B): <10 mg/dL (ref <10)

## 2022-04-13 LAB — SALICYLATE LEVEL: Salicylate Lvl: <7 mg/dL — ABNORMAL LOW (ref 7.0–30.0)

## 2022-04-13 MED ORDER — IBUPROFEN 600 MG PO TABS
600.0000 mg | ORAL_TABLET | Freq: Once | ORAL | Status: AC
  Administered 2022-04-13: 600 mg via ORAL
  Filled 2022-04-13: qty 1

## 2022-04-13 MED ORDER — NICOTINE 21 MG/24HR TD PT24
21.0000 mg | MEDICATED_PATCH | Freq: Once | TRANSDERMAL | Status: DC
  Administered 2022-04-13: 21 mg via TRANSDERMAL
  Filled 2022-04-13: qty 1

## 2022-04-13 NOTE — ED Triage Notes (Signed)
Pt to ED POV for SI for past few months. When asked about plan, pt states there are several ways he could do it including jumping off a bridge, taking pills. States tried to hang himself about 5 years ago. States mother and grandmother recently passed away and friend recently murdered.  States took some of his dads methadone. Denies recent IV drug use.

## 2022-04-13 NOTE — Discharge Instructions (Addendum)
Strongly recommend that you make connection at [facility] [phone]) or [facility] [phone]) for mental health services.  You may also contact your insurance company for referral to outpatient mental health services. You should follow with medical provider as well.

## 2022-04-13 NOTE — ED Notes (Signed)
Meal given

## 2022-04-13 NOTE — ED Notes (Signed)
VOL/Rec D/C with resources

## 2022-04-13 NOTE — ED Notes (Addendum)
Pt dressed out with this RN and Abigail NT. Belongings include: Black Occupational psychologist Black socks [employer] boxers [employer] book bag [employer]

## 2022-04-13 NOTE — ED Notes (Signed)
Pt states that he is ready to go. Pt has been psych cleared and educated on outpatient resources per note. Dr. Derrill Kay is notified of pt request. This nurse has attempted to reach out to his father, Claudie, which has been ongoing through day as well. No answer and voicemail is full. Dr. Derrill Kay is going to work on DC paperwork shortly.

## 2022-04-13 NOTE — ED Notes (Signed)
Report received from Ally, RN including SBAR. Patient alert and oriented, warm and dry, and in no acute distress. Patient denies SI, HI, AVH and pain. Patient made aware of Q15 minute rounds and Rover and Officer presence for their safety. Patient instructed to come to this nurse with needs or concerns.  

## 2022-04-13 NOTE — BH Assessment (Signed)
Comprehensive Clinical Assessment (CCA) Screening, Triage and Referral Note  04/13/2022 Jansen Sciuto [id number]  Isabella Stalling, Montez Hageman. 36 year old male who presents to [facility] ED voluntarily for treatment. Per triage note, Pt to ED POV for SI for past few months. When asked about plan, pt states there are several ways he could do it including jumping off a bridge, taking pills. States tried to hang himself about 5 years ago. States mother and grandmother recently passed away and a friend recently murdered. States took some of his dad's methadone. Denies recent IV drug use.   During TTS assessment pt presents alert and oriented x 4, anxious but cooperative, and mood-congruent with affect. The pt does not appear to be responding to internal or external stimuli. Neither is the pt presenting with any delusional thinking. Pt verified the information provided to triage RN.   Pt identifies his main complaint to be that he needed someone to talk to. Patient reports having several life stressors that have caused him to be depressed. "My mom and grandmother both passed away and I had a friend who recently died." Patient states he is not currently working, does not have operable transportation and lives with his dad and step-mother. Patient reports he is also upset that he has not been able to see his kids for the past 7 years. Patient reports he is not taking any medications now but has in the past and does not feel it was beneficial. Patient admits to marijuana use and denies using alcohol. Pt reports INPT hx when he was 36 years old but none as an adult. Patient reports prior suicide attempts; however, patient was not hospitalized. Pt denies current SI/HI/AH/VH.    Per Sallye Ober, NP pt does not meet criteria for inpatient psychiatric admission.  Chief Complaint:  Chief Complaint  Patient presents with   Suicidal   Visit Diagnosis: Depression  Patient Reported Information How did you hear about [place]?  Self  What Is the Reason for Your Visit/Call Today? Patient reports he needed someone to talk to.  How Long Has This Been Causing You Problems? > than 6 months  What Do You Feel Would Help You the Most Today? -- (Assessment only)   Have You Recently Had Any Thoughts About Hurting Yourself? Yes  Are You Planning to Commit Suicide/Harm Yourself At This time? No   Have you Recently Had Thoughts About Hurting Someone Karolee Ohs? No  Are You Planning to Harm Someone at This Time? No  Explanation: No data recorded  Have You Used Any Alcohol or Drugs in the Past 24 Hours? Yes  How Long Ago Did You Use Drugs or Alcohol? No data recorded What Did You Use and How Much? Marijuana   Do You Currently Have a Therapist/Psychiatrist? No  Name of Therapist/Psychiatrist: No data recorded  Have You Been Recently Discharged From Any Office Practice or Programs? No  Explanation of Discharge From Practice/Program: No data recorded   CCA Screening Triage Referral Assessment Type of Contact: Face-to-Face  Telemedicine Service Delivery:   Is this Initial or Reassessment? No data recorded Date Telepsych consult ordered in [facility]:  No data recorded Time Telepsych consult ordered in [facility]:  No data recorded Location of Assessment: [facility] ED  Provider Location: [facility] ED   Collateral Involvement: None provided   Does Patient Have a Court Appointed Legal Guardian? No data recorded Name and Contact of Legal Guardian: No data recorded If Minor and Not Living with Parent(s), Who has Custody? n/a  Is CPS involved or ever been involved? Never  Is APS involved or ever been involved? Never   Patient Determined To Be At Risk for Harm To Self or Others Based on Review of Patient Reported Information or Presenting Complaint? No  Method: No data recorded Availability of Means: No data recorded Intent: No data recorded Notification Required: No data recorded Additional Information for Danger to Others  Potential: No data recorded Additional Comments for Danger to Others Potential: No data recorded Are There Guns or Other Weapons in Your Home? No data recorded Types of Guns/Weapons: No data recorded Are These Weapons Safely Secured?                            No data recorded Who Could Verify You Are Able To Have These Secured: No data recorded Do You Have any Outstanding Charges, Pending Court Dates, Parole/Probation? No data recorded Contacted To Inform of Risk of Harm To Self or Others: No data recorded  Does Patient Present under Involuntary Commitment? No  IVC Papers Initial File Date: No data recorded  [place] of Residence: [place]   Patient Currently Receiving the Following Services: Not Receiving Services   Determination of Need: Emergent (2 hours)   Options For Referral: ED Visit; Medication Management; Outpatient Therapy   Discharge Disposition:     Clerance Lav, Counselor, LCAS-A

## 2022-04-13 NOTE — ED Provider Notes (Signed)
[facility] Provider Note    Event Date/Time   First MD Initiated Contact with Patient 04/13/22 1051     (approximate)   History   Suicidal   HPI  James Enis. is a 36 y.o. male who comes in for SI for the past few months but reports that got worse today which is why he came in.  He reports the above plan to use a knife to cut his wrist.  He denies any attempts to do that.  Denies taking any medications today.  Does report that he took 1 mg of his dad's methadone yesterday.  He denies any other medical concerns or other alcohol or drug use other than marijuana.  Reviewed patient's admission from June 2020 where he came in with MSSA bacteremia   Physical Exam   Triage Vital Signs: ED Triage Vitals  Enc Vitals Group     BP 04/13/22 1029 (!) 169/96     Pulse Rate 04/13/22 1029 73     Resp 04/13/22 1029 18     Temp 04/13/22 1029 98.6 F (37 C)     Temp Source 04/13/22 1029 Oral     SpO2 04/13/22 1029 98 %     Weight --      Height 04/13/22 1031 5\' 9"  (1.753 m)     Head Circumference --      Peak Flow --      Pain Score 04/13/22 1031 0     Pain Loc --      Pain Edu? --      Excl. in [place]? --     Most recent vital signs: Vitals:   04/13/22 1029  BP: (!) 169/96  Pulse: 73  Resp: 18  Temp: 98.6 F (37 C)  SpO2: 98%     General: Awake, no distress.  CV:  Good peripheral perfusion.  Resp:  Normal effort.  Abd:  No distention.  Other:  Positive SI   ED Results / Procedures / Treatments   Labs (all labs ordered are listed, but only abnormal results are displayed) Labs Reviewed  COMPREHENSIVE METABOLIC PANEL - Abnormal; Notable for the following components:      Result Value   Glucose, Bld 101 (*)    All other components within normal limits  CBC  ETHANOL  SALICYLATE LEVEL  ACETAMINOPHEN LEVEL  URINE DRUG SCREEN, QUALITATIVE ([facility] ONLY)    MEDICATIONS ORDERED IN ED: Medications - No data to display   IMPRESSION /  MDM / ASSESSMENT AND PLAN / ED COURSE  I reviewed the triage vital signs and the nursing notes.   Patient's presentation is most consistent with acute presentation with potential threat to life or bodily function.   Pt is without any acute medical complaints. No exam findings to suggest medical cause of current presentation. Will order psychiatric screening labs and discuss further w/ psychiatric service.  Patient is willing to stay voluntary at this time.  D/d includes but is not limited to psychiatric disease, behavioral/personality disorder, inadequate socioeconomic support, medical.  Based on HPI, exam, unremarkable labs, no concern for acute medical problem at this time. No rigidity, clonus, hyperthermia, focal neurologic deficit, diaphoresis, tachycardia, meningismus, ataxia, gait abnormality or other finding to suggest this visit represents a non-psychiatric problem. Screening labs reviewed.    Given this, pt medically cleared, to be dispositioned per Psych.    The patient has been placed in psychiatric observation due to the need to provide a safe environment for  the patient while obtaining psychiatric consultation and evaluation, as well as ongoing medical and medication management to treat the patient's condition.  The patient has not been placed under full IVC at this time.   CBC reassuring with normal white count.  CMP reassuring.  Tylenol salicylate negative  The patient is on the cardiac monitor to evaluate for evidence of arrhythmia and/or significant heart rate changes.      FINAL CLINICAL IMPRESSION(S) / ED DIAGNOSES   Final diagnoses:  Suicidal ideation     Rx / DC Orders   ED Discharge Orders     None        Note:  This document was prepared using Dragon voice recognition software and may include unintentional dictation errors.   Concha Se, MD 04/13/22 [phone]

## 2022-04-13 NOTE — ED Notes (Signed)
Pt returned bags 1 and 2 out of 2 of personal belongings.

## 2022-04-13 NOTE — Consult Note (Addendum)
[facility] Consult   Reason for Consult:  Depression Referring Physician:  Jari Pigg Patient Identification: James Beard. MRN:  [id number] Principal Diagnosis: Depression Diagnosis:  Principal Problem:   Depression   Total Time spent with patient: 45 minutes  Subjective: "I've been depressed for several months" James Bramblett. is a 36 y.o. male patient admitted with increased depression.  HPI:  Patient presents to ED with increased depression and passive thoughts of suicide for the last few months in the context of recent death of his mother and grandmother.  Patient also reports social stressors of lack of finances, job loss, inadequate transportation.  Patient states that he lives with his father and stepmother. On evaluation, patient denies that he is currently suicidal.  He denies current plan or intent of self-harm or suicide.  Patient denies that he had any plan to cut himself with the knife.  He does state that he was psychiatrically hospitalized when he was 5 or 11 but he does not remember exactly why.  He does report fastening a belt around his neck about 6 years ago, but attempt was aborted. denies auditory or visual hallucinations or paranoia.  He is lucid, speaking in clear, coherent sentence sentences.  He does appear somewhat depressed.  He states that he took his dad's methadone to try and alleviate his  back pain. He reports that he used to get methadone at a clinic in [place] for chronic back pain, but the government grant ran out and he could not afford it anymore. He reports that last time he got it there was around May 2022. Patient admits to prior IV drug use but says he has  "been clean" for 3 years, UDS positive for cannabinoids and methadone only.  Patient reports forward thinking plans.  He wants to get his car fixed and get a job.  He wants to make contact with his children; he states his ex-wife is keeping from him and he does not know where  they are.  Patient reports his reason for coming into the hospital was to "talk to somebody."  He states that he has tried antidepressants in the past and he never likes the way they make him feel.  Patient does express some hope for the future, but it appears as though he is focused on "everything bad happening to me" at this time.  Discussed outpatient services for therapy.  Resources to [facility] given to patient, explaining that they can provide group and sometimes individual therapy, along with medication management if he chooses to explore that option.  Patient is in agreement with plan.  Also encouraged patient to see outpatient medical providers as he states that he is positive for hepatitis B and C and states he has not been on medication for these.  Writer attempted to get collateral from patient's father.  No answer at father's number and mailbox was full; unable to leave voice message.[phone]).          Past Psychiatric History: Patient reports hospitalization when he was about 36 years old.  No hospitalizations as an adult.  Patient reports that he has tried to harm himself in the distant past x 1.  No medical attention received at that time.  Risk to Self:   Risk to Others:   Prior Inpatient Therapy:   Prior Outpatient Therapy:    Past Medical History:  Past Medical History:  Diagnosis Date   Bipolar 1 disorder ([place])    Eczema  Hepatitis B antibody positive    Hepatitis C    Homelessness    IV drug abuse ([place])    MRSA (methicillin resistant staph aureus) culture positive     Past Surgical History:  Procedure Laterality Date   I & D EXTREMITY Bilateral 01/08/2019   Procedure: IRRIGATION AND DEBRIDEMENT OF ABSCESS RIGHT KNEE AND LEFT ANKLE;  Surgeon: Erle Crocker, MD;  Location: [facility];  Service: Orthopedics;  Laterality: Bilateral;  RIGHT KNEE AND LEFT ANKLE   NO PAST SURGERIES     spinal abcess     Family History:  Family History  Problem Relation Age of Onset    Diabetes Mother    Hypertension Mother    Depression Mother    Diabetes Father    Hypertension Father    Depression Father    Family Psychiatric  History: Substance abuse, both parents Social History:  Social History   Substance and Sexual Activity  Alcohol Use Not Currently     Social History   Substance and Sexual Activity  Drug Use Yes   Types: Cocaine, Marijuana, Methamphetamines, IV   Comment: Pt states that he uses marijuana, has not used other drugs in about 3 years    Social History   Socioeconomic History   Marital status: Single    Spouse name: Not on file   Number of children: Not on file   Years of education: Not on file   Highest education level: Not on file  Occupational History   Not on file  Tobacco Use   Smoking status: Every Day    Packs/day: 1.00    Types: Cigarettes   Smokeless tobacco: Never  Vaping Use   Vaping Use: Never used  Substance and Sexual Activity   Alcohol use: Not Currently   Drug use: Yes    Types: Cocaine, Marijuana, Methamphetamines, IV    Comment: Pt states that he uses marijuana, has not used other drugs in about 3 years   Sexual activity: Not Currently  Other Topics Concern   Not on file  Social History Narrative   ** Merged History Encounter **       ** Merged History Encounter **       Social Determinants of Health   Financial Resource Strain: Not on file  Food Insecurity: Not on file  Transportation Needs: Not on file  Physical Activity: Not on file  Stress: Not on file  Social Connections: Not on file   Additional Social History:    Allergies:  No Known Allergies  Labs:  Results for orders placed or performed during the hospital encounter of 04/13/22 (from the past 48 hour(s))  Comprehensive metabolic panel     Status: Abnormal   Collection Time: 04/13/22 10:35 AM  Result Value Ref Range   Sodium 139 135 - 145 mmol/L   Potassium 4.1 3.5 - 5.1 mmol/L   Chloride 106 98 - 111 mmol/L   CO2 25 22 - 32  mmol/L   Glucose, Bld 101 (H) 70 - 99 mg/dL    Comment: Glucose reference range applies only to samples taken after fasting for at least 8 hours.   BUN 13 6 - 20 mg/dL   Creatinine, Ser 0.80 0.61 - 1.24 mg/dL   Calcium 9.3 8.9 - 10.3 mg/dL   Total Protein 7.7 6.5 - 8.1 g/dL   Albumin 4.4 3.5 - 5.0 g/dL   AST 21 15 - 41 U/L   ALT 21 0 - 44 U/L   Alkaline  Phosphatase 111 38 - 126 U/L   Total Bilirubin 0.5 0.3 - 1.2 mg/dL   GFR, Estimated >94 >17 mL/min    Comment: (NOTE) Calculated using the CKD-EPI Creatinine Equation (2021)    Anion gap 8 5 - 15    Comment: Performed at [facility], [address] [place], [place] [address]  Ethanol     Status: None   Collection Time: 04/13/22 10:35 AM  Result Value Ref Range   Alcohol, Ethyl (B) <10 <10 mg/dL    Comment: (NOTE) Lowest detectable limit for serum alcohol is 10 mg/dL.  For medical purposes only. Performed at [facility], [address] [place], [place] [address]   Salicylate level     Status: Abnormal   Collection Time: 04/13/22 10:35 AM  Result Value Ref Range   Salicylate Lvl <7.0 (L) 7.0 - 30.0 mg/dL    Comment: Performed at [facility], [address] [place], [place] [address]  Acetaminophen level     Status: Abnormal   Collection Time: 04/13/22 10:35 AM  Result Value Ref Range   Acetaminophen (Tylenol), Serum <10 (L) 10 - 30 ug/mL    Comment: (NOTE) Therapeutic concentrations vary significantly. A range of 10-30 ug/mL  may be an effective concentration for many patients. However, some  are best treated at concentrations outside of this range. Acetaminophen concentrations >150 ug/mL at 4 hours after ingestion  and >50 ug/mL at 12 hours after ingestion are often associated with  toxic reactions.  Performed at [facility], [address] [place], [place] [address]   cbc     Status: None   Collection Time: 04/13/22 10:35 AM  Result Value Ref Range   WBC 6.9 4.0 - 10.5  K/uL   RBC 4.81 4.22 - 5.81 MIL/uL   Hemoglobin 13.8 13.0 - 17.0 g/dL   HCT 78.5 88.5 - 02.7 %   MCV 87.5 80.0 - 100.0 fL   MCH 28.7 26.0 - 34.0 pg   MCHC 32.8 30.0 - 36.0 g/dL   RDW 74.1 28.7 - 86.7 %   Platelets 275 150 - 400 K/uL   nRBC 0.0 0.0 - 0.2 %    Comment: Performed at [facility], [address], [place], [place] [address]  Urine Drug Screen, Qualitative     Status: Abnormal   Collection Time: 04/13/22 10:35 AM  Result Value Ref Range   Tricyclic, Ur Screen NONE DETECTED NONE DETECTED   Amphetamines, Ur Screen NONE DETECTED NONE DETECTED   MDMA (Ecstasy)Ur Screen NONE DETECTED NONE DETECTED   Cocaine Metabolite,Ur [place] NONE DETECTED NONE DETECTED   Opiate, Ur Screen NONE DETECTED NONE DETECTED   Phencyclidine (PCP) Ur S NONE DETECTED NONE DETECTED   Cannabinoid 50 Ng, Ur [place] POSITIVE (A) NONE DETECTED   Barbiturates, Ur Screen NONE DETECTED NONE DETECTED   Benzodiazepine, Ur Scrn NONE DETECTED NONE DETECTED   Methadone Scn, Ur POSITIVE (A) NONE DETECTED    Comment: (NOTE) Tricyclics + metabolites, urine    Cutoff 1000 ng/mL Amphetamines + metabolites, urine  Cutoff 1000 ng/mL MDMA (Ecstasy), urine              Cutoff 500 ng/mL Cocaine Metabolite, urine          Cutoff 300 ng/mL Opiate + metabolites, urine        Cutoff 300 ng/mL Phencyclidine (PCP), urine         Cutoff 25 ng/mL Cannabinoid, urine  Cutoff 50 ng/mL Barbiturates + metabolites, urine  Cutoff 200 ng/mL Benzodiazepine, urine              Cutoff 200 ng/mL Methadone, urine                   Cutoff 300 ng/mL  The urine drug screen provides only a preliminary, unconfirmed analytical test result and should not be used for non-medical purposes. Clinical consideration and professional judgment should be applied to any positive drug screen result due to possible interfering substances. A more specific alternate chemical method must be used in order to obtain a confirmed analytical  result. Gas chromatography / mass spectrometry (GC/MS) is the preferred confirm atory method. Performed at [facility], [address] [place], [place] [address]     No current facility-administered medications for this encounter.   Current Outpatient Medications  Medication Sig Dispense Refill   OLANZapine (ZYPREXA) 2.5 MG tablet Take 1 tablet (2.5 mg total) by mouth at bedtime. (Patient not taking: Reported on 02/19/2019) 30 tablet 3   ondansetron (ZOFRAN) 4 MG tablet Take 1 tablet (4 mg total) by mouth every 8 (eight) hours as needed for nausea or vomiting. 12 tablet 0   sertraline (ZOLOFT) 100 MG tablet Take 1 tablet (100 mg total) by mouth daily. (Patient not taking: Reported on 02/19/2019) 30 tablet 3    Musculoskeletal: Strength & Muscle Tone: within normal limits Gait & Station: normal Patient leans: N/A     Psychiatric Specialty Exam:  Presentation  General Appearance: Appropriate for Environment Eye Contact:Good Speech:Clear and Coherent Speech Volume:Normal Handedness:No data recorded  Mood and Affect  Mood:Depressed Affect:Congruent  Thought Process  Thought Processes:Coherent Descriptions of Associations:Intact  Orientation:Full (Time, Place and Person)  Thought Content:WDL  History of Schizophrenia/Schizoaffective disorder:No data recorded Duration of Psychotic Symptoms:No data recorded Hallucinations:Hallucinations: None  Ideas of Reference:None  Suicidal Thoughts:Suicidal Thoughts: No  Homicidal Thoughts:Homicidal Thoughts: No   Sensorium  Memory:Immediate Good; Recent Good Judgment:No data recorded Insight:Fair  Executive Functions  Concentration:Good Attention Span:Good [place] of Knowledge:Good Language:No data recorded  Psychomotor Activity  Psychomotor Activity:Psychomotor Activity: Normal  Assets  Assets:Desire for Improvement; Financial Resources/Insurance; Housing; Resilience; Social Support  Sleep   Sleep:Sleep: Good  Physical Exam: Physical Exam Vitals and nursing note reviewed.  HENT:     Head: Normocephalic.     Nose: No congestion or rhinorrhea.  Eyes:     General:        Right eye: No discharge.        Left eye: No discharge.  Cardiovascular:     Rate and Rhythm: Normal rate.  Pulmonary:     Effort: Pulmonary effort is normal.  Musculoskeletal:        General: Normal range of motion.     Cervical back: Normal range of motion.  Neurological:     Mental Status: He is alert and oriented to person, place, and time.  Psychiatric:        Attention and Perception: Attention normal.        Mood and Affect: Affect is blunt and flat.        Speech: Speech normal.        Behavior: Behavior normal.        Thought Content: Thought content normal. Thought content is not paranoid or delusional. Thought content does not include homicidal or suicidal ideation.        Cognition and Memory: Cognition normal.        Judgment:  Judgment normal.    Review of Systems  Constitutional: Negative.   Respiratory: Negative.    Cardiovascular: Negative.   Musculoskeletal: Negative.   Skin: Negative.   Psychiatric/Behavioral:  Positive for depression (describes chronic/stable) and substance abuse. Negative for hallucinations, memory loss and suicidal ideas. The patient is not nervous/anxious and does not have insomnia.    Blood pressure (!) 169/96, pulse 73, temperature 98.6 F (37 C), temperature source Oral, resp. rate 18, height 5\' 9"  (1.753 m), SpO2 98 %. Body mass index is 29.53 kg/m.  Treatment Plan Summary: Plan : Discharge home with outpatient resources.  Strongly advised patient to follow-up with outpatient medical, and mental health providers.  Information provided, [facility].  reviewed with the EDP.  Disposition: No evidence of imminent risk to self or others at present.   Supportive therapy provided about ongoing stressors. Discussed crisis plan, support from social network, calling  911, coming to the Emergency Department, and calling Suicide Hotline.  , NP 04/13/2022 1:02 PM Need to be treated for these

## 2022-06-03 ENCOUNTER — Encounter: Payer: Self-pay | Admitting: Nurse Practitioner

## 2022-06-03 ENCOUNTER — Ambulatory Visit (INDEPENDENT_AMBULATORY_CARE_PROVIDER_SITE_OTHER)
Admission: RE | Admit: 2022-06-03 | Discharge: 2022-06-03 | Disposition: A | Discharge status: Home / Self Care | Payer: Commercial Managed Care - HMO | Source: Ambulatory Visit | Attending: Nurse Practitioner | Admitting: Nurse Practitioner

## 2022-06-03 ENCOUNTER — Ambulatory Visit (INDEPENDENT_AMBULATORY_CARE_PROVIDER_SITE_OTHER): Payer: Commercial Managed Care - HMO | Admitting: Nurse Practitioner

## 2022-06-03 VITALS — BP 130/72 | HR 55 | Temp 97.5°F | Resp 14 | Ht 69.0 in | Wt 180.2 lb

## 2022-06-03 DIAGNOSIS — F331 Major depressive disorder, recurrent, moderate: Secondary | ICD-10-CM

## 2022-06-03 DIAGNOSIS — M546 Pain in thoracic spine: Secondary | ICD-10-CM

## 2022-06-03 DIAGNOSIS — M654 Radial styloid tenosynovitis [de Quervain]: Secondary | ICD-10-CM | POA: Insufficient documentation

## 2022-06-03 DIAGNOSIS — Z7689 Persons encountering health services in other specified circumstances: Secondary | ICD-10-CM

## 2022-06-03 DIAGNOSIS — E663 Overweight: Secondary | ICD-10-CM | POA: Diagnosis not present

## 2022-06-03 DIAGNOSIS — Z23 Encounter for immunization: Secondary | ICD-10-CM

## 2022-06-03 DIAGNOSIS — M479 Spondylosis, unspecified: Secondary | ICD-10-CM | POA: Diagnosis not present

## 2022-06-03 DIAGNOSIS — B1920 Unspecified viral hepatitis C without hepatic coma: Secondary | ICD-10-CM | POA: Diagnosis not present

## 2022-06-03 DIAGNOSIS — G8929 Other chronic pain: Secondary | ICD-10-CM

## 2022-06-03 DIAGNOSIS — Z720 Tobacco use: Secondary | ICD-10-CM | POA: Insufficient documentation

## 2022-06-03 DIAGNOSIS — E049 Nontoxic goiter, unspecified: Secondary | ICD-10-CM | POA: Diagnosis not present

## 2022-06-03 DIAGNOSIS — Q845 Enlarged and hypertrophic nails: Secondary | ICD-10-CM | POA: Insufficient documentation

## 2022-06-03 LAB — CBC
HCT: 37.6 % — ABNORMAL LOW (ref 39.0–52.0)
Hemoglobin: 12.8 g/dL — ABNORMAL LOW (ref 13.0–17.0)
MCHC: 34.1 g/dL (ref 30.0–36.0)
MCV: 88.3 fl (ref 78.0–100.0)
Platelets: 258 10*3/uL (ref 150.0–400.0)
RBC: 4.26 Mil/uL (ref 4.22–5.81)
RDW: 14.1 % (ref 11.5–15.5)
WBC: 5.8 10*3/uL (ref 4.0–10.5)

## 2022-06-03 LAB — COMPREHENSIVE METABOLIC PANEL
ALT: 16 U/L (ref 0–53)
AST: 22 U/L (ref 0–37)
Albumin: 4.5 g/dL (ref 3.5–5.2)
Alkaline Phosphatase: 106 U/L (ref 39–117)
BUN: 15 mg/dL (ref 6–23)
CO2: 26 mEq/L (ref 19–32)
Calcium: 9.3 mg/dL (ref 8.4–10.5)
Chloride: 105 mEq/L (ref 96–112)
Creatinine, Ser: 0.74 mg/dL (ref 0.40–1.50)
GFR: 115.52 mL/min (ref >60.00)
Glucose, Bld: 94 mg/dL (ref 70–99)
Potassium: 4 mEq/L (ref 3.5–5.1)
Sodium: 140 mEq/L (ref 135–145)
Total Bilirubin: 0.3 mg/dL (ref 0.2–1.2)
Total Protein: 6.8 g/dL (ref 6.0–8.3)

## 2022-06-03 LAB — POC URINALSYSI DIPSTICK (AUTOMATED)
Bilirubin, UA: NEGATIVE
Blood, UA: NEGATIVE
Glucose, UA: NEGATIVE
Ketones, UA: NEGATIVE
Leukocytes, UA: NEGATIVE
Nitrite, UA: NEGATIVE
Protein, UA: POSITIVE — AB
Spec Grav, UA: >=1.03 — AB (ref 1.010–1.025)
Urobilinogen, UA: 0.2 E.U./dL
pH, UA: 6 (ref 5.0–8.0)

## 2022-06-03 LAB — HEMOGLOBIN A1C: Hgb A1c MFr Bld: 5.7 % (ref 4.6–6.5)

## 2022-06-03 LAB — TSH: TSH: 0.8 u[IU]/mL (ref 0.35–5.50)

## 2022-06-03 MED ORDER — PREDNISONE 20 MG PO TABS: ORAL_TABLET | ORAL | 0 refills | Status: AC

## 2022-06-03 NOTE — Assessment & Plan Note (Signed)
PHQ-9 and GAD-7 administered in office.  Both positive.  Patient denies HI/SI/AVH.  If liver functions come back normal we will consider starting patient on duloxetine to help with anxiety/depression and chronic pain.

## 2022-06-03 NOTE — Assessment & Plan Note (Signed)
Incidental finding on exam.  Discussed importance of getting ultrasound.  Ultrasound order placed today and information given to patient in office

## 2022-06-03 NOTE — Assessment & Plan Note (Signed)
Hypertrophic and possibly fungal disease nails.  We will send to podiatry to trim and treat.  Patient states he has history of hepatitis C and unsure of liver functions I do not feel comfortable writing terbinafine at this juncture

## 2022-06-03 NOTE — Assessment & Plan Note (Signed)
Pending antibody and quantitative numbers.  If positive will refer to GI did discuss with patient already

## 2022-06-03 NOTE — Assessment & Plan Note (Signed)
Patient's complaint of pain in his thoracic spine.  He does have tenderness on palpation.  Has been on methadone in the past.  Pending x-rays.  Pending liver function, if liver function is okay we will consider putting patient on duloxetine to help with anxiety, depression, and chronic pain.

## 2022-06-03 NOTE — Progress Notes (Signed)
New Patient Office Visit  Subjective    Patient ID: James Beard., male    DOB: [birth year]  Age: 36 y.o. MRN: [id number]  CC:  Chief Complaint  Patient presents with   Establish Care    Previous PCP with [facility] in  2020   Back Pain    Was diagnosed with arthritis of the back in 2021 in [place], [place], thinks its in C spine and DDD, pain is present daily, constant-has tried heating pad, was going to Methadone clinic for about 2 years for the pain but became a hassle to go there.     HPI James Beard. presents to establish care   Hep C: active infection. Has not had treatment per his report.  He does have a history of IV drug use but has been sober since 2020.  OA: States that he has had OA in the past he was told that his middle spine. Has been on methadone. Has done ibuprofen in the past with some relief. Has tried prednisone  OUD: Has been taking methadone. 10mg  a day for approx 1 month.  States that his father is currently on the medication on a take-home status and he was given about 10 mg today.  Tobacco use: has wanted to quit. Has been quit 4 months before while in jail   Back pain: States that he has had issues for approx 15-16 years. States he was dx with arthritis . States he injryed his bak at 73 and when he was mowing grass  History of migraines: states that happens. States once every few month. Bilateral temple. Throbbing pulsing pain.Has squilles in vision. Has light and sound sensativity. States if he goes to sleep it helps  Outpatient Encounter Medications as of 06/03/2022  Medication Sig   predniSONE (DELTASONE) 20 MG tablet Take 1 tablet (20 mg total) by mouth 2 (two) times daily with a meal for 3 days, THEN 1 tablet (20 mg total) daily with breakfast for 3 days. Avoid NSAID use like: ibuprofen, motrin, aleve, naproxen, BC/Goody powder.   [DISCONTINUED] OLANZapine (ZYPREXA) 2.5 MG tablet Take 1 tablet (2.5 mg total) by mouth at  bedtime. (Patient not taking: Reported on 02/19/2019)   [DISCONTINUED] ondansetron (ZOFRAN) 4 MG tablet Take 1 tablet (4 mg total) by mouth every 8 (eight) hours as needed for nausea or vomiting. (Patient not taking: Reported on 04/13/2022)   [DISCONTINUED] sertraline (ZOLOFT) 100 MG tablet Take 1 tablet (100 mg total) by mouth daily. (Patient not taking: Reported on 02/19/2019)   No facility-administered encounter medications on file as of 06/03/2022.    Past Medical History:  Diagnosis Date   Arthritis of back    Bipolar 1 disorder ([place])    Eczema    Fracture of thumb    right thumb, at age 47   Hepatitis B antibody positive    Hepatitis C    History of homeless    IV drug abuse ([place])    MRSA (methicillin resistant staph aureus) culture positive     Past Surgical History:  Procedure Laterality Date   I & D EXTREMITY Bilateral 01/08/2019   Procedure: IRRIGATION AND DEBRIDEMENT OF ABSCESS RIGHT KNEE AND LEFT ANKLE;  Surgeon: Erle Crocker, MD;  Location: [facility];  Service: Orthopedics;  Laterality: Bilateral;  RIGHT KNEE AND LEFT ANKLE    Family History  Problem Relation Age of Onset   Diabetes Mother    Hypertension Mother    Depression Mother  Diabetes Father    Hypertension Father    Depression Father    Depression Sister    Depression Sister    Schizophrenia Maternal Great-grandfather     Social History   Socioeconomic History   Marital status: Married    Spouse name: Not on file   Number of children: 2   Years of education: Not on file   Highest education level: Not on file  Occupational History   Not on file  Tobacco Use   Smoking status: Every Day    Packs/day: 1.00    Years: 28.00    Total pack years: 28.00    Types: Cigarettes   Smokeless tobacco: Never  Vaping Use   Vaping Use: Never used  Substance and Sexual Activity   Alcohol use: Not Currently   Drug use: Not Currently    Types: Cocaine, Marijuana, Methamphetamines, IV, Heroin, Fentanyl     Comment: quit 02/22/2019. 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  Respiratory:  Negative for shortness of breath.   Cardiovascular:  Negative for chest pain and leg swelling.  Gastrointestinal:  Negative for abdominal pain, nausea and vomiting.       Bm daily  Genitourinary:  Negative for dysuria and hematuria.  Neurological:  Positive for tingling (in bilateral fingers that is intermittent). Negative for headaches.  Psychiatric/Behavioral:  Negative for hallucinations and suicidal ideas.         Objective    BP 130/72   Pulse (!) 55   Temp (!) 97.5 F (36.4 C) (Temporal)   Resp 14   Ht 5\' 9"  (1.753 m)   Wt 180 lb 4 oz (81.8 kg)   SpO2 95%   BMI 26.62 kg/m   Physical Exam Vitals and nursing note reviewed.  Constitutional:      Appearance: Normal appearance.  HENT:     Right Ear: Tympanic membrane, ear canal and external ear normal.     Left Ear: Tympanic membrane, ear canal and external ear normal.     Mouth/Throat:     Mouth: Mucous membranes are moist.     Pharynx: Oropharynx is clear.     Comments: Uvula looks high and curved Neck:     Thyroid: Thyromegaly present.  Cardiovascular:     Rate and Rhythm: Normal rate and regular rhythm.     Heart sounds: Normal heart sounds.  Pulmonary:     Effort: Pulmonary effort is normal.     Breath sounds: Normal breath sounds.  Musculoskeletal:     Thoracic back: Bony tenderness present.     Right lower  leg: No edema.     Left lower leg: No edema.       Feet:     Comments: Patient kyphotic in office   Positive Finkelstein test on right side  Feet:     Comments: Hypertrophic nails x 10. Bilateral great toes the largest. With discoloration  Lymphadenopathy:     Cervical: Cervical adenopathy present.  Skin:    General: Skin is warm.  Neurological:     General: No focal deficit present.     Mental Status: He is alert.     Deep Tendon Reflexes:     Reflex Scores:      Bicep reflexes are 2+ on the right side and 2+ on the left side.      Patellar reflexes are 2+ on the right side and 2+ on the left side.    Comments: Bilateral upper and lower extremity strength 5/5         Assessment & Plan:   Problem List Items Addressed This Visit       Digestive   Hepatitis C    Pending antibody and quantitative numbers.  If positive will refer to GI did discuss with patient already      Relevant Orders   Hepatitis C RNA quantitative   Hepatitis C Antibody     Endocrine   Enlarged thyroid    Incidental finding on exam.  Discussed importance of getting ultrasound.  Ultrasound order placed today and information given to patient in office      Relevant Orders   TSH (Completed)   US THYROID     Musculoskeletal and Integument   Arthritis of back    Historical diagnosis pending imaging today      Relevant Medications   predniSONE (DELTASONE) 20 MG tablet   De Quervain's tenosynovitis, right    Denies of the right wrist/hand.  We will treat with prednisone 20 mg taper.  NSAID precautions reviewed      Relevant Medications   predniSONE (DELTASONE) 20 MG tablet   Enlarged and hypertrophic nails    Hypertrophic and possibly fungal disease nails.  We will send to podiatry to trim and treat.  Patient states he has history of hepatitis C and unsure of liver functions I do not feel comfortable writing terbinafine at this juncture      Relevant Orders   Ambulatory referral to  Podiatry     Other   Depression    PHQ-9 and GAD-7 administered in office.  Both positive.  Patient denies HI/SI/AVH.  If liver functions come back normal we will consider starting patient on duloxetine to help with anxiety/depression and chronic pain.      Tobacco use    UA in office      Relevant Orders   POCT Urinalysis Dipstick (Automated) (Completed)   Chronic thoracic spine pain    Patient's complaint of pain in his thoracic spine.  He does have tenderness on palpation.  Has been on methadone in the past.  Pending x-rays.  Pending liver function, if liver function is okay we will consider putting patient on duloxetine to help with anxiety, depression, and chronic pain.      Relevant Medications   predniSONE (DELTASONE) 20 MG tablet   Other Relevant Orders   DG Thoracic Spine W/Swimmers   Ambulatory referral to Pain Clinic   Overweight   Relevant Orders   Hemoglobin A1c (Completed)   Other Visit Diagnoses     Need for immunization against influenza    -  Primary   Relevant Orders   Flu Vaccine QUAD 6+ mos PF IM (Fluarix Quad PF) (Completed)   Establishing care with new doctor, encounter for       Relevant Orders   CBC (Completed)   Comprehensive metabolic panel (Completed)  Return depends on lab results.   Romilda Garret, NP

## 2022-06-03 NOTE — Assessment & Plan Note (Signed)
Historical diagnosis pending imaging today

## 2022-06-03 NOTE — Assessment & Plan Note (Signed)
Denies of the right wrist/hand.  We will treat with prednisone 20 mg taper.  NSAID precautions reviewed

## 2022-06-03 NOTE — Assessment & Plan Note (Signed)
UA in office. 

## 2022-06-03 NOTE — Patient Instructions (Signed)
Nice to see you today You need to call and get the Ultrasound of your thyroid done I will be in touch with the labs today. If the liver is ok we will start cymbalta

## 2022-06-09 LAB — HCV RNA,QUANTITATIVE REAL TIME PCR
HCV Quantitative Log: <1.18 Log IU/mL
HCV RNA, PCR, QN: <15 IU/mL

## 2022-06-09 LAB — HEPATITIS C RNA QUANTITATIVE
HCV Quantitative Log: <1.18 log IU/mL
HCV RNA, PCR, QN: <15 IU/mL

## 2022-06-09 LAB — HEPATITIS C ANTIBODY: Hepatitis C Ab: REACTIVE — AB

## 2022-06-15 ENCOUNTER — Telehealth: Payer: Self-pay | Admitting: Nurse Practitioner

## 2022-06-15 NOTE — Telephone Encounter (Signed)
Briant Cedar,  I was wondering does ID treat Hep C. If so I have this gentlemen that has a positive Hep C antibody but viral load is not detectable. But he states that he has never had treatment for Hep C. Looking to see if referring him to you guys would be appropriate   Thanks, Romilda Garret, DNP, AGNP-C

## 2022-06-16 NOTE — Telephone Encounter (Signed)
Spoke to infectious disease and he could have self cleared the infection. Nothing further is needed

## 2022-06-21 ENCOUNTER — Ambulatory Visit (INDEPENDENT_AMBULATORY_CARE_PROVIDER_SITE_OTHER): Payer: Commercial Managed Care - HMO | Admitting: Nurse Practitioner

## 2022-06-21 VITALS — BP 108/72 | HR 70 | Temp 97.1°F | Resp 14 | Ht 69.0 in | Wt 179.5 lb

## 2022-06-21 DIAGNOSIS — J029 Acute pharyngitis, unspecified: Secondary | ICD-10-CM | POA: Insufficient documentation

## 2022-06-21 DIAGNOSIS — B1920 Unspecified viral hepatitis C without hepatic coma: Secondary | ICD-10-CM

## 2022-06-21 DIAGNOSIS — J020 Streptococcal pharyngitis: Secondary | ICD-10-CM | POA: Insufficient documentation

## 2022-06-21 DIAGNOSIS — J0110 Acute frontal sinusitis, unspecified: Secondary | ICD-10-CM | POA: Diagnosis not present

## 2022-06-21 LAB — POCT RAPID STREP A (OFFICE): Rapid Strep A Screen: POSITIVE — AB

## 2022-06-21 MED ORDER — AMOXICILLIN-POT CLAVULANATE 875-125 MG PO TABS: 1.0000 | ORAL_TABLET | Freq: Two times a day (BID) | ORAL | 0 refills | Status: AC

## 2022-06-21 NOTE — Assessment & Plan Note (Signed)
Strep test in office. 

## 2022-06-21 NOTE — Assessment & Plan Note (Signed)
Strep test positive in office.  We will treat patient with Augmentin 875-125 mg twice daily for 7 days as I do think he has a COVID existing sinus infection 2.  Can use over-the-counter analgesics and warm salt water gargles to help with discomfort of throat.  Patient was given a note to stay out of work for 24 hours until he has been on antibiotics for that long.  Follow-up if no improvement

## 2022-06-21 NOTE — Assessment & Plan Note (Signed)
Concern with a coexisting sinus infection on top of strep pharyngitis.  We will treat patient with Augmentin 875-125 twice daily for 7 days.  Patient was given a work note to be on antibiotics 24 hours prior to returning to work

## 2022-06-21 NOTE — Assessment & Plan Note (Signed)
When tested antibody was positive but viral load was negative patient was to self cleared

## 2022-06-21 NOTE — Progress Notes (Signed)
Acute Office Visit  Subjective:     Patient ID: James Beard., male    DOB: 1986-01-17, 36 y.o.   MRN: [id number]  Chief Complaint  Patient presents with   Cough    1 week ago, sore throat, slight cough with productivity, nasal congestion, runny nose, post nasal drip, headache, nausea, sinus pain. Took Sudafed OTC    HPI Patient is in today for sick symptoms  Symtpoms started approx 1 week ago States that dad and step mom are sick but they have not been evaluated Step mom gets covid tested regularly for her employment  No covid test per patient  No covid vaccines States that he has taken pseudofed and it has helped.   Review of Systems  Constitutional:  Positive for malaise/fatigue. Negative for chills and fever.       Appetite decreased Fluid intake good   HENT:  Positive for sinus pain and sore throat. Negative for ear discharge and ear pain.   Respiratory:  Positive for cough and sputum production (milky color). Negative for shortness of breath.   Musculoskeletal:  Negative for back pain and myalgias.  Neurological:  Positive for tingling (bilateral hands) and headaches.        Objective:    BP 108/72   Pulse 70   Temp (!) 97.1 F (36.2 C)   Resp 14   Ht 5\' 9"  (1.753 m)   Wt 179 lb 8 oz (81.4 kg)   SpO2 98%   BMI 26.51 kg/m  BP Readings from Last 3 Encounters:  06/21/22 108/72  06/03/22 130/72  04/13/22 124/86   Wt Readings from Last 3 Encounters:  06/21/22 179 lb 8 oz (81.4 kg)  06/03/22 180 lb 4 oz (81.8 kg)  12/30/21 200 lb (90.7 kg)      Physical Exam Vitals and nursing note reviewed.  Constitutional:      Appearance: Normal appearance.  HENT:     Right Ear: Tympanic membrane, ear canal and external ear normal.     Left Ear: Tympanic membrane, ear canal and external ear normal.     Nose:     Right Sinus: No maxillary sinus tenderness or frontal sinus tenderness.     Left Sinus: Maxillary sinus tenderness and frontal sinus  tenderness present.     Mouth/Throat:     Mouth: Mucous membranes are moist.     Pharynx: Oropharynx is clear. Posterior oropharyngeal erythema present.  Cardiovascular:     Rate and Rhythm: Normal rate and regular rhythm.     Heart sounds: Normal heart sounds.  Pulmonary:     Effort: Pulmonary effort is normal.     Breath sounds: Normal breath sounds.  Lymphadenopathy:     Cervical: No cervical adenopathy.  Neurological:     Mental Status: He is alert.     Results for orders placed or performed in visit on 06/21/22  Rapid Strep A  Result Value Ref Range   Rapid Strep A Screen Positive (A) Negative        Assessment & Plan:   Problem List Items Addressed This Visit       Respiratory   Acute non-recurrent frontal sinusitis    Concern with a coexisting sinus infection on top of strep pharyngitis.  We will treat patient with Augmentin 875-125 twice daily for 7 days.  Patient was given a work note to be on antibiotics 24 hours prior to returning to work      Relevant Medications  amoxicillin-clavulanate (AUGMENTIN) 875-125 MG tablet   Strep pharyngitis    Strep test positive in office.  We will treat patient with Augmentin 875-125 mg twice daily for 7 days as I do think he has a COVID existing sinus infection 2.  Can use over-the-counter analgesics and warm salt water gargles to help with discomfort of throat.  Patient was given a note to stay out of work for 24 hours until he has been on antibiotics for that long.  Follow-up if no improvement      Relevant Medications   amoxicillin-clavulanate (AUGMENTIN) 875-125 MG tablet     Digestive   Hepatitis C    When tested antibody was positive but viral load was negative patient was to self cleared        Other   Sore throat - Primary    Strep test in office.      Relevant Orders   Rapid Strep A (Completed)    Meds ordered this encounter  Medications   amoxicillin-clavulanate (AUGMENTIN) 875-125 MG tablet    Sig:  Take 1 tablet by mouth 2 (two) times daily for 7 days.    Dispense:  14 tablet    Refill:  0    Order Specific Question:   Supervising Provider    Answer:   Loura Pardon A [[id number]]    Return in about 5 months (around 11/20/2022) for CPE and Labs.  Romilda Garret, NP

## 2022-06-21 NOTE — Patient Instructions (Addendum)
Rest, drink plenty of fluids.  I have sent in antibiotics to the pharmacy. Take the first dose when you get it and then the second dose tonight.  If you are fever free you can go to work after being on the antibiotics for 24 hours if you do not develop a fever  I want to see you in 5 months for your physical and lab work, sooner if you need me

## 2022-07-14 ENCOUNTER — Telehealth (INDEPENDENT_AMBULATORY_CARE_PROVIDER_SITE_OTHER): Payer: Commercial Managed Care - HMO | Admitting: Nurse Practitioner

## 2022-07-14 DIAGNOSIS — J029 Acute pharyngitis, unspecified: Secondary | ICD-10-CM | POA: Diagnosis not present

## 2022-07-14 DIAGNOSIS — Z20822 Contact with and (suspected) exposure to covid-19: Secondary | ICD-10-CM | POA: Insufficient documentation

## 2022-07-14 DIAGNOSIS — J020 Streptococcal pharyngitis: Secondary | ICD-10-CM

## 2022-07-14 LAB — POC COVID19 BINAXNOW: SARS Coronavirus 2 Ag: NEGATIVE

## 2022-07-14 LAB — POCT RAPID STREP A (OFFICE): Rapid Strep A Screen: POSITIVE — AB

## 2022-07-14 MED ORDER — AMOXICILLIN 500 MG PO CAPS: 500.0000 mg | ORAL_CAPSULE | Freq: Two times a day (BID) | ORAL | 0 refills | Status: AC

## 2022-07-14 NOTE — Progress Notes (Signed)
Patient ID: Keegen Heffern., male    DOB: 1986/04/10, 36 y.o.   MRN: [id number]  Virtual visit completed through [employer], a video enabled telemedicine application. Due to national recommendations of social distancing due to COVID-19, a virtual visit is felt to be most appropriate for this patient at this time. Reviewed limitations, risks, security and privacy concerns of performing a virtual visit and the availability of in person appointments. I also reviewed that there may be a patient responsible charge related to this service. The patient agreed to proceed.   Patient location: home Provider location: [place] at [facility], office Persons participating in this virtual visit: patient, provider   If any vitals were documented, they were collected by patient at home unless specified below.    There were no vitals taken for this visit.   CC: Covid exposure Subjective:   HPI: Avyay Coger. is a 36 y.o. male presenting on 07/14/2022 for Cough (Sx started 07/06/22- Coughing up thick phlegm, chest pain from coughing, fatigue. Lives with step mom who tested positive for Covid on 07/11/22. Not able to afford Covid test from [facility])   Symptoms started on 07/06/2022 States that his step mother tested positive on 07/11/2022 Has not taken anything over the counter No covid vaccines     Relevant past medical, surgical, family and social history reviewed and updated as indicated. Interim medical history since our last visit reviewed. Allergies and medications reviewed and updated. No outpatient medications prior to visit.   No facility-administered medications prior to visit.     Per HPI unless specifically indicated in ROS section below Review of Systems  Constitutional:  Positive for chills and fatigue. Negative for appetite change and fever.  HENT:  Positive for sore throat. Negative for ear discharge, ear pain and sinus pain.   Respiratory:  Positive for cough. Negative  for shortness of breath.        Milky color phlegm   Gastrointestinal:  Negative for abdominal pain, diarrhea and nausea.  Musculoskeletal:  Positive for arthralgias and myalgias.  Neurological:  Positive for headaches.   Objective:  There were no vitals taken for this visit.  Wt Readings from Last 3 Encounters:  06/21/22 179 lb 8 oz (81.4 kg)  06/03/22 180 lb 4 oz (81.8 kg)  12/30/21 200 lb (90.7 kg)       Physical exam: Gen: alert, NAD, not ill appearing Pulm: speaks in complete sentences without increased work of breathing Psych: normal mood, normal thought content      Results for orders placed or performed in visit on 06/21/22  Rapid Strep A  Result Value Ref Range   Rapid Strep A Screen Positive (A) Negative   Assessment & Plan:   Problem List Items Addressed This Visit       Respiratory   Strep pharyngitis    Strep test positive.  Treat patient with amoxicillin 500 mg twice daily for 10 days.  Follow-up if no improvement      Relevant Medications   amoxicillin (AMOXIL) 500 MG capsule     Other   Sore throat    Pending COVID and strep test.      Relevant Orders   Rapid Strep A   Exposure to COVID-19 virus - Primary    Pending COVID test.      Relevant Orders   POC COVID-19     Meds ordered this encounter  Medications   amoxicillin (AMOXIL) 500 MG capsule  Sig: Take 1 capsule (500 mg total) by mouth 2 (two) times daily for 10 days.    Dispense:  20 capsule    Refill:  0    Order Specific Question:   Supervising Provider    Answer:   Roxy Manns A [[id number]]   Orders Placed This Encounter  Procedures   Rapid Strep A    Standing Status:   Future    Standing Expiration Date:   08/13/2022   POC COVID-19    Standing Status:   Future    Standing Expiration Date:   08/13/2022    Order Specific Question:   Previously tested for COVID-19    Answer:   Yes    Order Specific Question:   Resident in a congregate (group) care setting    Answer:   No     Order Specific Question:   Employed in healthcare setting    Answer:   No    I discussed the assessment and treatment plan with the patient. The patient was provided an opportunity to ask questions and all were answered. The patient agreed with the plan and demonstrated an understanding of the instructions. The patient was advised to call back or seek an in-person evaluation if the symptoms worsen or if the condition fails to improve as anticipated.  Follow up plan: No follow-ups on file.  Audria Nine, NP

## 2022-07-14 NOTE — Assessment & Plan Note (Signed)
Strep test positive.  Treat patient with amoxicillin 500 mg twice daily for 10 days.  Follow-up if no improvement

## 2022-07-14 NOTE — Assessment & Plan Note (Signed)
Pending COVID and strep test.

## 2022-07-14 NOTE — Assessment & Plan Note (Signed)
Pending COVID test.

## 2022-07-18 ENCOUNTER — Other Ambulatory Visit: Payer: Self-pay

## 2022-07-18 ENCOUNTER — Emergency Department
Admission: EM | Admit: 2022-07-18 | Discharge: 2022-07-18 | Disposition: A | Discharge status: Home / Self Care | Payer: Commercial Managed Care - HMO | Attending: Emergency Medicine | Admitting: Emergency Medicine

## 2022-07-18 ENCOUNTER — Encounter: Payer: Self-pay | Admitting: Emergency Medicine

## 2022-07-18 DIAGNOSIS — L03311 Cellulitis of abdominal wall: Secondary | ICD-10-CM | POA: Insufficient documentation

## 2022-07-18 DIAGNOSIS — L0291 Cutaneous abscess, unspecified: Secondary | ICD-10-CM

## 2022-07-18 LAB — BASIC METABOLIC PANEL
Anion gap: 8 (ref 5–15)
BUN: 11 mg/dL (ref 6–20)
CO2: 25 mmol/L (ref 22–32)
Calcium: 9.1 mg/dL (ref 8.9–10.3)
Chloride: 103 mmol/L (ref 98–111)
Creatinine, Ser: 0.74 mg/dL (ref 0.61–1.24)
GFR, Estimated: >60 mL/min (ref >60)
Glucose, Bld: 104 mg/dL — ABNORMAL HIGH (ref 70–99)
Potassium: 3.7 mmol/L (ref 3.5–5.1)
Sodium: 136 mmol/L (ref 135–145)

## 2022-07-18 LAB — CBC
HCT: 38.4 % — ABNORMAL LOW (ref 39.0–52.0)
Hemoglobin: 12.9 g/dL — ABNORMAL LOW (ref 13.0–17.0)
MCH: 29.3 pg (ref 26.0–34.0)
MCHC: 33.6 g/dL (ref 30.0–36.0)
MCV: 87.1 fL (ref 80.0–100.0)
Platelets: 276 10*3/uL (ref 150–400)
RBC: 4.41 MIL/uL (ref 4.22–5.81)
RDW: 12.9 % (ref 11.5–15.5)
WBC: 7 10*3/uL (ref 4.0–10.5)
nRBC: 0 % (ref 0.0–0.2)

## 2022-07-18 MED ORDER — DOXYCYCLINE MONOHYDRATE 100 MG PO TABS: 100.0000 mg | ORAL_TABLET | Freq: Two times a day (BID) | ORAL | 0 refills | Status: AC

## 2022-07-18 MED ORDER — CEPHALEXIN 500 MG PO CAPS: 500.0000 mg | ORAL_CAPSULE | Freq: Four times a day (QID) | ORAL | 0 refills | Status: AC

## 2022-07-18 MED ORDER — LIDOCAINE HCL 1 % IJ SOLN
5.0000 mL | Freq: Once | INTRAMUSCULAR | Status: AC
  Administered 2022-07-18: 5 mL
  Filled 2022-07-18: qty 10

## 2022-07-18 MED ORDER — KETOROLAC TROMETHAMINE 30 MG/ML IJ SOLN
30.0000 mg | Freq: Once | INTRAMUSCULAR | Status: AC
  Administered 2022-07-18: 30 mg via INTRAMUSCULAR
  Filled 2022-07-18: qty 1

## 2022-07-18 MED ORDER — DOXYCYCLINE HYCLATE 100 MG PO TABS
100.0000 mg | ORAL_TABLET | Freq: Once | ORAL | Status: AC
  Administered 2022-07-18: 100 mg via ORAL
  Filled 2022-07-18: qty 1

## 2022-07-18 MED ORDER — CEFTRIAXONE SODIUM 1 G IJ SOLR
1.0000 g | Freq: Once | INTRAMUSCULAR | Status: AC
  Administered 2022-07-18: 1 g via INTRAMUSCULAR
  Filled 2022-07-18: qty 10

## 2022-07-18 NOTE — ED Triage Notes (Signed)
Pt sts that he has a wound on his left side. Wound is red and warm to the touch with pain. X4 days

## 2022-07-18 NOTE — ED Provider Triage Note (Signed)
Emergency Medicine Provider Triage Evaluation Note  James Beard. , a 36 y.o. male  was evaluated in triage.  Pt complains of large red area on the left flank, past history of IV drug use but has not used in over 3 years, does have history of MRSA.  No fever.  States area is painful..  Review of Systems  Positive:  Negative:   Physical Exam  BP 134/83   Pulse 60   Temp 98.3 F (36.8 C) (Oral)   Resp 18   Wt 76.8 kg   BMI 25.00 kg/m  Gen:   Awake, no distress   Resp:  Normal effort  MSK:   Moves extremities without difficulty  Other:  Skin with very large area of redness on the left flank area  Medical Decision Making  Medically screening exam initiated at 1:21 PM.  Appropriate orders placed.  James Beard. was informed that the remainder of the evaluation will be completed by another provider, this initial triage assessment does not replace that evaluation, and the importance of remaining in the ED until their evaluation is complete.     Faythe Ghee, PA-C 07/18/22 1322

## 2022-07-18 NOTE — ED Notes (Signed)
See triage note  Presents with possible abscess area to left lateral side area

## 2022-07-18 NOTE — Discharge Instructions (Signed)
You have been diagnosed with an infected sebaceous cyst. You received a procedure in the emergency department, incision and drainage. This wound was packed and the packing needs to be removed in 3 days. You have a skin infection and will require antibiotics.  Please take doxycycline and Keflex as directed. Please return to the emergency department if redness and pain in this area worsens.

## 2022-07-18 NOTE — ED Provider Notes (Signed)
[facility] Provider Note  Patient Contact: 3:21 PM (approximate)   History   Abscess   HPI  James Beard. is a 36 y.o. male with a history of IV drug abuse, MRSA, hepatitis C, hepatitis B and prior abscess, presents to the emergency department with cellulitis of the left flank with concern for abscess.  Patient reports he has had some chills at home but is uncertain of fever.  No nausea or vomiting.  Denies recent admission.  No chest pain, chest tightness or shortness of breath.      Physical Exam   Triage Vital Signs: ED Triage Vitals  Enc Vitals Group     BP 07/18/22 1318 134/83     Pulse Rate 07/18/22 1318 60     Resp 07/18/22 1318 18     Temp 07/18/22 1318 98.3 F (36.8 C)     Temp Source 07/18/22 1318 Oral     SpO2 07/18/22 1452 98 %     Weight 07/18/22 1319 169 lb 5 oz (76.8 kg)     Height 07/18/22 1450 5\' 9"  (1.753 m)     Head Circumference --      Peak Flow --      Pain Score 07/18/22 1318 7     Pain Loc --      Pain Edu? --      Excl. in GC? --     Most recent vital signs: Vitals:   07/18/22 1318 07/18/22 1452  BP: 134/83 130/80  Pulse: 60 68  Resp: 18 18  Temp: 98.3 F (36.8 C)   SpO2:  98%     General: Alert and in no acute distress. Eyes:  PERRL. EOMI. Head: No acute traumatic findings ENT:      Nose: No congestion/rhinnorhea.      Mouth/Throat: Mucous membranes are moist. Neck: No stridor. No cervical spine tenderness to palpation. Cardiovascular:  Good peripheral perfusion Respiratory: Normal respiratory effort without tachypnea or retractions. Lungs CTAB. Good air entry to the bases with no decreased or absent breath sounds. Gastrointestinal: Bowel sounds 4 quadrants. Soft and nontender to palpation. No guarding or rigidity. No palpable masses. No distention. No CVA tenderness. Musculoskeletal: Full range of motion to all extremities.  Neurologic:  No gross focal neurologic deficits are appreciated.   Skin: Patient has an 8 cm x 8 cm region of cellulitis with central induration. Other:   ED Results / Procedures / Treatments   Labs (all labs ordered are listed, but only abnormal results are displayed) Labs Reviewed  CBC - Abnormal; Notable for the following components:      Result Value   Hemoglobin 12.9 (*)    HCT 38.4 (*)    All other components within normal limits  BASIC METABOLIC PANEL - Abnormal; Notable for the following components:   Glucose, Bld 104 (*)    All other components within normal limits  AEROBIC/ANAEROBIC CULTURE W GRAM STAIN (SURGICAL/DEEP WOUND)           PROCEDURES:  Critical Care performed: No  ..Incision and Drainage  Date/Time: 07/18/2022 3:24 PM  Performed by: 07/20/2022, PA-C Authorized by: Orvil Feil, PA-C   Consent:    Consent obtained:  Verbal   Consent given by:  Patient   Risks discussed:  Bleeding, infection, incomplete drainage and pain   Alternatives discussed:  Alternative treatment, delayed treatment and observation Universal protocol:    Procedure explained and questions answered to patient or proxy's satisfaction:  yes     Patient identity confirmed:  Verbally with patient Location:    Type:  Abscess Anesthesia:    Anesthesia method:  Local infiltration   Local anesthetic:  Lidocaine 1% w/o epi Procedure type:    Complexity:  Complex Procedure details:    Ultrasound guidance: no     Needle aspiration: no     Incision types:  Stab incision   Incision depth:  Dermal   Wound management:  Probed and deloculated   Drainage:  Purulent   Drainage amount:  Moderate   Packing materials:  1/4 in iodoform gauze Post-procedure details:    Procedure completion:  Tolerated well, no immediate complications    MEDICATIONS ORDERED IN ED: Medications  lidocaine (XYLOCAINE) 1 % (with pres) injection 5 mL (has no administration in time range)  cefTRIAXone (ROCEPHIN) injection 1 g (has no administration in time  range)     IMPRESSION / MDM / ASSESSMENT AND PLAN / ED COURSE  I reviewed the triage vital signs and the nursing notes.                              Assessment and plan: Abscess:  36 year old male presents to the emergency department with an 8 cm x 8 cm region of cellulitis along left flank with central induration concerning for abscess.  Vital signs are reassuring at triage.  On exam, patient was alert and nontoxic-appearing.  He underwent incision and drainage without complication and wound cultures in process.  Patient was given IM Rocephin in the emergency department and his first dose of doxycycline.  He was discharged with doxycycline and Keflex which will cover him for both MSSA and MRSA.  Patient was cautioned that should his symptoms worsen at home to return to the emergency department for reevaluation.     FINAL CLINICAL IMPRESSION(S) / ED DIAGNOSES   Final diagnoses:  None     Rx / DC Orders   ED Discharge Orders     None        Note:  This document was prepared using Dragon voice recognition software and may include unintentional dictation errors.   Pia Mau [place], PA-C 07/18/22 1551    Concha Se, MD 07/18/22 [phone]

## 2022-07-23 LAB — AEROBIC/ANAEROBIC CULTURE W GRAM STAIN (SURGICAL/DEEP WOUND): Special Requests: NORMAL

## 2022-08-03 ENCOUNTER — Ambulatory Visit: Payer: Commercial Managed Care - HMO | Admitting: Student in an Organized Health Care Education/Training Program

## 2022-08-11 ENCOUNTER — Other Ambulatory Visit: Payer: Commercial Managed Care - HMO

## 2022-08-11 ENCOUNTER — Emergency Department
Admission: EM | Admit: 2022-08-11 | Discharge: 2022-08-11 | Disposition: A | Discharge status: Home / Self Care | Payer: Commercial Managed Care - HMO | Attending: Emergency Medicine | Admitting: Emergency Medicine

## 2022-08-11 ENCOUNTER — Other Ambulatory Visit: Payer: Self-pay

## 2022-08-11 ENCOUNTER — Emergency Department: Payer: Commercial Managed Care - HMO

## 2022-08-11 DIAGNOSIS — J40 Bronchitis, not specified as acute or chronic: Secondary | ICD-10-CM | POA: Diagnosis not present

## 2022-08-11 DIAGNOSIS — Z20822 Contact with and (suspected) exposure to covid-19: Secondary | ICD-10-CM | POA: Diagnosis not present

## 2022-08-11 DIAGNOSIS — R059 Cough, unspecified: Secondary | ICD-10-CM | POA: Diagnosis present

## 2022-08-11 LAB — RESP PANEL BY RT-PCR (RSV, FLU A&B, COVID)  RVPGX2
Influenza A by PCR: NEGATIVE
Influenza B by PCR: NEGATIVE
Resp Syncytial Virus by PCR: NEGATIVE
SARS Coronavirus 2 by RT PCR: NEGATIVE

## 2022-08-11 LAB — GROUP A STREP BY PCR: Group A Strep by PCR: NOT DETECTED

## 2022-08-11 MED ORDER — SPACER/AERO-HOLD CHAMBER BAGS MISC: 0 refills | Status: AC

## 2022-08-11 MED ORDER — IPRATROPIUM-ALBUTEROL 0.5-2.5 (3) MG/3ML IN SOLN
3.0000 mL | Freq: Once | RESPIRATORY_TRACT | Status: AC
  Administered 2022-08-11: 3 mL via RESPIRATORY_TRACT
  Filled 2022-08-11: qty 3

## 2022-08-11 MED ORDER — ALBUTEROL SULFATE HFA 108 (90 BASE) MCG/ACT IN AERS: 2.0000 | INHALATION_SPRAY | Freq: Four times a day (QID) | RESPIRATORY_TRACT | 2 refills | Status: AC | PRN

## 2022-08-11 MED ORDER — AZITHROMYCIN 250 MG PO TABS: ORAL_TABLET | ORAL | 0 refills | Status: AC

## 2022-08-11 NOTE — ED Provider Notes (Signed)
[facility] Provider Note    Event Date/Time   First MD Initiated Contact with Patient 08/11/22 1924     (approximate)   History   URI   HPI  James Beard. is a 36 y.o. male who reports to strep throat twice this year so far.  He is having cough congestion and sore throat going on now.  There is a little bit of a raspy voice.    Past medical history reportedly from the old records hepatitis C active infection osteoarthritis history of migraines No known allergies Physical Exam   Triage Vital Signs: ED Triage Vitals [08/11/22 1903]  Enc Vitals Group     BP      Pulse      Resp      Temp      Temp src      SpO2      Weight      Height      Head Circumference      Peak Flow      Pain Score 5     Pain Loc      Pain Edu?      Excl. in GC?     Most recent vital signs: There were no vitals filed for this visit.   General: Awake, no distress.  Mouth: No erythema or exudate CV:  Good peripheral perfusion.  Heart regular rate and rhythm no audible murmurs Resp:  Normal effort.  Lungs with occasional scattered wheezes and rhonchi Abd:  No distention.  Soft and nontender    ED Results / Procedures / Treatments   Labs (all labs ordered are listed, but only abnormal results are displayed) Labs Reviewed  GROUP A STREP BY PCR  RESP PANEL BY RT-PCR (RSV, FLU A&B, COVID)  [id number]     EKG     RADIOLOGY X-ray read by radiology reviewed interpreted by me as negative       PROCEDURES:  Critical Care performed:   Procedures   MEDICATIONS ORDERED IN ED: Medications  ipratropium-albuterol (DUONEB) 0.5-2.5 (3) MG/3ML nebulizer solution 3 mL (3 mLs Nebulization Given 08/11/22 2119)     IMPRESSION / MDM / ASSESSMENT AND PLAN / ED COURSE  I reviewed the triage vital signs and the nursing notes. Chest x-ray is negative strep, flu, RSV and COVID are all negative but the patient does have wet productive cough with some wheezing  and crackles.  It sounds like a bronchitis now we will treat him as such with albuterol spacer and some Zithromax.  Differential diagnosis includes, but is not limited to, there is no sign of pneumonia.  Clinically he does not have a pulmonary embolus.  He does not have the flu or COVID.  He does not have strep.  Patient's presentation is most consistent with acute complicated illness / injury requiring diagnostic workup.     FINAL CLINICAL IMPRESSION(S) / ED DIAGNOSES   Final diagnoses:  Bronchitis     Rx / DC Orders   ED Discharge Orders          Ordered    albuterol (VENTOLIN HFA) 108 (90 Base) MCG/ACT inhaler  Every 6 hours PRN        08/11/22 2243    Spacer/Aero-Hold Chamber Bags MISC        08/11/22 2243    azithromycin (ZITHROMAX Z-PAK) 250 MG tablet        08/11/22 2243  Note:  This document was prepared using Dragon voice recognition software and may include unintentional dictation errors.   Nena Polio, MD 08/11/22 2245

## 2022-08-11 NOTE — ED Triage Notes (Signed)
Pt to ED via POV c/o cough, congestion, and sore throat. Pt is in NAD.

## 2022-08-11 NOTE — Discharge Instructions (Addendum)
Your RSV and COVID and flu test were negative.  Your strep test was also negative.  Your chest x-ray looked good.  It looks like you just have a bronchitis.  I will give you an albuterol inhaler.  Use that with the spacer 2 puffs 4 times a day that should help.  I will give you some antibiotics.  I will give you a Zithromax Z-PAK.  Take that as directed.  Please return for shortness of breath or high fever or feeling sicker.

## 2022-08-11 NOTE — ED Notes (Signed)
E-signature pad unavailable - Pt verbalized understanding of D/C information - no additional concerns at this time.  

## 2022-08-15 ENCOUNTER — Ambulatory Visit: Payer: Commercial Managed Care - HMO | Admitting: Nurse Practitioner

## 2022-11-09 ENCOUNTER — Emergency Department: Payer: Commercial Managed Care - HMO

## 2022-11-09 ENCOUNTER — Other Ambulatory Visit: Payer: Self-pay

## 2022-11-09 ENCOUNTER — Emergency Department
Admission: EM | Admit: 2022-11-09 | Discharge: 2022-11-09 | Disposition: A | Discharge status: Home / Self Care | Payer: Commercial Managed Care - HMO | Attending: Emergency Medicine | Admitting: Emergency Medicine

## 2022-11-09 ENCOUNTER — Encounter: Payer: Self-pay | Admitting: Emergency Medicine

## 2022-11-09 DIAGNOSIS — M19032 Primary osteoarthritis, left wrist: Secondary | ICD-10-CM | POA: Diagnosis not present

## 2022-11-09 DIAGNOSIS — M67432 Ganglion, left wrist: Secondary | ICD-10-CM | POA: Insufficient documentation

## 2022-11-09 DIAGNOSIS — M25532 Pain in left wrist: Secondary | ICD-10-CM | POA: Diagnosis present

## 2022-11-09 NOTE — Discharge Instructions (Signed)
Take Tylenol and ibuprofen for pain.  Elevate and ice.  Wear the splint for at least 7 days.  After that you may wear as needed.  Follow-up with orthopedics as they may want to address the ganglion cyst and the arthritis in your wrist.  Return if worsening

## 2022-11-09 NOTE — ED Notes (Signed)
Manuela Schwartz PA at bedside; pt ambulatory to recliner; pt holding rag around L arm and holding R arm against upper abdomen; resp reg/unlabored; skin dry; calm; L hand warm and appropriate in color.

## 2022-11-09 NOTE — ED Provider Notes (Signed)
[facility] Provider Note    Event Date/Time   First MD Initiated Contact with Patient 11/09/22 1247     (approximate)   History   Arm Pain   HPI  James Beard. is a 37 y.o. male presents emergency department with left wrist pain.  No known injury.  Patient states hurts when he twisted it and bends it forward.  No numbness or tingling.  No fever or chills.  No history of IV drug use      Physical Exam   Triage Vital Signs: ED Triage Vitals  Enc Vitals Group     BP 11/09/22 1217 (!) 148/48     Pulse Rate 11/09/22 1217 77     Resp 11/09/22 1217 18     Temp 11/09/22 1216 98.1 F (36.7 C)     Temp Source 11/09/22 1216 Oral     SpO2 11/09/22 1217 96 %     Weight --      Height --      Head Circumference --      Peak Flow --      Pain Score 11/09/22 1215 10     Pain Loc --      Pain Edu? --      Excl. in [place]? --     Most recent vital signs: Vitals:   11/09/22 1216 11/09/22 1217  BP:  (!) 148/48  Pulse:  77  Resp:  18  Temp: 98.1 F (36.7 C)   SpO2:  96%     General: Awake, no distress.   CV:  Good peripheral perfusion. regular rate and  rhythm Resp:  Normal effort.  Abd:  No distention.   Other:  Left wrist with dorsal ganglion cyst noted, abrasion noted to the knuckle on the seconds metacarpal, no pus or drainage noted, neurovascular appears to be intact, tenderness at the joint space   ED Results / Procedures / Treatments   Labs (all labs ordered are listed, but only abnormal results are displayed) Labs Reviewed - No data to display   EKG     RADIOLOGY X-ray of the left wrist    PROCEDURES:   Procedures   MEDICATIONS ORDERED IN ED: Medications - No data to display   IMPRESSION / MDM / [place] / ED COURSE  I reviewed the triage vital signs and the nursing notes.                              Differential diagnosis includes, but is not limited to, ganglion cyst, tenosynovitis, fracture,  osteoarthritis, osteomyelitis  Patient's presentation is most consistent with acute complicated illness / injury requiring diagnostic workup.   X-ray of the left wrist was independently reviewed and interpreted by me as being negative for fracture.  Radiologist is read it as triscaphe arthritis.  I did explain these findings to the patient.  He is placed in a wrist brace.  He is to take Tylenol and ibuprofen for pain as needed.  Follow-up with orthopedics.  He is in agreement treatment plan.  Was discharged in stable condition.  I did explain to him I think most of his pain at this time is coming from the ganglionic cyst and not the arthritis.      FINAL CLINICAL IMPRESSION(S) / ED DIAGNOSES   Final diagnoses:  Ganglion cyst of dorsum of left wrist  Arthritis of left wrist  Rx / DC Orders   ED Discharge Orders     None        Note:  This document was prepared using Dragon voice recognition software and may include unintentional dictation errors.    Versie Starks, PA-C 11/09/22 1300    Rada Hay, MD 11/09/22 [phone]

## 2022-11-09 NOTE — ED Triage Notes (Signed)
Patient to ED for left arm pain x3 days. Denies any injury. States "the tendons are coming apart." Pain radiates into elbow.

## 2022-11-09 NOTE — ED Notes (Signed)
No topaz available so pt signed printed d/c paperwork.

## 2022-11-10 ENCOUNTER — Telehealth: Payer: Self-pay

## 2022-11-10 NOTE — Transitions of Care (Post Inpatient/ED Visit) (Signed)
   11/10/2022  Name: James Beard. MRN: [id number] DOB: 16-Sep-1985  Today's TOC FU Call Status: Today's TOC FU Call Status:: Unsuccessul Call (1st Attempt) Unsuccessful Call (1st Attempt) Date: 11/10/22  Attempted to reach the patient regarding the most recent Inpatient/ED visit.  Follow Up Plan: Additional outreach attempts will be made to reach the patient to complete the Transitions of Care (Post Inpatient/ED visit) call.   [place] LPN [place] Advisor Direct Dial [phone]

## 2022-11-14 NOTE — Transitions of Care (Post Inpatient/ED Visit) (Signed)
   11/14/2022  Name: James Beard. MRN: [id number] DOB: 14-Mar-1986  Today's TOC FU Call Status: Today's TOC FU Call Status:: Unsuccessful Call (2nd Attempt) Unsuccessful Call (1st Attempt) Date: 11/10/22 Unsuccessful Call (2nd Attempt) Date: 11/14/22  Attempted to reach the patient regarding the most recent Inpatient/ED visit.  Follow Up Plan: No further outreach attempts will be made at this time. We have been unable to contact the patient.  [place] LPN [place] Advisor Direct Dial [phone]

## 2022-11-21 ENCOUNTER — Encounter: Payer: Self-pay | Admitting: Nurse Practitioner

## 2022-11-21 ENCOUNTER — Encounter: Payer: Commercial Managed Care - HMO | Admitting: Nurse Practitioner

## 2022-12-06 ENCOUNTER — Encounter: Payer: Self-pay | Admitting: Emergency Medicine

## 2022-12-06 ENCOUNTER — Other Ambulatory Visit: Payer: Self-pay

## 2022-12-06 ENCOUNTER — Emergency Department
Admission: EM | Admit: 2022-12-06 | Discharge: 2022-12-06 | Disposition: A | Discharge status: Home / Self Care | Payer: Commercial Managed Care - HMO | Attending: Emergency Medicine | Admitting: Emergency Medicine

## 2022-12-06 DIAGNOSIS — K047 Periapical abscess without sinus: Secondary | ICD-10-CM | POA: Diagnosis not present

## 2022-12-06 DIAGNOSIS — R220 Localized swelling, mass and lump, head: Secondary | ICD-10-CM | POA: Diagnosis present

## 2022-12-06 MED ORDER — AMOXICILLIN-POT CLAVULANATE 875-125 MG PO TABS: 1.0000 | ORAL_TABLET | Freq: Two times a day (BID) | ORAL | 0 refills | Status: AC

## 2022-12-06 NOTE — ED Provider Notes (Signed)
   [facility] Provider Note    Event Date/Time   First MD Initiated Contact with Patient 12/06/22 1633     (approximate)   History   Dental Pain and Facial Swelling   HPI  James Beard. is a 37 y.o. male who presents with complaints of dental pain at approximately the 14th tooth.  He reports he started on amoxicillin yesterday but he has developed some swelling along his left sinus.  No fevers or chills.  No intraoral swelling.  No difficulty swallowing.     Physical Exam   Triage Vital Signs: ED Triage Vitals  Enc Vitals Group     BP 12/06/22 1557 (!) 126/93     Pulse Rate 12/06/22 1557 69     Resp 12/06/22 1557 20     Temp 12/06/22 1557 98.7 F (37.1 C)     Temp src --      SpO2 12/06/22 1557 97 %     Weight 12/06/22 1555 72.6 kg (160 lb)     Height 12/06/22 1555 1.753 m (5\' 9" )     Head Circumference --      Peak Flow --      Pain Score 12/06/22 1555 7     Pain Loc --      Pain Edu? --      Excl. in [place]? --     Most recent vital signs: Vitals:   12/06/22 1557  BP: (!) 126/93  Pulse: 69  Resp: 20  Temp: 98.7 F (37.1 C)  SpO2: 97%     General: Awake, no distress.  CV:  Good peripheral perfusion.  Resp:  Normal effort.  Abd:  No distention.  Other:     ED Results / Procedures / Treatments   Labs (all labs ordered are listed, but only abnormal results are displayed) Labs Reviewed - No data to display   EKG     RADIOLOGY     PROCEDURES:  Critical Care performed:   Procedures   MEDICATIONS ORDERED IN ED: Medications - No data to display   IMPRESSION / MDM / [place] / ED COURSE  I reviewed the triage vital signs and the nursing notes. Patient's presentation is most consistent with acute, uncomplicated illness.  Patient presents with dental pain, facial swelling as detailed above.  Mild swelling to the left sinus area.  No intraoral abscess noted.  Very poor dentition, exam consistent  with dental infection, will transition the patient to Augmentin, outpatient follow-up with dentist recommended.  No indication for imaging or other workup at this time.       FINAL CLINICAL IMPRESSION(S) / ED DIAGNOSES   Final diagnoses:  Dental infection     Rx / DC Orders   ED Discharge Orders          Ordered    amoxicillin-clavulanate (AUGMENTIN) 875-125 MG tablet  2 times daily        12/06/22 1639             Note:  This document was prepared using Dragon voice recognition software and may include unintentional dictation errors.   [place] Drafts, MD 12/06/22 [phone]

## 2022-12-06 NOTE — ED Triage Notes (Signed)
Pt via POV from home. States that he had a dental abscess on the L side, started his abx yesterday but states he woke up this AM with L sided facial swelling. Notable facial swelling noted to the L side of patient's face. Pt is A&Ox4 and NAD

## 2022-12-07 ENCOUNTER — Telehealth: Payer: Self-pay

## 2022-12-07 NOTE — Transitions of Care (Post Inpatient/ED Visit) (Signed)
   12/07/2022  Name: James Beard. MRN: [id number] DOB: November 03, 1985  Today's TOC FU Call Status: Today's TOC FU Call Status:: Unsuccessul Call (1st Attempt) Unsuccessful Call (1st Attempt) Date: 12/07/22  Attempted to reach the patient regarding the most recent Inpatient/ED visit.  Follow Up Plan: Additional outreach attempts will be made to reach the patient to complete the Transitions of Care (Post Inpatient/ED visit) call.   Signature  G[place]na Durand LPN [place] Team Direct dial:  [phone]

## 2023-09-22 ENCOUNTER — Other Ambulatory Visit: Payer: Self-pay

## 2023-09-22 ENCOUNTER — Emergency Department
Admission: EM | Admit: 2023-09-22 | Discharge: 2023-09-22 | Disposition: A | Discharge status: Home / Self Care | Payer: Commercial Managed Care - HMO | Attending: Emergency Medicine | Admitting: Emergency Medicine

## 2023-09-22 ENCOUNTER — Emergency Department: Payer: Commercial Managed Care - HMO

## 2023-09-22 DIAGNOSIS — W1840XA Slipping, tripping and stumbling without falling, unspecified, initial encounter: Secondary | ICD-10-CM | POA: Insufficient documentation

## 2023-09-22 DIAGNOSIS — M25561 Pain in right knee: Secondary | ICD-10-CM | POA: Insufficient documentation

## 2023-09-22 MED ORDER — LIDOCAINE HCL (PF) 1 % IJ SOLN
5.0000 mL | Freq: Once | INTRAMUSCULAR | Status: DC
  Filled 2023-09-22: qty 5

## 2023-09-22 MED ORDER — IBUPROFEN 800 MG PO TABS
800.0000 mg | ORAL_TABLET | Freq: Once | ORAL | Status: AC
  Administered 2023-09-22: 800 mg via ORAL
  Filled 2023-09-22: qty 2

## 2023-09-22 MED ORDER — ACETAMINOPHEN 325 MG PO TABS: 650.0000 mg | ORAL_TABLET | Freq: Once | ORAL | Status: DC

## 2023-09-22 MED ORDER — NICOTINE 7 MG/24HR TD PT24
7.0000 mg | MEDICATED_PATCH | Freq: Once | TRANSDERMAL | Status: DC
  Filled 2023-09-22: qty 1

## 2023-09-22 NOTE — ED Provider Notes (Signed)
[facility] Provider Note    Event Date/Time   First MD Initiated Contact with Patient 09/22/23 Ernestina [place]     (approximate)   History   Knee Pain   HPI  James Beard. is a 38 y.o. male with PMH of IV drug abuse, bipolar 1 disorder and septic arthritis of the knee presents for evaluation of knee pain.  Patient states that his pain began while he was at work today.  He did not fall on it but describes slipping on a steep hill.  Feels like his knee is very unsteady and if he puts any weight on it it will give out.      Physical Exam   Triage Vital Signs: ED Triage Vitals  Encounter Vitals Group     BP 09/22/23 1752 138/83     Systolic BP Percentile --      Diastolic BP Percentile --      Pulse Rate 09/22/23 1752 86     Resp 09/22/23 1752 18     Temp 09/22/23 1752 98 F (36.7 C)     Temp src --      SpO2 09/22/23 1752 98 %     Weight 09/22/23 1756 150 lb (68 kg)     Height 09/22/23 1756 5\' 6"  (1.676 m)     Head Circumference --      Peak Flow --      Pain Score 09/22/23 1756 10     Pain Loc --      Pain Education --      Exclude from Growth Chart --     Most recent vital signs: Vitals:   09/22/23 2116 09/22/23 2223  BP: 109/62   Pulse: (!) 54   Resp: 16 17  Temp: 98.8 F (37.1 C)   SpO2: 97%    General: Awake, no distress.  CV:  Good peripheral perfusion.  Resp:  Normal effort.  Abd:  No distention.  Other:  Right knee not significantly swollen when compared to the left.  Very tender to palpation throughout the knee joint.  No overlying skin changes or appreciable erythema however it is warm when compared to the left side.  Range of motion limited due to pain.  Unable to assess for ligamentous or meniscal injury as patient could not tolerate the maneuvers due to his pain.   ED Results / Procedures / Treatments   Labs (all labs ordered are listed, but only abnormal results are displayed) Labs Reviewed - No data to  display  RADIOLOGY  Right knee x-ray obtained, I interpreted the images as well as reviewed the radiologist report which was negative for fracture but did show a small knee joint effusion.   PROCEDURES:  Critical Care performed: No  Procedures   MEDICATIONS ORDERED IN ED: Medications  nicotine (NICODERM CQ - dosed in mg/24 hr) patch 7 mg (7 mg Transdermal Not Given 09/22/23 2222)  lidocaine (PF) (XYLOCAINE) 1 % injection 5 mL (5 mLs Intradermal Not Given 09/22/23 2222)  ibuprofen (ADVIL) tablet 800 mg (800 mg Oral Given 09/22/23 2041)     IMPRESSION / MDM / ASSESSMENT AND PLAN / ED COURSE  I reviewed the triage vital signs and the nursing notes.                             38 year old male presents for evaluation of right knee pain that began today.  Patient is a  little bradycardic but otherwise he is afebrile and vital signs are stable.  Differential diagnosis includes, but is not limited to, contusion, knee sprain, meniscus injury, ligament injury, septic joint.  Patient's presentation is most consistent with acute complicated illness / injury requiring diagnostic workup.  Right knee x-ray showed mild joint effusion.  Patient denies any specific trauma to the area.  He is not febrile and knee is not significantly swollen or erythematous when compared with the left side however it is warm to touch and patient does have a history of septic joint.  I do feel it is important to rule this out with a joint aspiration.  He also describes having instability which makes me suspicious for possible meniscus or ligament injury.  Patient was initially agreeable to joint aspiration but then expressed concerns about not having a ride home. Patient stated that he would return to the ED tomorrow to have the aspiration done.  I explained that he would be leaving AMA, we discussed the risks of this, he is aware that he can return at any time to receive care.  09/22/2023 at 10:13 PM:  The patient  requested to leave.  I considered this to be leaving against medical advice. I personally discussed the following with them:   That they currently had a medical condition of right knee pain and I am concerned that they may have septic joint.     My proposed course of evaluation and treatment includes, but is not limited to,  joint aspiration.  Benefits of staying include possible diagnosis or excluding of septic joint which if identified early would lead to appropriate intervention in a timely manner lessening the burden of disability and death.  Risks of leaving before this had been completed include: misdiagnosis, worsening illness leading up to and including prolonged or permanent disability or death.  Specific risks pertinent, but not all inclusive, of their current medical condition include but are not limited to worsening knee pain or spread of infection.   Despite this they stated they wanted to leave due to not having a ride home if they were to stay longer and refused further evaluation, treatment, or admission at this time.   They appeared clinically sober, were mentating appropriately, were free from distracting injury, had adequately controlled acute pain, appeared to have intact insight, judgment, and reason, and in my opinion had the capacity to make this decision.  Specifically, they were able to verbally state back in a coherent manner their current medical condition/current diagnosis, the proposed course of evaluation and/or treatment, and the risks, benefits, and alternatives of treatment versus leaving against medical advice.   They understand that they may return to seek medical attention here at ANY time they want.  I strongly advised them to return to the Emergency Department immediately if they experience any new or worsening symptoms that concern them, or simply if they reconsider continued evaluation and/or treatment as previously discussed.  This would be without any  repercussions, though they understand they likely will need to wait again in the Emergency Department if other patients are in front of them, rather than being brought straight back.  They understood this is another advantage of staying, but still insisted upon leaving.  I recommended they follow-up with the emergency department or orthopedics at the earliest available opportunity/appointment for further evaluation and treatment.   The patient was discharged against medical advice.  They did accept written discharge instructions.      FINAL CLINICAL IMPRESSION(S) /  ED DIAGNOSES   Final diagnoses:  Acute pain of right knee     Rx / DC Orders   ED Discharge Orders     None        Note:  This document was prepared using Dragon voice recognition software and may include unintentional dictation errors.   Zachary Ali, PA-C 09/22/23 2308    Corena Herter, MD 09/22/23 2340

## 2023-09-22 NOTE — ED Notes (Signed)
Pt assisted to toilet 

## 2023-09-22 NOTE — Discharge Instructions (Signed)
Please return to the ED tomorrow to have a knee arthrocentesis. This will help [place] identify the cause of your knee pain and swelling. Over night you can take ibuprofen for your pain.

## 2023-09-22 NOTE — ED Provider Notes (Signed)
Shared visit  Slipped and has right knee pain and swelling.  History of septic joint.  No significant trauma to the knee.  X-ray negative for fracture.  Does have some pain with range of motion with no overlying erythema warmth or induration.  Possible septic joint given his history.  Possible ligamentous or meniscus injury.  Discussed arthrocentesis to workup septic joint however patient stated that he did not want to wait and that he would return if his symptoms did not improve.  Discussed the risk of long-term damage to the joint with chronic arthritis, infection or death.  Patient expressed understanding and states that he would return for any worsening symptoms.  Patient signed out AGAINST MEDICAL ADVICE, had full capacity.   Corena Herter, MD 09/22/23 2314

## 2023-09-22 NOTE — ED Notes (Signed)
Pt yelling out that he hasn't smoked a cigarette in 5 hours and that he wants to go home. Pt offered a nicotine patch- EDP notified.

## 2023-09-22 NOTE — ED Notes (Signed)
Pt advises he's unable to stay for arthrocentesis due to transportation limitations. EDP at bedside counseling pt. Pt states he will have to come back in the morning to have procedure done. Pt counseled by EDP on risks of leaving, pt verbalizes understanding. AMA signed and placed in medical records.

## 2023-09-22 NOTE — ED Triage Notes (Signed)
Pt comes with c/o right knee pain. Pt states he worked today and once he got home he started to feel the pain in his knee. Pt states no known injuries

## 2023-09-23 ENCOUNTER — Emergency Department
Admission: EM | Admit: 2023-09-23 | Discharge: 2023-09-23 | Disposition: A | Discharge status: Home / Self Care | Payer: Commercial Managed Care - HMO | Attending: Emergency Medicine | Admitting: Emergency Medicine

## 2023-09-23 DIAGNOSIS — M25461 Effusion, right knee: Secondary | ICD-10-CM | POA: Insufficient documentation

## 2023-09-23 DIAGNOSIS — M25561 Pain in right knee: Secondary | ICD-10-CM | POA: Diagnosis present

## 2023-09-23 LAB — CBC WITH DIFFERENTIAL/PLATELET
Abs Immature Granulocytes: 0.03 10*3/uL (ref 0.00–0.07)
Basophils Absolute: 0 10*3/uL (ref 0.0–0.1)
Basophils Relative: 0 %
Eosinophils Absolute: 0.1 10*3/uL (ref 0.0–0.5)
Eosinophils Relative: 1 %
HCT: 36.9 % — ABNORMAL LOW (ref 39.0–52.0)
Hemoglobin: 12.4 g/dL — ABNORMAL LOW (ref 13.0–17.0)
Immature Granulocytes: 1 %
Lymphocytes Relative: 21 %
Lymphs Abs: 1.3 10*3/uL (ref 0.7–4.0)
MCH: 29.7 pg (ref 26.0–34.0)
MCHC: 33.6 g/dL (ref 30.0–36.0)
MCV: 88.3 fL (ref 80.0–100.0)
Monocytes Absolute: 0.6 10*3/uL (ref 0.1–1.0)
Monocytes Relative: 9 %
Neutro Abs: 4.3 10*3/uL (ref 1.7–7.7)
Neutrophils Relative %: 68 %
Platelets: 208 10*3/uL (ref 150–400)
RBC: 4.18 MIL/uL — ABNORMAL LOW (ref 4.22–5.81)
RDW: 12.9 % (ref 11.5–15.5)
WBC: 6.2 10*3/uL (ref 4.0–10.5)
nRBC: 0 % (ref 0.0–0.2)

## 2023-09-23 LAB — SYNOVIAL CELL COUNT + DIFF, W/ CRYSTALS
Crystals, Fluid: NONE SEEN
Eosinophils-Synovial: 0 %
Lymphocytes-Synovial Fld: 0 %
Monocyte-Macrophage-Synovial Fluid: 27 %
Neutrophil, Synovial: 73 %
WBC, Synovial: 13076 /mm3 — ABNORMAL HIGH (ref 0–200)

## 2023-09-23 LAB — BASIC METABOLIC PANEL
Anion gap: 12 (ref 5–15)
BUN: 12 mg/dL (ref 6–20)
CO2: 25 mmol/L (ref 22–32)
Calcium: 9 mg/dL (ref 8.9–10.3)
Chloride: 100 mmol/L (ref 98–111)
Creatinine, Ser: 0.76 mg/dL (ref 0.61–1.24)
GFR, Estimated: >60 mL/min (ref >60)
Glucose, Bld: 89 mg/dL (ref 70–99)
Potassium: 3.7 mmol/L (ref 3.5–5.1)
Sodium: 137 mmol/L (ref 135–145)

## 2023-09-23 MED ORDER — LIDOCAINE HCL (PF) 1 % IJ SOLN
10.0000 mL | Freq: Once | INTRAMUSCULAR | Status: AC
  Administered 2023-09-23: 10 mL
  Filled 2023-09-23: qty 10

## 2023-09-23 NOTE — ED Triage Notes (Signed)
Pt to ED for R knee pain since yesterday. States that 5 years ago he had abscess to same area and was septic. Knee is swollen and hard to bend knee. Ambulatory, using cane (only because of pain).  VSS

## 2023-09-23 NOTE — Discharge Instructions (Signed)
Keep the leg elevated is much as possible and apply ice.  You can start to bear weight on it as much as you are able to tolerate.  Take ibuprofen or Tylenol as needed for the pain.  Return to the ER for new, worsening, or persistent severe swelling, redness, inability to move the leg, pain going up the leg, fever, or any other new or worsening symptoms that concern you.

## 2023-09-23 NOTE — ED Provider Notes (Signed)
[facility] Provider Note    Event Date/Time   First MD Initiated Contact with Patient 09/23/23 1547     (approximate)   History   Joint Swelling (R)   HPI  James Beard. is a 38 y.o. male with a prior history of IVDU (not the last 5 years) and prior septic arthritis related to this who presents with right knee pain and swelling over the last 2 days.  He denies falling but states he did slip on a steep hill.  He states the pain is worse if he puts any weight on it.  He reports swelling and fluid around the knee.  It is difficult for him to bend it.  The patient was seen in the ED yesterday for this and was plan for an arthrocentesis, but needed to leave AMA before it could be done.   I reviewed the past medical records I confirmed the above history and the evaluation yesterday.  The patient has no recent hospitalizations.  Previously his most recent documented outpatient encounter was with family medicine on 07/14/2022 for cough.  Physical Exam   Triage Vital Signs: ED Triage Vitals  Encounter Vitals Group     BP 09/23/23 1252 124/89     Systolic BP Percentile --      Diastolic BP Percentile --      Pulse Rate 09/23/23 1252 62     Resp 09/23/23 1252 16     Temp 09/23/23 1252 98.2 F (36.8 C)     Temp Source 09/23/23 1252 Oral     SpO2 09/23/23 1252 95 %     Weight 09/23/23 1249 165 lb (74.8 kg)     Height 09/23/23 1249 5\' 9"  (1.753 m)     Head Circumference --      Peak Flow --      Pain Score 09/23/23 1249 6     Pain Loc --      Pain Education --      Exclude from Growth Chart --     Most recent vital signs: Vitals:   09/23/23 1815 09/23/23 1815  BP: 125/87   Pulse: 63   Resp: 17   Temp:  98.2 F (36.8 C)  SpO2: 96%      General: Awake, no distress.  CV:  Good peripheral perfusion.  Resp:  Normal effort.  Abd:  No distention.  Other:  Small to moderate right knee effusion.  Range of motion limited to approximately 20  degrees of flexion due to pain.  Slight warmth.  No erythema, induration, or fluctuance.   ED Results / Procedures / Treatments   Labs (all labs ordered are listed, but only abnormal results are displayed) Labs Reviewed  CBC WITH DIFFERENTIAL/PLATELET - Abnormal; Notable for the following components:      Result Value   RBC 4.18 (*)    Hemoglobin 12.4 (*)    HCT 36.9 (*)    All other components within normal limits  SYNOVIAL CELL COUNT + DIFF, W/ CRYSTALS - Abnormal; Notable for the following components:   Color, Synovial YELLOW (*)    Appearance-Synovial HAZY (*)    WBC, Synovial 13,076 (*)    All other components within normal limits  BODY FLUID CULTURE W GRAM STAIN  BASIC METABOLIC PANEL  GLUCOSE, BODY FLUID OTHER            PROTEIN, BODY FLUID (OTHER)     EKG     RADIOLOGY  PROCEDURES:  Critical Care performed: No  .Joint Aspiration/Arthrocentesis  Date/Time: 09/23/2023 6:06 PM  Performed by: Dionne Bucy, MD Authorized by: Dionne Bucy, MD   Consent:    Consent obtained:  Verbal   Consent given by:  Patient   Risks discussed:  Bleeding, infection, pain and incomplete drainage   Alternatives discussed:  Observation Universal protocol:    Patient identity confirmed:  Verbally with patient Location:    Location:  Knee   Knee:  R knee Anesthesia:    Anesthesia method:  Local infiltration   Local anesthetic:  Lidocaine 1% w/o epi Procedure details:    Preparation: Patient was prepped and draped in usual sterile fashion     Approach:  Lateral   Aspirate amount:  20mL   Aspirate characteristics:  Cloudy and yellow   Specimen collected: yes   Post-procedure details:    Dressing:  Sterile dressing   Procedure completion:  Tolerated well, no immediate complications    MEDICATIONS ORDERED IN ED: Medications  lidocaine (PF) (XYLOCAINE) 1 % injection 10 mL (10 mLs Other Given by Other 09/23/23 1618)     IMPRESSION / MDM / ASSESSMENT  AND PLAN / ED COURSE  I reviewed the triage vital signs and the nursing notes.  Differential diagnosis includes, but is not limited to, traumatic joint effusion, osteoarthritis, gout, pseudogout, less likely septic arthritis.  Patient's presentation is most consistent with acute complicated illness / injury requiring diagnostic workup.  Patient needed to leave AMA yesterday before a arthrocentesis could be performed; he is now ready to have this done.  BMP and CBC are unremarkable.  Overall suspicion for septic arthritis is low but given the atraumatic effusion and the patient's history we will obtain a synovial fluid sample for testing.  ----------------------------------------- 7:10 PM on 09/23/2023 -----------------------------------------  Synovial fluid shows 13,000 RBCs with no crystals or other concerning acute findings.  There is no indication to wait for the protein or glucose.  This is not consistent with septic arthritis and agrees with the patient's reassuring exam.  The patient reports improved pain with the fluid being taken off.  I suspect that this is a traumatic effusion due to his slip injury from a few days ago.  I recommended RICE precautions.  The patient is stable for discharge home at this time.  Return precautions given, and he expresses understanding.   FINAL CLINICAL IMPRESSION(S) / ED DIAGNOSES   Final diagnoses:  Effusion of right knee     Rx / DC Orders   ED Discharge Orders     None        Note:  This document was prepared using Dragon voice recognition software and may include unintentional dictation errors.    Dionne Bucy, MD 09/23/23 1911

## 2023-09-25 LAB — PROTEIN, BODY FLUID (OTHER): Total Protein, Body Fluid Other: 3.4 g/dL

## 2023-09-26 LAB — GLUCOSE, BODY FLUID OTHER: Glucose, Body Fluid Other: 49 mg/dL

## 2023-09-27 LAB — BODY FLUID CULTURE W GRAM STAIN

## 2023-10-02 ENCOUNTER — Ambulatory Visit: Payer: Commercial Managed Care - HMO | Admitting: Nurse Practitioner

## 2023-10-03 ENCOUNTER — Encounter: Payer: Self-pay | Admitting: Nurse Practitioner

## 2023-11-11 IMAGING — CR DG TIBIA/FIBULA 2V*L*
5 series · 5 of 5 positions shown · non-contrast
Comparison: None.

CLINICAL DATA: Mid left foreleg cellulitis, pain

EXAM:
LEFT TIBIA AND FIBULA - 2 VIEW

[tibia ap (1 of 2)]
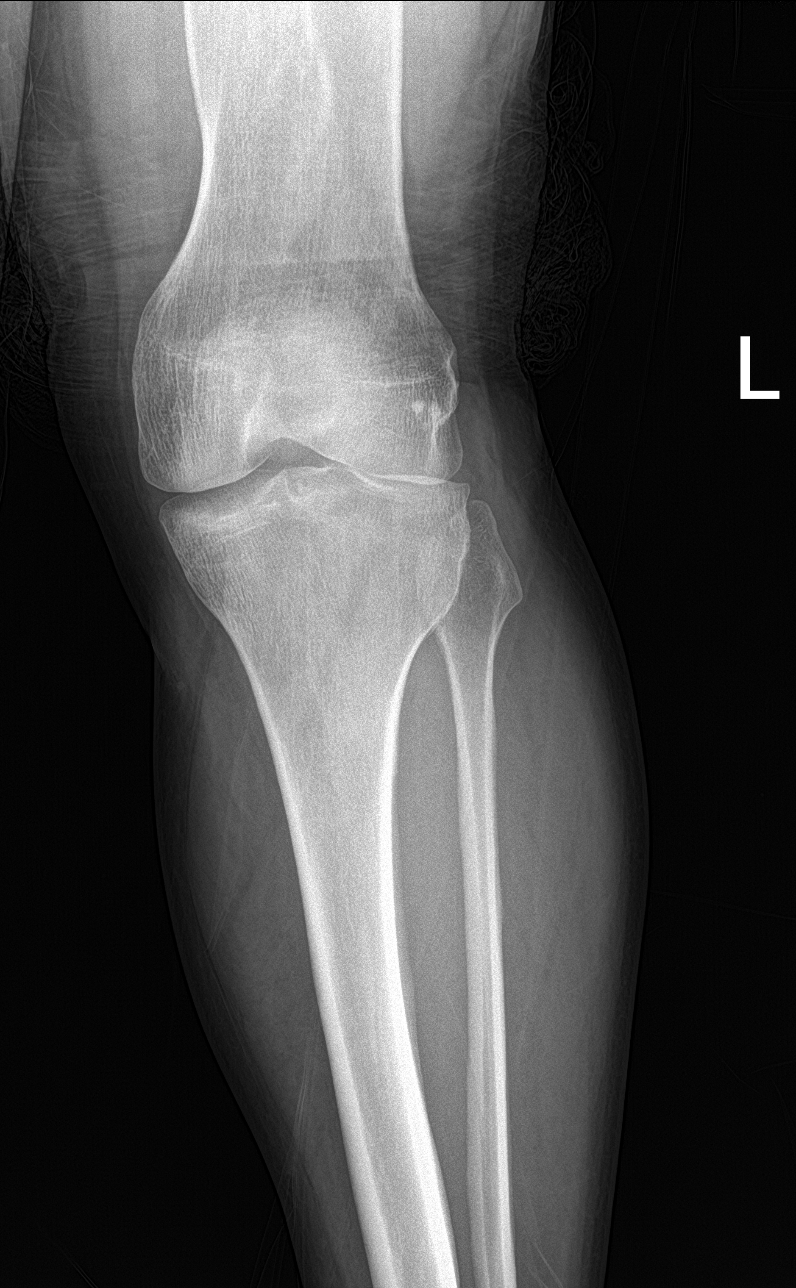

[tibia ap (2 of 2)]
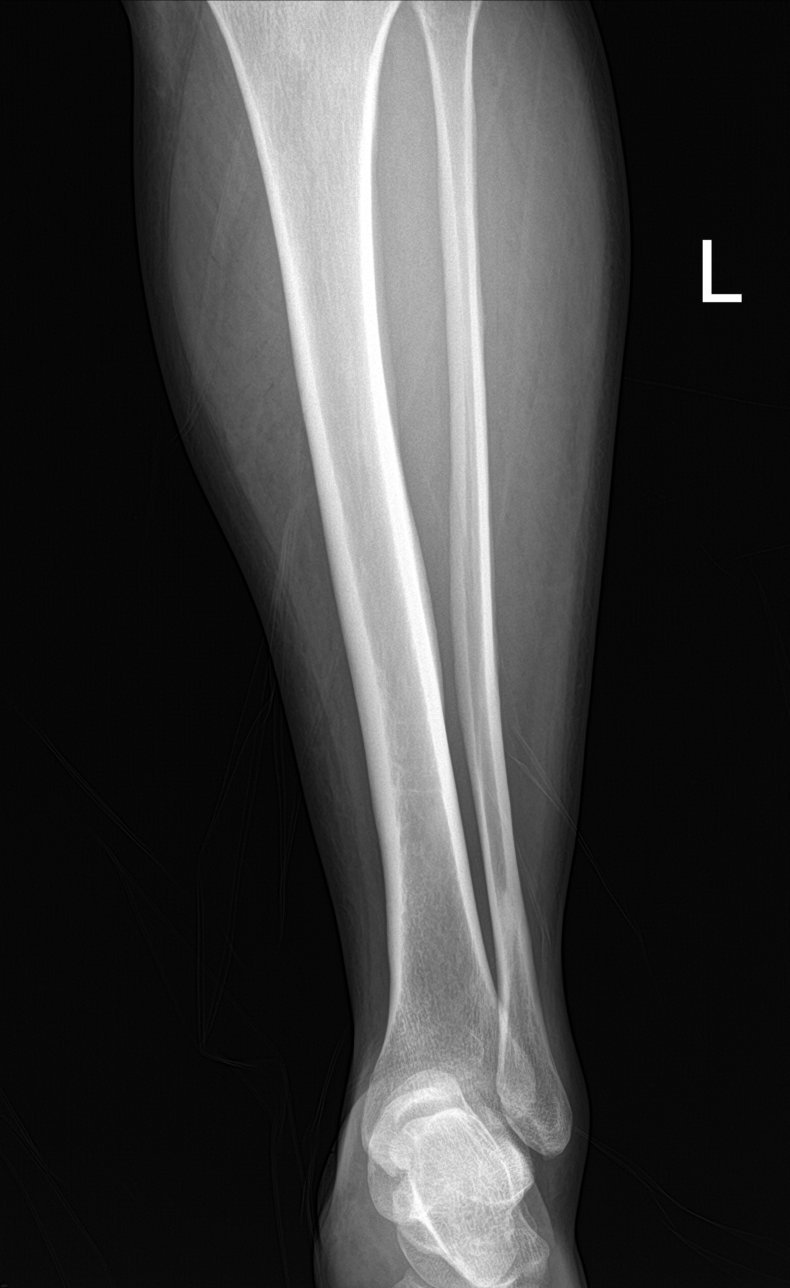

[tibia lat (1 of 3)]
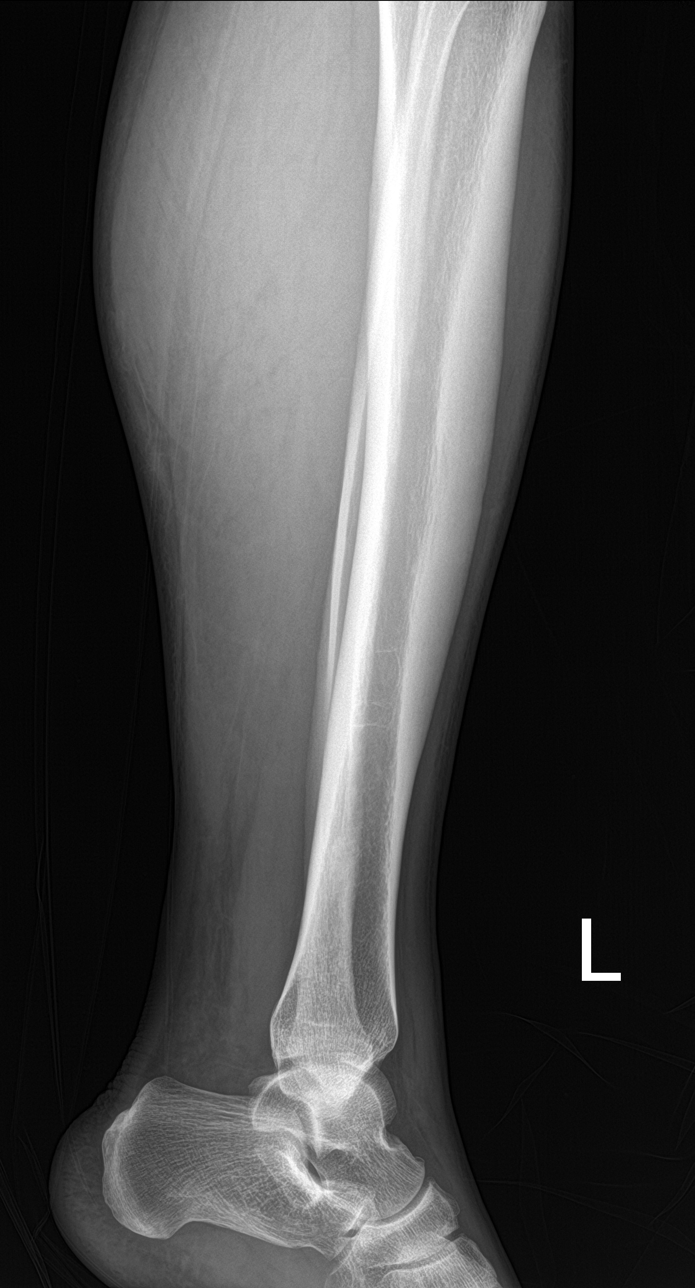

[tibia lat (2 of 3)]
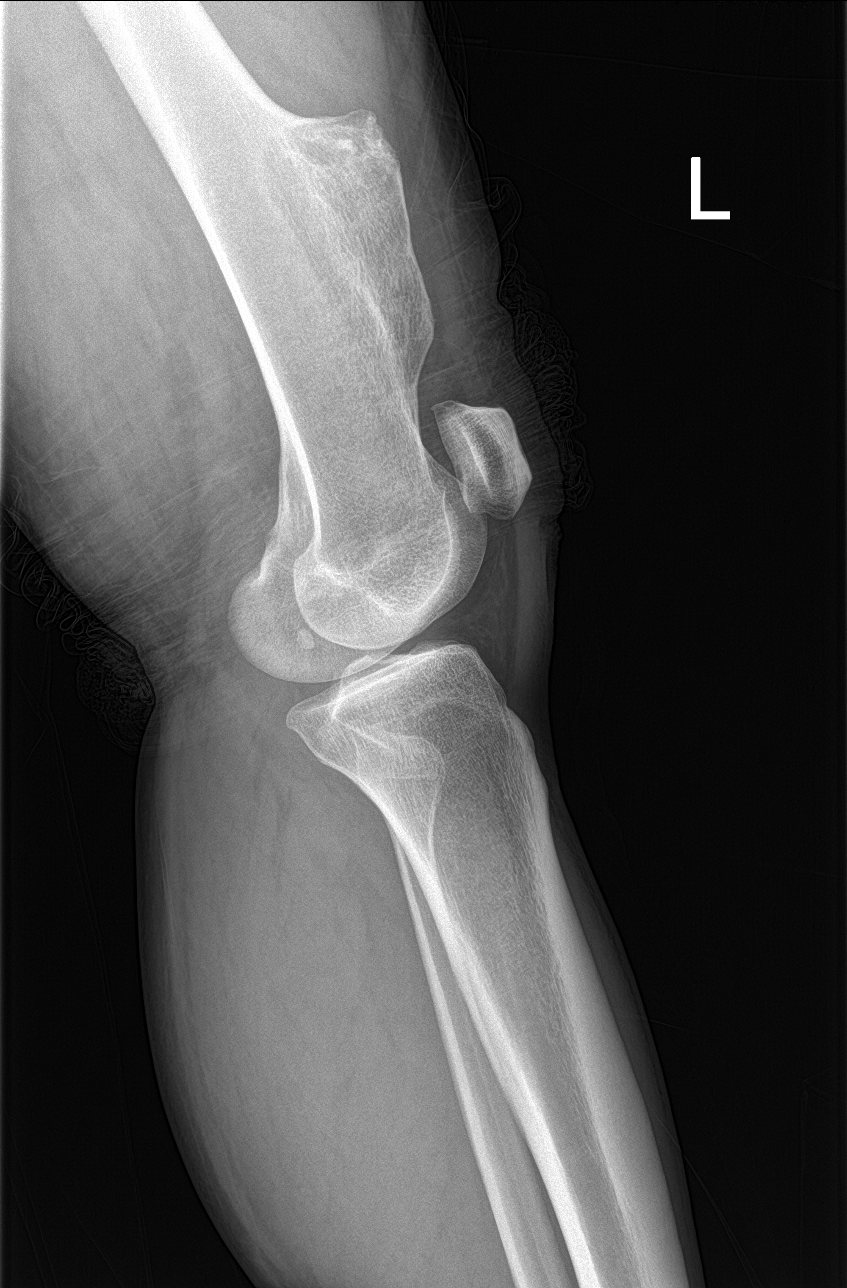

[tibia lat (3 of 3)]
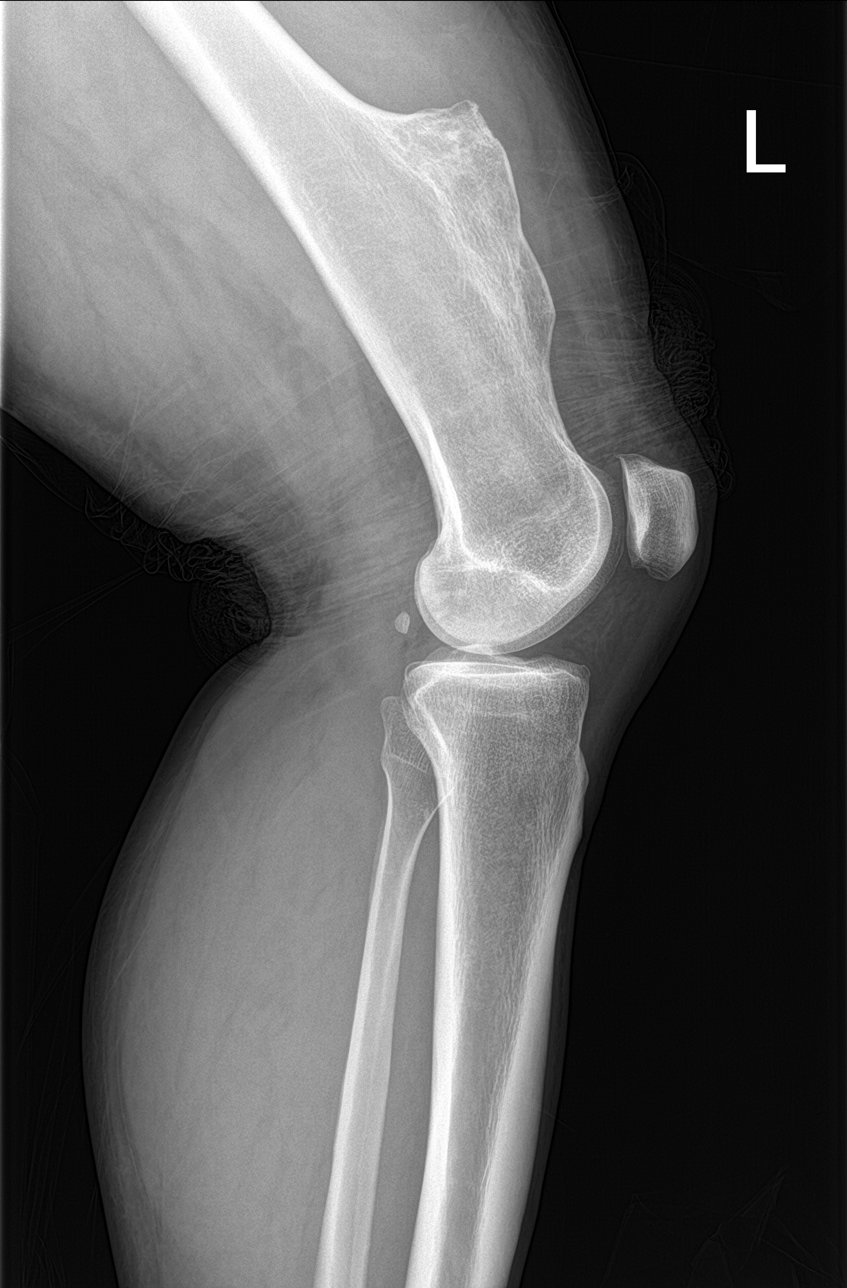

[5 of 5 positions shown; findings below may reference images not displayed]

FINDINGS: Normal alignment. No acute fracture or dislocation. No erosions or
abnormal periosteal reaction. Soft tissues are unremarkable.
Incidentally noted is a expansile excrescence of a bone arising
anteromedially from the distal diaphysis of the left femur
demonstrating a continuous trabecular pattern within the medullary
space of the distal diaphysis as is most in keeping with an
osteochondroma measuring at least 4.2 x 9.0 cm.
IMPRESSION: No acute abnormality involving the left foreleg.

Incompletely evaluated ossific mass arising from the distal left
femoral diaphysis most in keeping with an osteochondroma. Given its
size, this would be better assessed with MRI examination if prior
examinations are unavailable to document stability.

## 2023-11-12 IMAGING — CR DG CHEST 2V
2 series · 2 of 2 positions shown · non-contrast
Comparison: 09/27/2018

CLINICAL DATA: Dizziness.

EXAM:
CHEST - 2 VIEW

[chest lat]
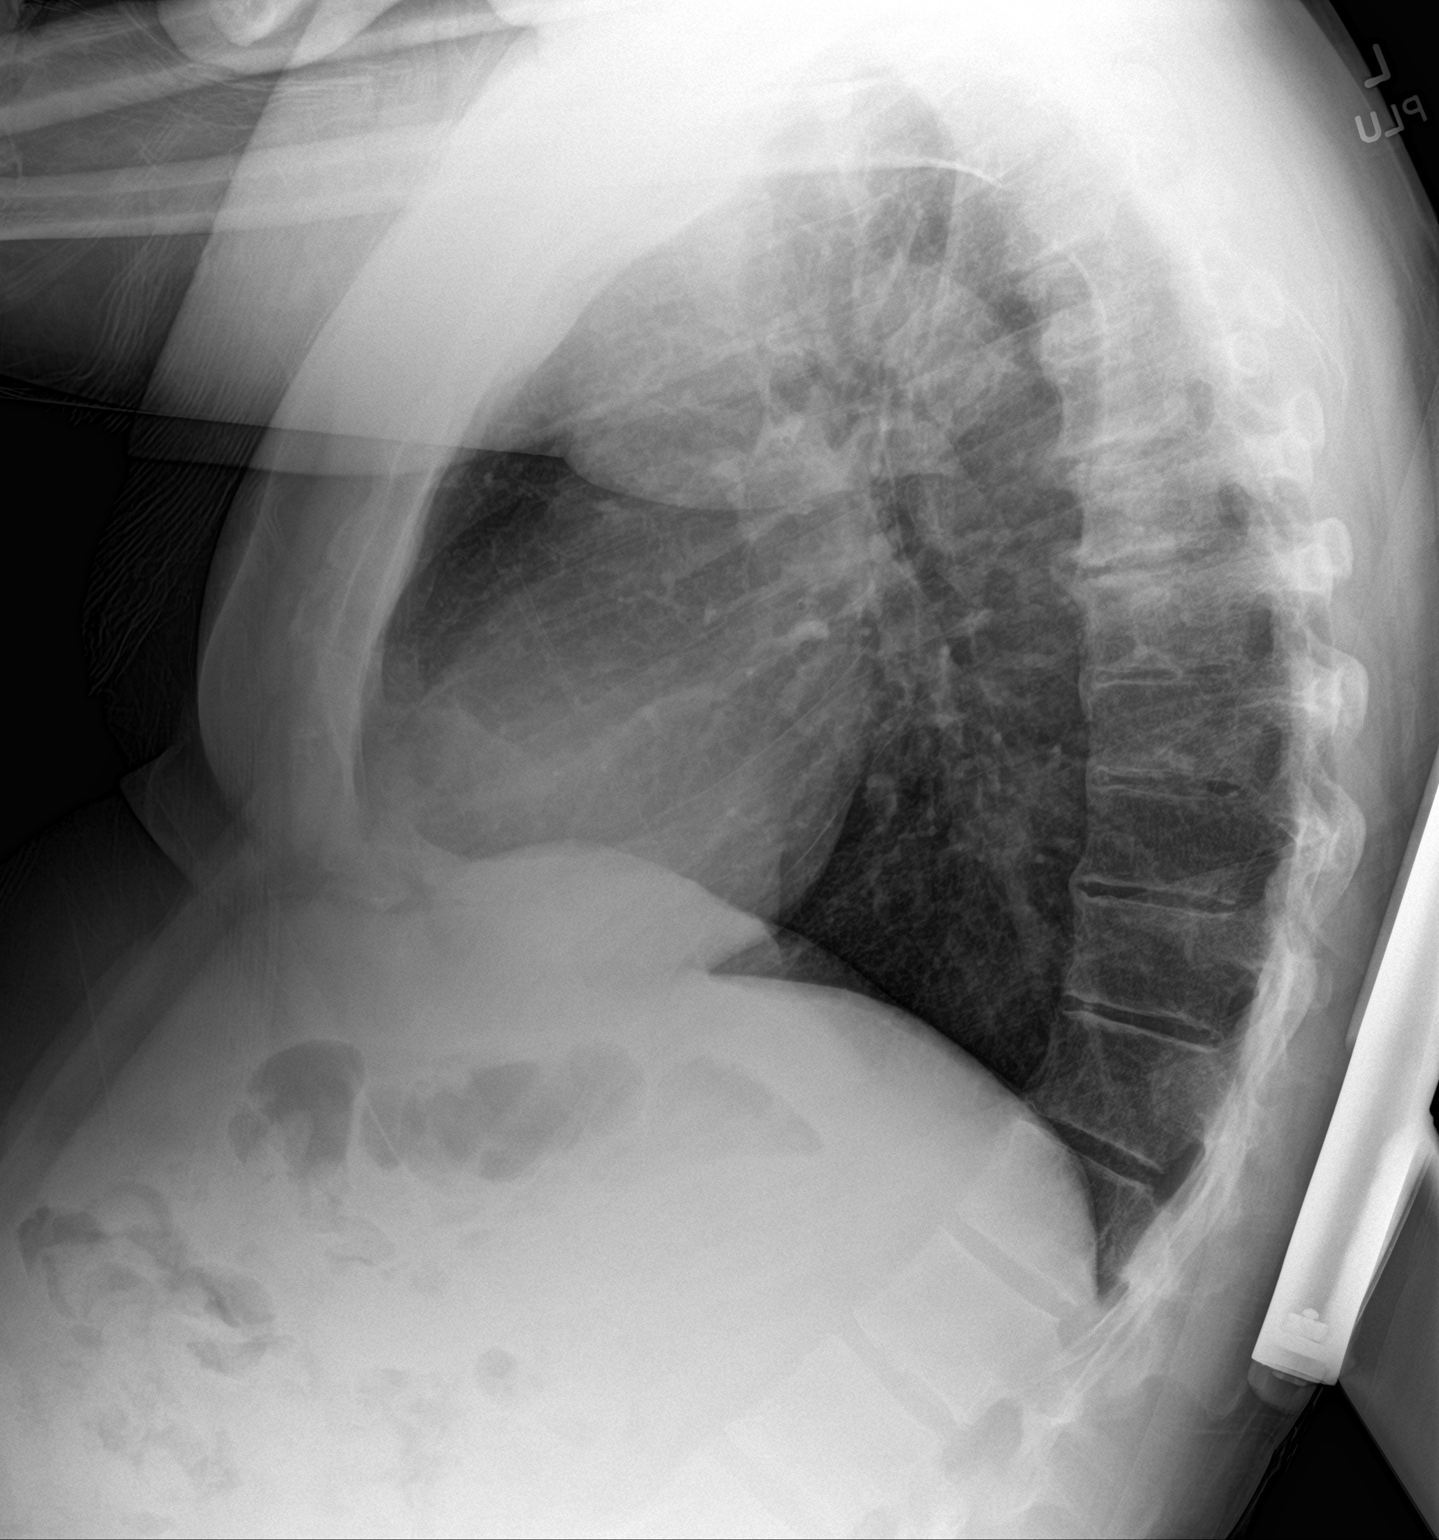

[chest ap]
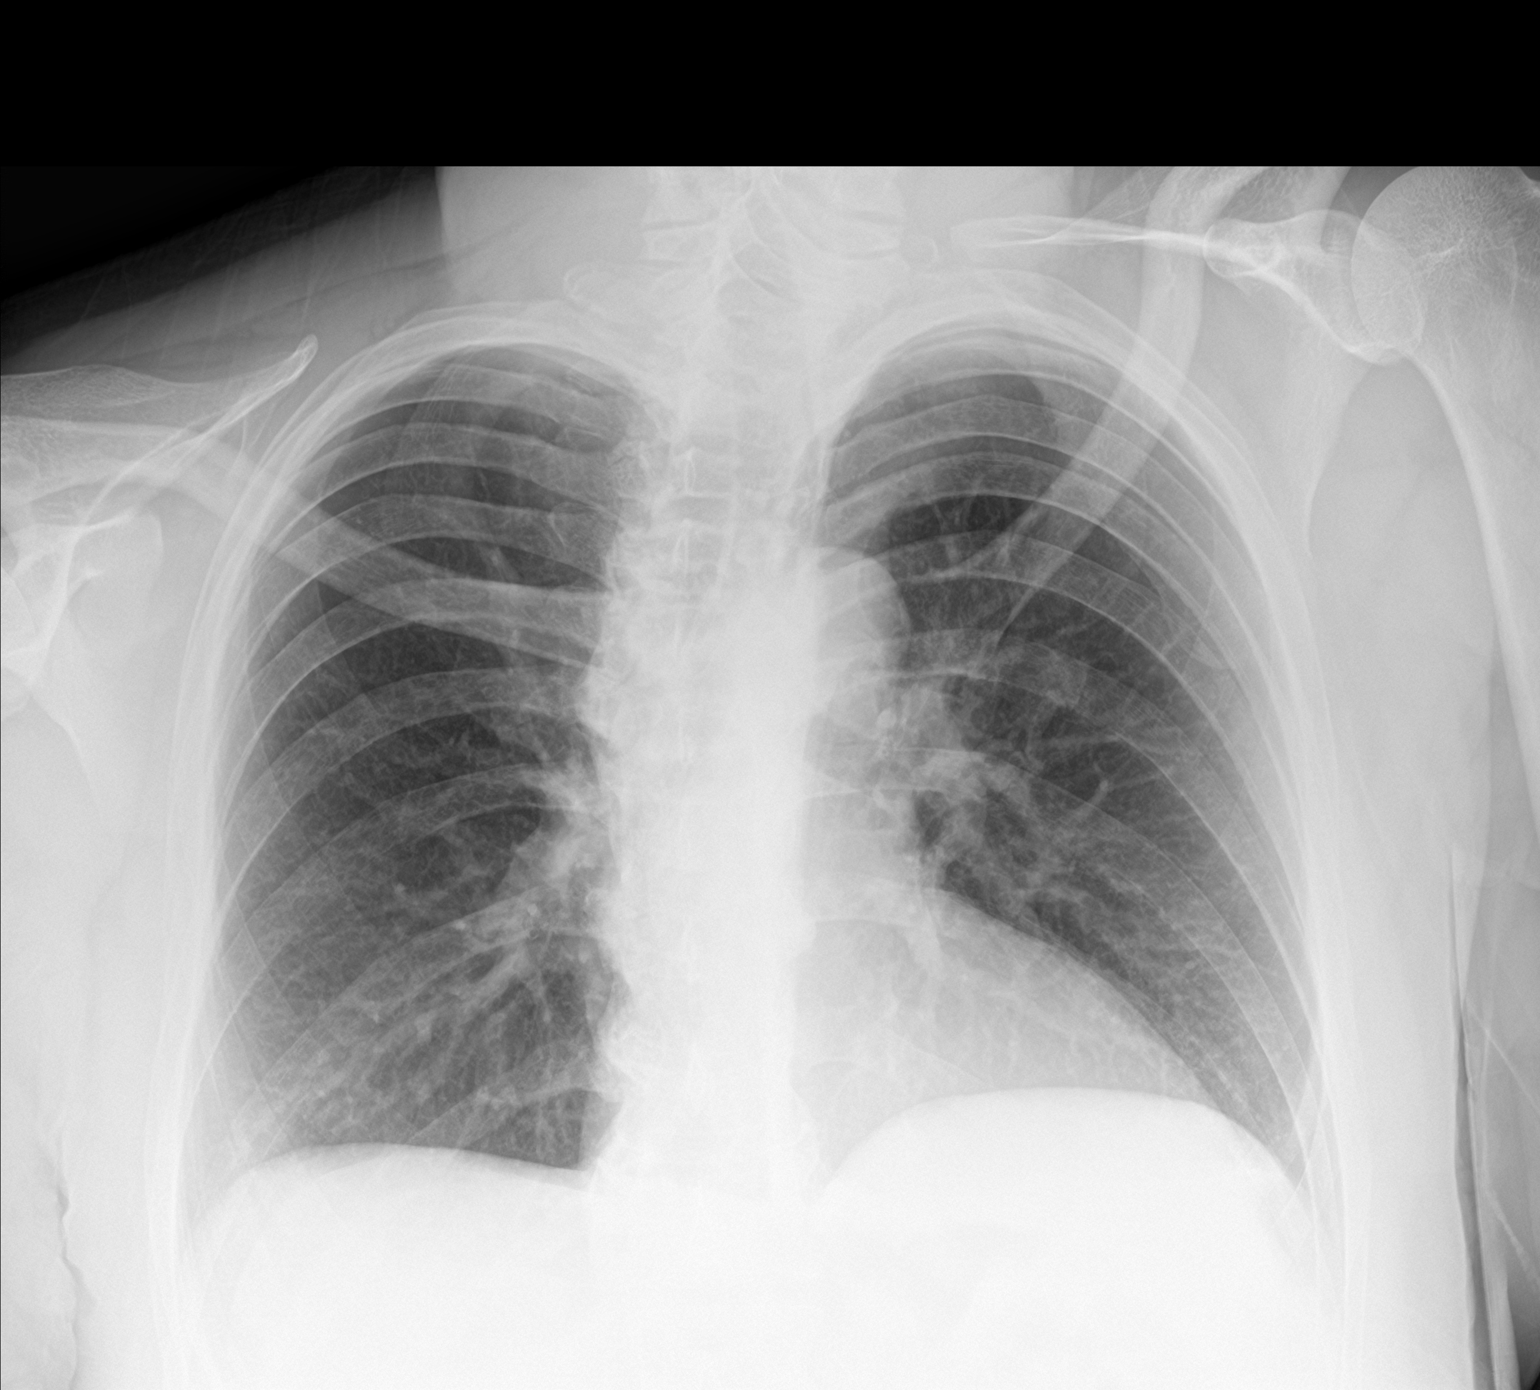

[2 of 2 positions shown; findings below may reference images not displayed]

FINDINGS: The lungs are clear without focal pneumonia, edema, pneumothorax or
pleural effusion. The cardiopericardial silhouette is within normal
limits for size. The visualized bony structures of the thorax show
no acute abnormality.
IMPRESSION: No active cardiopulmonary disease.

## 2023-11-13 ENCOUNTER — Encounter: Payer: Self-pay | Admitting: Intensive Care

## 2023-11-13 ENCOUNTER — Other Ambulatory Visit: Payer: Self-pay

## 2023-11-13 ENCOUNTER — Emergency Department
Admission: EM | Admit: 2023-11-13 | Discharge: 2023-11-13 | Disposition: A | Discharge status: Home / Self Care | Payer: Self-pay | Attending: Emergency Medicine | Admitting: Emergency Medicine

## 2023-11-13 DIAGNOSIS — L02414 Cutaneous abscess of left upper limb: Secondary | ICD-10-CM | POA: Diagnosis present

## 2023-11-13 DIAGNOSIS — L0291 Cutaneous abscess, unspecified: Secondary | ICD-10-CM

## 2023-11-13 LAB — COMPREHENSIVE METABOLIC PANEL
ALT: 16 U/L (ref 0–44)
AST: 18 U/L (ref 15–41)
Albumin: 4.4 g/dL (ref 3.5–5.0)
Alkaline Phosphatase: 76 U/L (ref 38–126)
Anion gap: 9 (ref 5–15)
BUN: 16 mg/dL (ref 6–20)
CO2: 29 mmol/L (ref 22–32)
Calcium: 9.2 mg/dL (ref 8.9–10.3)
Chloride: 100 mmol/L (ref 98–111)
Creatinine, Ser: 0.75 mg/dL (ref 0.61–1.24)
GFR, Estimated: >60 mL/min (ref >60)
Glucose, Bld: 109 mg/dL — ABNORMAL HIGH (ref 70–99)
Potassium: 4 mmol/L (ref 3.5–5.1)
Sodium: 138 mmol/L (ref 135–145)
Total Bilirubin: 0.6 mg/dL (ref 0.0–1.2)
Total Protein: 7.8 g/dL (ref 6.5–8.1)

## 2023-11-13 LAB — CBC WITH DIFFERENTIAL/PLATELET
Abs Immature Granulocytes: 0.02 10*3/uL (ref 0.00–0.07)
Basophils Absolute: 0 10*3/uL (ref 0.0–0.1)
Basophils Relative: 0 %
Eosinophils Absolute: 0.1 10*3/uL (ref 0.0–0.5)
Eosinophils Relative: 2 %
HCT: 39.4 % (ref 39.0–52.0)
Hemoglobin: 13.3 g/dL (ref 13.0–17.0)
Immature Granulocytes: 0 %
Lymphocytes Relative: 19 %
Lymphs Abs: 1.5 10*3/uL (ref 0.7–4.0)
MCH: 30.6 pg (ref 26.0–34.0)
MCHC: 33.8 g/dL (ref 30.0–36.0)
MCV: 90.6 fL (ref 80.0–100.0)
Monocytes Absolute: 0.7 10*3/uL (ref 0.1–1.0)
Monocytes Relative: 10 %
Neutro Abs: 5.4 10*3/uL (ref 1.7–7.7)
Neutrophils Relative %: 69 %
Platelets: 208 10*3/uL (ref 150–400)
RBC: 4.35 MIL/uL (ref 4.22–5.81)
RDW: 13.2 % (ref 11.5–15.5)
WBC: 7.8 10*3/uL (ref 4.0–10.5)
nRBC: 0 % (ref 0.0–0.2)

## 2023-11-13 MED ORDER — AMOXICILLIN-POT CLAVULANATE ER 1000-62.5 MG PO TB12
1.0000 | ORAL_TABLET | Freq: Two times a day (BID) | ORAL | 0 refills | Status: DC
Start: 2023-11-13 — End: 2023-11-20

## 2023-11-13 MED ORDER — LIDOCAINE HCL (PF) 1 % IJ SOLN
5.0000 mL | Freq: Once | INTRAMUSCULAR | Status: DC
  Filled 2023-11-13: qty 5

## 2023-11-13 NOTE — ED Provider Triage Note (Signed)
 Emergency Medicine Provider Triage Evaluation Note  Kaleo Condrey. , a 38 y.o. male  was evaluated in triage.  Pt complains of "boil" to L shoulder. He reports picking a scab off the shoulder and its draining pus for a month.  Review of Systems  Positive: Abscess of L shoulder Negative: fever  Physical Exam  There were no vitals taken for this visit. Gen:   Awake, no distress   Resp:  Normal effort  MSK:   Moves extremities without difficulty  Other:    Medical Decision Making  Medically screening exam initiated at 6:18 PM.  Appropriate orders placed.  Dravon Nott. was informed that the remainder of the evaluation will be completed by another provider, this initial triage assessment does not replace that evaluation, and the importance of remaining in the ED until their evaluation is complete.  Labs at this time   Lanette Hampshire 11/13/23 1821

## 2023-11-13 NOTE — Discharge Instructions (Addendum)
 Have been diagnosed with a abscess in your left shoulder.  This take Augmentin 1 tablet by mouth 2 times daily for 7 days.  Remove the drainage tomorrow.  This come back to ED or go to your PCP if you have new symptoms or symptoms worsen

## 2023-11-13 NOTE — ED Triage Notes (Signed)
 Patient c/o abscess on left shoulder that has been there for a long time. Reports it is now oozing drainage and redness around area

## 2023-11-13 NOTE — ED Provider Notes (Signed)
 [facility] Provider Note    Event Date/Time   First MD Initiated Contact with Patient 11/13/23 2110     (approximate)   History   Abscess   HPI  James Beard. is a 38 y.o. male who presents today with history of abscess on his left scapula.  Patient states he drained yesterday but it is growing today.  Patient denies fever     Physical Exam   Triage Vital Signs: ED Triage Vitals  Encounter Vitals Group     BP 11/13/23 1821 (!) 135/91     Systolic BP Percentile --      Diastolic BP Percentile --      Pulse Rate 11/13/23 1821 86     Resp 11/13/23 1821 15     Temp 11/13/23 1821 98.6 F (37 C)     Temp Source 11/13/23 1821 Oral     SpO2 11/13/23 1821 97 %     Weight 11/13/23 1822 170 lb (77.1 kg)     Height 11/13/23 1822 5\' 9"  (1.753 m)     Head Circumference --      Peak Flow --      Pain Score 11/13/23 1821 8     Pain Loc --      Pain Education --      Exclude from Growth Chart --     Most recent vital signs: Vitals:   11/13/23 1821  BP: (!) 135/91  Pulse: 86  Resp: 15  Temp: 98.6 F (37 C)  SpO2: 97%     Constitutional: Alert, NAD. Able to speak in complete sentences without cough or dyspnea  Eyes: Conjunctivae are normal.  Head: Atraumatic. Nose: No congestion/rhinnorhea. Mouth/Throat: Mucous membranes are moist.   Neck: Painless ROM. Supple. No JVD, nodes, thyromegaly  Cardiovascular:   Good peripheral circulation.RRR no murmurs, gallops, rubs  Respiratory: Normal respiratory effort.  No retractions. Clear to auscultation bilaterally without wheezing or crackles  Gastrointestinal: Soft and nontender.  Musculoskeletal:  no deformity Neurologic:  MAE spontaneously. No gross focal neurologic deficits are appreciated.  Skin: Left shoulder: Presence of abscess about 7 cm, circular, with erythema, hard nurse, warmth around Skin is warm, dry and intact. No rash noted. Psychiatric: Mood and affect are normal. Speech and  behavior are normal.    ED Results / Procedures / Treatments   Labs (all labs ordered are listed, but only abnormal results are displayed) Labs Reviewed  COMPREHENSIVE METABOLIC PANEL - Abnormal; Notable for the following components:      Result Value   Glucose, Bld 109 (*)    All other components within normal limits  CBC WITH DIFFERENTIAL/PLATELET     EKG     RADIOLOGY     PROCEDURES:  Critical Care performed:   .Incision and Drainage  Date/Time: 11/13/2023 10:47 PM  Performed by: Gladys Damme, PA-C Authorized by: Gladys Damme, PA-C   Consent:    Consent obtained:  Verbal   Consent given by:  Patient   Risks discussed:  Incomplete drainage Universal protocol:    Procedure explained and questions answered to patient or proxy's satisfaction: yes     Patient identity confirmed:  Verbally with patient Location:    Type:  Abscess   Location:  Upper extremity   Upper extremity location:  Shoulder   Shoulder location:  L shoulder Pre-procedure details:    Skin preparation:  Chlorhexidine Sedation:    Sedation type:  None Anesthesia:    Anesthesia method:  Local infiltration   Local anesthetic:  Lidocaine 1% WITH epi Procedure type:    Complexity:  Simple Procedure details:    Ultrasound guidance: no     Needle aspiration: no     Incision types:  Stab incision   Drainage:  Purulent   Drainage amount:  Moderate   Wound treatment:  Drain placed   Packing materials:  1/4 in iodoform gauze Post-procedure details:    Procedure completion:  Tolerated well, no immediate complications    MEDICATIONS ORDERED IN ED: Medications  lidocaine (PF) (XYLOCAINE) 1 % injection 5 mL (has no administration in time range)   Clinical Course as of 11/13/23 2245  Mon Nov 13, 2023  2148 Comprehensive metabolic panel(!) Electrolytes, renal function and liver function within normal limits [AE]  2148 CBC with Differential White blood cell, hemoglobin within normal  limits [AE]    Clinical Course User Index [AE] Gladys Damme, PA-C    IMPRESSION / MDM / ASSESSMENT AND PLAN / ED COURSE  I reviewed the triage vital signs and the nursing notes.  Differential diagnosis includes, but is not limited to, abscess, cellulitis, foreign body  Patient's presentation is most consistent with acute, uncomplicated illness.   Patient's diagnosis is consistent with abscess left shoulder.I did review the patient's allergies and medications.The patient is in stable and satisfactory condition for discharge home  Patient will be discharged home with prescriptions for Augmentin. Patient is to follow up with PCP as needed or otherwise directed. Patient is given ED precautions to return to the ED for any worsening or new symptoms. Discussed plan of care with patient, answered all of patient's questions, Patient agreeable to plan of care. Advised patient to take medications according to the instructions on the label. Discussed possible side effects of new medications. Patient verbalized understanding.    FINAL CLINICAL IMPRESSION(S) / ED DIAGNOSES   Final diagnoses:  None     Rx / DC Orders   ED Discharge Orders     None        Note:  This document was prepared using Dragon voice recognition software and may include unintentional dictation errors.   Gladys Damme, PA-C 11/13/23 2250    Trinna Post, MD 11/13/23 [phone]

## 2023-11-16 ENCOUNTER — Ambulatory Visit: Payer: Self-pay | Admitting: Nurse Practitioner

## 2023-11-16 NOTE — Telephone Encounter (Signed)
 Copied from CRM [id number]. Topic: Clinical - Red Word Triage >> Nov 16, 2023 10:15 AM [place] J wrote: [facility] that prompted transfer to Nurse Triage: Patient was admitted to ER recently to get an abscess removed. Patient is feeling sick and can't afford medication.  Chief Complaint: nausea, weakness Symptoms: nausea-stomach feels uneasy all the time therefore unable to eat, weak, tired, drained, fatigued Frequency: x 1 month Pertinent Negatives: Patient denies fever Disposition: [] ED /[] Urgent Care (no appt availability in office) / [] Appointment(In office/virtual)/ []  [facility]/ [] Home Care/ [] Refused Recommended Disposition /[] [facility]/ [x]  Follow-up with PCP Additional Notes: history of MRSA therefore recurrent abscess.  Went to ER had abscess drained and gave Rx for amoxicillin due to not having a job pt unable to get Rx filled.  Pt states tonight will be 3 days since abscess drained in  ER.  Drainage was milky bloody color and now yellow color: has odor "smells like death or rotten flesh".  Pt states nausea without vomiting, extreme fatigue/weak/tired ever since abscess (x1 month).  C/o bowel movement have changed, leass frequent, constipation.  Pt wants to know if he can get help with getting Rx or how long does he have before the infection gets bad please call number on chart or alternate number [phone]  Reason for Disposition  [1] Boil AND [2] not improved > 3 days following Care Advice  Answer Assessment - Initial Assessment Questions 1. NAUSEA SEVERITY: "How bad is the nausea?" (e.g., mild, moderate, severe; dehydration, weight loss)   - MILD: loss of appetite without change in eating habits   - MODERATE: decreased oral intake without significant weight loss, dehydration, or malnutrition   - SEVERE: inadequate caloric or fluid intake, significant weight loss, symptoms of dehydration     mild 2. ONSET: "When did the nausea begin?"     X 1 month 3.  VOMITING: "Any vomiting?" If Yes, ask: "How many times today?"     no 4. RECURRENT SYMPTOM: "Have you had nausea before?" If Yes, ask: "When was the last time?" "What happened that time?"     no 5. CAUSE: "What do you think is causing the nausea?"     unknown 6. PREGNANCY: "Is there any chance you are pregnant?" (e.g., unprotected intercourse, missed birth control pill, broken condom)     N/a  Answer Assessment - Initial Assessment Questions 1. DESCRIPTION: "Describe how you are feeling."     Overall tired, fatigue, weak - "even watching tv makes me tired" 2. SEVERITY: "How bad is it?"  "Can you stand and walk?"   - MILD (0-3): Feels weak or tired, but does not interfere with work, school or normal activities.   - MODERATE (4-7): Able to stand and walk; weakness interferes with work, school, or normal activities.   - SEVERE (8-10): Unable to stand or walk; unable to do usual activities.     moderate 3. ONSET: "When did these symptoms begin?" (e.g., hours, days, weeks, months)     X 1 month 4. CAUSE: "What do you think is causing the weakness or fatigue?" (e.g., not drinking enough fluids, medical problem, trouble sleeping)     Abscess, low appetite, low fluids 5. NEW MEDICINES:  "Have you started on any new medicines recently?" (e.g., opioid pain medicines, benzodiazepines, muscle relaxants, antidepressants, antihistamines, neuroleptics, beta blockers)     no 6. OTHER SYMPTOMS: "Do you have any other symptoms?" (e.g., chest pain, fever, cough, SOB, vomiting, diarrhea, bleeding, other areas of  pain)     no 7. PREGNANCY: "Is there any chance you are pregnant?" "When was your last menstrual period?"     N/a  Answer Assessment - Initial Assessment Questions 1. APPEARANCE of BOIL: "What does the boil look like?"      Drainage since ER yellow color. As of last night has odor "smells like death or rotten flesh".   2. LOCATION: "Where is the boil located?"      N/a 3. NUMBER: "How many boils  are there?"      1 4. SIZE: "How big is the boil?" (e.g., inches, cm; compare to size of a coin or other object)     N/a 5. ONSET: "When did the boil start?"     X 1 month 6. PAIN: "Is there any pain?" If Yes, ask: "How bad is the pain?"   (Scale 1-10; or mild, moderate, severe)     0 7. FEVER: "Do you have a fever?" If Yes, ask: "What is it, how was it measured, and when did it start?"      no 8. SOURCE: "Have you been around anyone with boils or other Staph infections?" "Have you ever had boils before?"    Has history of MRSA and boils 9. OTHER SYMPTOMS: "Do you have any other symptoms?" (e.g., shaking chills, weakness, rash elsewhere on body)     Tired, weak, fatigued 10. PREGNANCY: "Is there any chance you are pregnant?" "When was your last menstrual period?"       N/a  Protocols used: Nausea-A-AH, Weakness (Generalized) and Fatigue-A-AH, Boil (Skin Abscess)-A-AH

## 2023-11-17 MED ORDER — DOXYCYCLINE HYCLATE 100 MG PO TABS: 100.0000 mg | ORAL_TABLET | Freq: Two times a day (BID) | ORAL | 0 refills | Status: DC

## 2023-11-17 NOTE — Telephone Encounter (Signed)
 Called both numbers no answer left message on both

## 2023-11-17 NOTE — Telephone Encounter (Signed)
 Called patient number and emergency contact. Not able to reach anyone. Sending my chart message to see if patient will review.

## 2023-11-17 NOTE — Telephone Encounter (Signed)
 If history of MRSA, recommend he take antibiotic that would cover MRSA - I have sent doxycycline7d course to [facility] pharmacy in [place] off [address] (closest to [place]).  [facility] chosen because it is 4$ using good rx coupon.  It is $15 dollars at his normal [facility] pharmacy. Could send there if he prefers but it's more expensive.   If systemic symptoms, fever/chills, nausea/vomiting, abd pain, concern for spread of infection would recommend urgent care or ER evaluation tonight.

## 2023-11-20 NOTE — Telephone Encounter (Signed)
Lvm asking pt to call back.  Need to relay Dr. G's message.  

## 2023-11-22 NOTE — Telephone Encounter (Signed)
Lvm asking pt to call back.  Need to relay Dr. G's message.  

## 2023-11-23 NOTE — Telephone Encounter (Signed)
 Last read by [name] at 8:46AM on 11/21/2023.

## 2023-12-12 ENCOUNTER — Encounter: Payer: Self-pay | Admitting: Nurse Practitioner

## 2023-12-12 ENCOUNTER — Telehealth: Payer: Self-pay | Admitting: Physician Assistant

## 2023-12-12 DIAGNOSIS — K602 Anal fissure, unspecified: Secondary | ICD-10-CM

## 2023-12-12 MED ORDER — HYDROCORTISONE ACETATE 25 MG RE SUPP
25.0000 mg | Freq: Two times a day (BID) | RECTAL | 0 refills | Status: AC
Start: 2023-12-12 — End: ?

## 2023-12-12 NOTE — Progress Notes (Signed)
 Virtual Visit Consent   James Aigner., you are scheduled for a virtual visit with a [facility] provider today. Just as with appointments in the office, your consent must be obtained to participate. Your consent will be active for this visit and any virtual visit you may have with one of our providers in the next 365 days. If you have a MyChart account, a copy of this consent can be sent to you electronically.  As this is a virtual visit, video technology does not allow for your provider to perform a traditional examination. This may limit your provider's ability to fully assess your condition. If your provider identifies any concerns that need to be evaluated in person or the need to arrange testing (such as labs, EKG, etc.), we will make arrangements to do so. Although advances in technology are sophisticated, we cannot ensure that it will always work on either your end or our end. If the connection with a video visit is poor, the visit may have to be switched to a telephone visit. With either a video or telephone visit, we are not always able to ensure that we have a secure connection.  By engaging in this virtual visit, you consent to the provision of healthcare and authorize for your insurance to be billed (if applicable) for the services provided during this visit. Depending on your insurance coverage, you may receive a charge related to this service.  I need to obtain your verbal consent now. Are you willing to proceed with your visit today? James Forse. has provided verbal consent on 12/12/2023 for a virtual visit (video or telephone). Margaretann Loveless, PA-C  Date: 12/12/2023 10:45 AM   Virtual Visit via Video Note   I, Margaretann Loveless, connected with  James Beard.  ([id number], 1986/01/30) on 12/12/23 at 10:45 AM EDT by a video-enabled telemedicine application and verified that I am speaking with the correct person using two identifiers.  Location: Patient:  Virtual Visit Location Patient: Home Provider: Virtual Visit Location Provider: Home Office   I discussed the limitations of evaluation and management by telemedicine and the availability of in person appointments. The patient expressed understanding and agreed to proceed.    History of Present Illness: James Beard. is a 38 y.o. who identifies as a male who was assigned male at birth, and is being seen today for bloody bowel movements.  HPI: Rectal Bleeding  The current episode started yesterday. The onset was sudden (following a hard BM yesterday, then blood on stool and in toilet today following morning BM and a clot on TP when wiping). The problem occurs occasionally. The problem has been unchanged. The pain is mild. The stool is described as hard and streaked with blood. There was no prior successful therapy. There was no prior unsuccessful therapy. Associated symptoms include anorexia (reports for 4-5 years), abdominal pain (had some after BM today) and rectal pain (mild). Pertinent negatives include no fever, no diarrhea, no hemorrhoids, no nausea, no vomiting and no headaches.   PMH: Umbilical hernia   Problems:  Patient Active Problem List   Diagnosis Date Noted   Exposure to COVID-19 virus 07/14/2022   Sore throat 06/21/2022   Acute non-recurrent frontal sinusitis 06/21/2022   Strep pharyngitis 06/21/2022   Hepatitis C 06/03/2022   Arthritis of back 06/03/2022   Tobacco use 06/03/2022   Chronic thoracic spine pain 06/03/2022   Enlarged thyroid 06/03/2022   De Quervain's tenosynovitis, right 06/03/2022  Overweight 06/03/2022   Enlarged and hypertrophic nails 06/03/2022   Depression 04/13/2022   Septic arthritis (HCC) 02/11/2019   MSSA bacteremia 01/14/2019   Tenosynovitis of left ankle 01/08/2019   Paraspinal abscess (HCC) 01/08/2019   Arthritis, septic, knee (HCC) 01/07/2019   Opioid abuse with opioid-induced mood disorder (HCC) 10/02/2018   Opioid use disorder  09/29/2018   Substance induced mood disorder (HCC) 09/29/2018   Major depressive disorder, recurrent severe without psychotic features (HCC) 09/29/2018    Allergies:  Allergies  Allergen Reactions   Nickel    Medications:  Current Outpatient Medications:    albuterol (VENTOLIN HFA) 108 (90 Base) MCG/ACT inhaler, Inhale 2 puffs into the lungs every 6 (six) hours as needed for wheezing or shortness of breath., Disp: 8 g, Rfl: 2   doxycycline (VIBRA-TABS) 100 MG tablet, Take 1 tablet (100 mg total) by mouth 2 (two) times daily., Disp: 14 tablet, Rfl: 0   Spacer/Aero-Hold Chamber Bags MISC, Use the spacer with the inhaler to take 2 puffs of the inhaler 4 times a day.  Once you feel better for a couple days you can stop., Disp: 1 Units, Rfl: 0  Observations/Objective: Patient is well-developed, well-nourished in no acute distress.  Resting comfortably at home.  Head is normocephalic, atraumatic.  No labored breathing.  Speech is clear and coherent with logical content.  Patient is alert and oriented at baseline.    Assessment and Plan: There are no diagnoses linked to this encounter. - Suspect anal fissure as bleeding was onset with a hard, painful BM yesterday - Suspect clot today was from where injury was trying to clot but pushed off by having a BM - Has no bleeding in between BM - No warning features or signs requiring emergent evaluation at this time - Trial Anusol-HC suppository - Epsom salt soaks or SITZ baths as needed - Increase dietary fibers - Stool softener - Increase fluids - Follow up in person if bleeding worsens or fails to resolve with treatment  Follow Up Instructions: I discussed the assessment and treatment plan with the patient. The patient was provided an opportunity to ask questions and all were answered. The patient agreed with the plan and demonstrated an understanding of the instructions.  A copy of instructions were sent to the patient via MyChart unless  otherwise noted below.    The patient was advised to call back or seek an in-person evaluation if the symptoms worsen or if the condition fails to improve as anticipated.    Margaretann Loveless, PA-C

## 2023-12-12 NOTE — Patient Instructions (Signed)
 [name], thank you for joining Margaretann Loveless, PA-C for today's virtual visit.  While this provider is not your primary care provider (PCP), if your PCP is located in our provider database this encounter information will be shared with them immediately following your visit.   A [facility] MyChart account gives you access to today's visit and all your visits, tests, and labs performed at [facility] " click here if you don't have a [facility] MyChart account or go to [email or web address]  Consent: (Patient) James Beard. provided verbal consent for this virtual visit at the beginning of the encounter.  Current Medications:  Current Outpatient Medications:    hydrocortisone (ANUSOL-HC) 25 MG suppository, Place 1 suppository (25 mg total) rectally 2 (two) times daily., Disp: 12 suppository, Rfl: 0   albuterol (VENTOLIN HFA) 108 (90 Base) MCG/ACT inhaler, Inhale 2 puffs into the lungs every 6 (six) hours as needed for wheezing or shortness of breath., Disp: 8 g, Rfl: 2   doxycycline (VIBRA-TABS) 100 MG tablet, Take 1 tablet (100 mg total) by mouth 2 (two) times daily., Disp: 14 tablet, Rfl: 0   Spacer/Aero-Hold Chamber Bags MISC, Use the spacer with the inhaler to take 2 puffs of the inhaler 4 times a day.  Once you feel better for a couple days you can stop., Disp: 1 Units, Rfl: 0   Medications ordered in this encounter:  Meds ordered this encounter  Medications   hydrocortisone (ANUSOL-HC) 25 MG suppository    Sig: Place 1 suppository (25 mg total) rectally 2 (two) times daily.    Dispense:  12 suppository    Refill:  0    Supervising Provider:   Merrilee Jansky [[id number]]     *If you need refills on other medications prior to your next appointment, please contact your pharmacy*  Follow-Up: Call back or seek an in-person evaluation if the symptoms worsen or if the condition fails to improve as anticipated.  [facility] [phone]  Other Instructions Anal Fissure, Adult  An anal fissure is a small tear or crack in the tissue near the opening of the butt (anus). In most cases, bleeding from a fissure stops on its own within a few minutes. You may have bleeding each time you poop until the fissure heals. What are the causes? An anal fissure may be caused by large or hard poop (stool). Other causes include: Having trouble pooping (constipation). Getting diarrhea a lot. Inflammatory bowel disease, such as Crohn's disease or ulcerative colitis. Childbirth. Infections. Anal sex. What are the signs or symptoms? Symptoms of an anal fissure include: Bleeding from the rectum. Small amounts of blood seen on your poop, on the toilet paper, or in the toilet after you poop. The blood coats the outside of the poop. It is not mixed with the poop. Pain when you poop. Itching or irritation around the anus. How is this diagnosed? A health care provider may diagnose a fissure by looking closely at the anal area. In some cases, a rectal exam may be done or a short tube (anoscope) may be used to look at the anal canal. How is this treated? Treatment for an anal fissure may include: Taking steps to avoid and treat constipation. Taking fiber supplements. These can help soften your poop. Taking sitz baths. These can help heal the tear. Using medicated creams or ointments. Doing physical therapy. This can help strengthen the area between your hip bones (pelvis). If other treatments  do not work, you may need: Botulinum injections. Surgery to fix the fissure. Follow these instructions at home: Medicines Take or use over-the-counter and prescription medicines only as told by your provider. This includes medicated creams and ointments. Use supplements and medicines to make your poop soft (stool softeners) as told by your provider. Managing constipation You may need to take these actions to prevent or treat constipation: Drink  enough fluid to keep your pee (urine) pale yellow. Eat foods that are high in fiber, such as beans, whole grains, and fresh fruits and vegetables. Avoid unripe bananas. Ripe bananas may help if you feel constipated. Limit foods that are high in fat and processed sugars, such as fried or sweet foods. Avoid dairy products, such as milk.  General instructions  Keep the anal area clean and dry. Take sitz baths as told by your provider. Do not use soap in the sitz baths. Contact a health care provider if: You have more bleeding. You have a fever. You have diarrhea that is mixed with blood. Your pain does not go away. Your problems get worse rather than better. This information is not intended to replace advice given to you by your health care provider. Make sure you discuss any questions you have with your health care provider. Document Revised: 09/08/2022 Document Reviewed: 09/08/2022 Elsevier Patient Education  2024 Elsevier Inc.   If you have been instructed to have an in-person evaluation today at a local Urgent Care facility, please use the link below. It will take you to a list of all of our available [facility], including address, phone number and hours of operation. Please do not delay care.  [facility]  If you or a family member do not have a primary care provider, use the link below to schedule a visit and establish care. When you choose a [facility] primary care physician or advanced practice provider, you gain a long-term partner in health. Find a Primary Care Provider  Learn more about [facility] in-office and virtual care options: [facility] - Get Care Now

## 2024-03-03 NOTE — Progress Notes (Deleted)
     Emmajane Altamura T. Leocadia Idleman, MD, CAQ [facility] at [facility] [address] [place], [address]  Phone: [phone]  FAX: [phone]  James Beard - 38 y.o. male  MRN [id number]  Date of Birth: 1986/05/03  Date: 03/04/2024  PCP: Wendee Lynwood HERO, NP  Referral: Wendee Lynwood HERO, NP  No chief complaint on file.  Subjective:   James Briseno. is a 38 y.o. very pleasant male patient with There is no height or weight on file to calculate BMI. who presents with the following:  I am asked to evaluate this patient who has lost 40 pounds in 1 month.  The patient has been to the ER numerous times.  The chart notes and opioid use disorder, and he also has chronic hepatitis C.  The past medical history includes bipolar type I.  History of homelessness, hepatitis B also appears at least to be antibody positive?    Review of Systems is noted in the HPI, as appropriate  Objective:   There were no vitals taken for this visit.  GEN: No acute distress; alert,appropriate. PULM: Breathing comfortably in no respiratory distress PSYCH: Normally interactive.   Laboratory and Imaging Data:  Assessment and Plan:   ***

## 2024-03-04 ENCOUNTER — Ambulatory Visit: Payer: Self-pay | Admitting: Family Medicine

## 2024-03-25 ENCOUNTER — Ambulatory Visit: Payer: Self-pay | Admitting: Nurse Practitioner

## 2024-03-29 ENCOUNTER — Ambulatory Visit: Payer: Self-pay | Admitting: Nurse Practitioner

## 2024-03-29 ENCOUNTER — Ambulatory Visit (INDEPENDENT_AMBULATORY_CARE_PROVIDER_SITE_OTHER)
Admission: RE | Admit: 2024-03-29 | Discharge: 2024-03-29 | Disposition: A | Discharge status: Home / Self Care | Source: Ambulatory Visit | Attending: Nurse Practitioner | Admitting: Nurse Practitioner

## 2024-03-29 VITALS — BP 104/60 | HR 65 | Temp 98.2°F | Ht 69.0 in | Wt 132.2 lb

## 2024-03-29 DIAGNOSIS — R0789 Other chest pain: Secondary | ICD-10-CM | POA: Diagnosis not present

## 2024-03-29 DIAGNOSIS — R634 Abnormal weight loss: Secondary | ICD-10-CM | POA: Diagnosis not present

## 2024-03-29 DIAGNOSIS — R202 Paresthesia of skin: Secondary | ICD-10-CM | POA: Diagnosis not present

## 2024-03-29 DIAGNOSIS — R1010 Upper abdominal pain, unspecified: Secondary | ICD-10-CM

## 2024-03-29 DIAGNOSIS — Z720 Tobacco use: Secondary | ICD-10-CM | POA: Diagnosis not present

## 2024-03-29 LAB — COMPREHENSIVE METABOLIC PANEL WITH GFR
ALT: 23 U/L (ref 0–53)
AST: 28 U/L (ref 0–37)
Albumin: 4.8 g/dL (ref 3.5–5.2)
Alkaline Phosphatase: 72 U/L (ref 39–117)
BUN: 14 mg/dL (ref 6–23)
CO2: 30 meq/L (ref 19–32)
Calcium: 9.7 mg/dL (ref 8.4–10.5)
Chloride: 101 meq/L (ref 96–112)
Creatinine, Ser: 0.71 mg/dL (ref 0.40–1.50)
GFR: 115.49 mL/min (ref >60.00)
Glucose, Bld: 79 mg/dL (ref 70–99)
Potassium: 4.1 meq/L (ref 3.5–5.1)
Sodium: 141 meq/L (ref 135–145)
Total Bilirubin: 0.5 mg/dL (ref 0.2–1.2)
Total Protein: 7 g/dL (ref 6.0–8.3)

## 2024-03-29 LAB — URINALYSIS, ROUTINE W REFLEX MICROSCOPIC
Bilirubin Urine: NEGATIVE
Hgb urine dipstick: NEGATIVE
Leukocytes,Ua: NEGATIVE
Nitrite: NEGATIVE
RBC / HPF: NONE SEEN (ref 0–?)
Specific Gravity, Urine: 1.025 (ref 1.000–1.030)
Total Protein, Urine: NEGATIVE
Urine Glucose: NEGATIVE
Urobilinogen, UA: 1 (ref 0.0–1.0)
WBC, UA: NONE SEEN (ref 0–?)
pH: 6 (ref 5.0–8.0)

## 2024-03-29 LAB — CBC
HCT: 37.7 % — ABNORMAL LOW (ref 39.0–52.0)
Hemoglobin: 12.4 g/dL — ABNORMAL LOW (ref 13.0–17.0)
MCHC: 33 g/dL (ref 30.0–36.0)
MCV: 89.8 fl (ref 78.0–100.0)
Platelets: 209 K/uL (ref 150.0–400.0)
RBC: 4.19 Mil/uL — ABNORMAL LOW (ref 4.22–5.81)
RDW: 13.8 % (ref 11.5–15.5)
WBC: 5.9 K/uL (ref 4.0–10.5)

## 2024-03-29 LAB — VITAMIN B12: Vitamin B-12: 322 pg/mL (ref 211–911)

## 2024-03-29 LAB — HEMOGLOBIN A1C: Hgb A1c MFr Bld: 6 % (ref 4.6–6.5)

## 2024-03-29 LAB — TSH: TSH: 0.64 u[IU]/mL (ref 0.35–5.50)

## 2024-03-29 LAB — LIPASE: Lipase: 14 U/L (ref 11.0–59.0)

## 2024-03-29 MED ORDER — PREDNISONE 10 MG (21) PO TBPK: ORAL_TABLET | ORAL | 0 refills | Status: AC
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
## 2024-03-29 NOTE — Assessment & Plan Note (Signed)
 Ambiguous in nature patient was tender in right upper quadrant and left upper quadrant.  Pending CBC, CMP, lipase.  Can consider getting CT abdomen pelvis with contrast if labs unrevealing in the setting of unintentional weight loss.

## 2024-03-29 NOTE — Assessment & Plan Note (Signed)
 Pending urine microscopy rule out microscopic hematuria

## 2024-03-29 NOTE — Assessment & Plan Note (Signed)
 Nature and longstanding.  Exacerbating factors are his new employment with pushing pulling and twisting with lifting.  Obtain chest x-ray to make sure no acute pathology with the ribs.

## 2024-03-29 NOTE — Assessment & Plan Note (Signed)
 Ambiguous in nature precipitous weight loss over the past couple months.  Patient seems to be increasing his physical activity but he is eating appropriate amount per his report inclusive of supplementing with protein shakes.  Patient does have a history of smoking we will check chest x-ray to make sure no occult masses check a urine to make sure no occult bleeding.  Also check A1c and TSH along with CBC and CMP.

## 2024-03-29 NOTE — Assessment & Plan Note (Signed)
 Ambiguous in nature left-sided will do prednisone  taper see if this is beneficial.  If not consider nerve testing will obtain cervical film today

## 2024-03-29 NOTE — Patient Instructions (Signed)
 Nice to see you today  I will be in touch with the labs and xray once I have the results.  Follow up with me in 4 weeks, sooner if you need me

## 2024-04-03 ENCOUNTER — Ambulatory Visit: Payer: Self-pay | Admitting: Nurse Practitioner

## 2024-04-04 ENCOUNTER — Telehealth: Payer: Self-pay | Admitting: Nurse Practitioner

## 2024-04-04 NOTE — Telephone Encounter (Signed)
 Left voicemail for patient to call the office back.

## 2024-04-04 NOTE — Telephone Encounter (Unsigned)
 Copied from CRM [id number]. Topic: General - Other >> Apr 04, 2024 12:47 PM Jasmin G wrote: Reason for CRM: Pt received a missed call from Ms. Nellie Hummer, please have her give pt a call back ASAP to discuss recent test results

## 2024-04-04 NOTE — Telephone Encounter (Signed)
 Called patient reviewed all information and repeated back to me. Will call if any questions.  ? ?

## 2024-04-04 NOTE — Telephone Encounter (Signed)
 Received CRM stating pt called to speak to nurse regarding reference to recent lab results. Call back # [phone]

## 2024-04-06 ENCOUNTER — Other Ambulatory Visit: Payer: Self-pay

## 2024-04-06 ENCOUNTER — Emergency Department
Admission: EM | Admit: 2024-04-06 | Discharge: 2024-04-06 | Disposition: A | Discharge status: Home / Self Care | Attending: Emergency Medicine | Admitting: Emergency Medicine

## 2024-04-06 DIAGNOSIS — J029 Acute pharyngitis, unspecified: Secondary | ICD-10-CM | POA: Diagnosis present

## 2024-04-06 LAB — RESP PANEL BY RT-PCR (RSV, FLU A&B, COVID)  RVPGX2
Influenza A by PCR: NEGATIVE
Influenza B by PCR: NEGATIVE
Resp Syncytial Virus by PCR: NEGATIVE
SARS Coronavirus 2 by RT PCR: NEGATIVE

## 2024-04-06 LAB — GROUP A STREP BY PCR: Group A Strep by PCR: NOT DETECTED

## 2024-04-06 NOTE — ED Triage Notes (Signed)
 Patient presents with a sore throat that began today. States he feels like his lymph nodes are also swollen on both sides of his neck. Denies fevers.

## 2024-04-06 NOTE — ED Provider Notes (Signed)
   [facility] Provider Note    Event Date/Time   First MD Initiated Contact with Patient 04/06/24 2201     (approximate)   History   Sore Throat   HPI  James Beard. is a 38 y.o. male with history of hepatitis C, IV drug abuse, MDD and as listed in EMR presents to the emergency department for treatment and evaluation of sore throat that started this morning, headache, and lymph nodes in his neck tender and swelling.  No known fever.  He has taken some Tylenol  and Pepto-Bismol.  He states he has felt a little nauseated but has not had any vomiting or diarrhea.     Physical Exam    Vitals:   04/06/24 2153  BP: (!) 142/74  Pulse: 71  Resp: 15  Temp: 99.1 F (37.3 C)  SpO2: 98%    General: Awake, no distress.  CV:  Good peripheral perfusion.  Resp:  Normal effort.  Abd:  No distention.  Other:  No tonsillar edema.  No exudate noted on the posterior oropharynx.  Uvula is midline.  Anterior cervical lymph nodes palpable and tender.   ED Results / Procedures / Treatments   Labs (all labs ordered are listed, but only abnormal results are displayed)  Labs Reviewed  GROUP A STREP BY PCR  RESP PANEL BY RT-PCR (RSV, FLU A&B, COVID)  RVPGX2     EKG  Not indicated   RADIOLOGY  Image and radiology report reviewed and interpreted by me. Radiology report consistent with the same.  Not indicated  PROCEDURES:  Critical Care performed: No  Procedures   MEDICATIONS ORDERED IN ED:  Medications - No data to display   IMPRESSION / MDM / ASSESSMENT AND PLAN / ED COURSE   I have reviewed the triage note and vital signs. Vital signs are stable.   Differential diagnosis includes, but is not limited to, COVID, influenza, strep throat, viral syndrome  Patient's presentation is most consistent with acute illness / injury with system symptoms.  38 year old male presenting to the emergency department for treatment and evaluation of  sore throat, lymph node swelling, and nausea.  See HPI for further details.  Respiratory panel and strep screen are pending.  Strep screen is negative.  Respiratory panel is negative for COVID, influenza, RSV.  Results discussed with the patient.  He was encouraged to rest, increase fluids, take Tylenol  or ibuprofen  for pain and/or fever.  Work excuse provided for James Beard.  He was encouraged to see primary care, urgent care, or return to the emergency department if symptoms change or worsen.     FINAL CLINICAL IMPRESSION(S) / ED DIAGNOSES   Final diagnoses:  Acute pharyngitis, unspecified etiology     Rx / DC Orders   ED Discharge Orders     None        Note:  This document was prepared using Dragon voice recognition software and may include unintentional dictation errors.   Herlinda Kirk NOVAK, FNP 04/07/24 [name], MD 04/07/24 2328

## 2024-04-16 ENCOUNTER — Other Ambulatory Visit (HOSPITAL_COMMUNITY)
Admission: RE | Admit: 2024-04-16 | Discharge: 2024-04-16 | Disposition: A | Discharge status: Home / Self Care | Source: Ambulatory Visit | Attending: Nurse Practitioner | Admitting: Nurse Practitioner

## 2024-04-16 ENCOUNTER — Ambulatory Visit (INDEPENDENT_AMBULATORY_CARE_PROVIDER_SITE_OTHER): Admitting: Nurse Practitioner

## 2024-04-16 VITALS — BP 114/72 | HR 60 | Temp 98.2°F | Ht 68.0 in | Wt 133.4 lb

## 2024-04-16 DIAGNOSIS — R634 Abnormal weight loss: Secondary | ICD-10-CM

## 2024-04-16 DIAGNOSIS — Z113 Encounter for screening for infections with a predominantly sexual mode of transmission: Secondary | ICD-10-CM | POA: Diagnosis not present

## 2024-04-16 DIAGNOSIS — R202 Paresthesia of skin: Secondary | ICD-10-CM

## 2024-04-16 DIAGNOSIS — G479 Sleep disorder, unspecified: Secondary | ICD-10-CM | POA: Diagnosis not present

## 2024-04-16 MED ORDER — HYDROXYZINE HCL 10 MG PO TABS: 10.0000 mg | ORAL_TABLET | Freq: Every evening | ORAL | 0 refills | Status: AC | PRN

## 2024-04-16 NOTE — Patient Instructions (Signed)
 Nice to see you today I will be in touch with the results once I have them  Follow up with me in 3 months for your physical, sooner if you need me

## 2024-04-16 NOTE — Assessment & Plan Note (Signed)
 Did review C-spine x-ray with patient.  He had improvement in symptoms with prednisone  taper.  Stable at this juncture

## 2024-04-16 NOTE — Progress Notes (Signed)
 Established Patient Office Visit  Subjective   Patient ID: James Beard., male    DOB: August 07, 1986  Age: 38 y.o. MRN: [id number]  Chief Complaint  Patient presents with   Follow-up    Pt complains of ongoing struggle to gain weight. States he has been not getting much sleep.      HPI  Weight loss/paresthesia: Patient was last seen by me on 03/29/2024 for multiple complaints including a paresthesias and unintentional weight loss.  At that juncture we did urine microscopy rule out microscopic hematuria CBC, B12, CMP, chest x-ray, cervical spine x-ray and patient was given a prednisone  pack to see if this helps with the paresthesia.  Labs were grossly benign with a slightly low hemoglobin at 12.4 and A1c at 6.0.  Patient's chest x-ray shows some hyperinflation and central bronchial thickening.  Patient cervical spine x-ray showed right foraminal stenosis at C4-C5 and C5-C6 on the left and C3-C4 on the right.  He is here for follow-up.  States that he did take the prednisone  and it did help. States that is intermittent and has been improved   States that the appetite is the same. He claims it as week and has to force himself to eat. He is drinking coffee, celisus waters, and protein drink    State that he does work over night and willl go to bed 10-11. States that I thas been 12-2 as of late and will get up around 5-6. Statse that he has trazoadone and ambien.   Review of Systems  Constitutional:  Negative for chills and fever.  Respiratory:  Negative for shortness of breath.   Cardiovascular:  Negative for chest pain.  Gastrointestinal:  Negative for blood in stool.  Neurological:  Negative for dizziness and headaches.      Objective:     BP 114/72   Pulse 60   Temp 98.2 F (36.8 C) (Oral)   Ht 5' 8 (1.727 m)   Wt 133 lb 6.4 oz (60.5 kg)   SpO2 98%   BMI 20.28 kg/m  BP Readings from Last 3 Encounters:  04/16/24 114/72  04/06/24 (!) 142/74  03/29/24 104/60    Wt Readings from Last 3 Encounters:  04/16/24 133 lb 6.4 oz (60.5 kg)  04/06/24 130 lb (59 kg)  03/29/24 132 lb 3.2 oz (60 kg)   SpO2 Readings from Last 3 Encounters:  04/16/24 98%  04/06/24 98%  03/29/24 97%      Physical Exam Vitals and nursing note reviewed.  Constitutional:      Appearance: Normal appearance.  Cardiovascular:     Rate and Rhythm: Normal rate and regular rhythm.     Heart sounds: Normal heart sounds.  Pulmonary:     Effort: Pulmonary effort is normal.     Breath sounds: Normal breath sounds.  Neurological:     Mental Status: He is alert.      No results found for any visits on 04/16/24.    The ASCVD Risk score (Arnett DK, et al., 2019) failed to calculate for the following reasons:   The 2019 ASCVD risk score is only valid for ages 68 to 34    Assessment & Plan:   Problem List Items Addressed This Visit       Other   Paresthesia   Did review C-spine x-ray with patient.  He had improvement in symptoms with prednisone  taper.  Stable at this juncture      Unintentional weight loss   Patient has  gained 3 pounds since last time he was evaluated by medical functional.  1 pound since seeing me.  Continue working on protein rich diet along with some carbs to help gain weight.  Patient's physical activity patient is outpacing his caloric intake given riding his bicycle back and forth to work.      Screening examination for STI - Primary   Relevant Orders   HIV Antibody (routine testing w rflx)   RPR   Urine cytology ancillary only   Trouble in sleeping   Patient has tried trazodone  and Ambien in the past. These gave him the hung over feeling.  Will trial patient on hydroxyzine  10 mg nightly as needed.      Relevant Medications   hydrOXYzine  (ATARAX ) 10 MG tablet    Return in about 3 months (around 07/17/2024) for CPE and Labs.    Adina Crandall, NP

## 2024-04-16 NOTE — Assessment & Plan Note (Signed)
 Patient has tried trazodone  and Ambien in the past. These gave him the hung over feeling.  Will trial patient on hydroxyzine  10 mg nightly as needed.

## 2024-04-16 NOTE — Assessment & Plan Note (Signed)
 Patient has gained 3 pounds since last time he was evaluated by medical functional.  1 pound since seeing me.  Continue working on protein rich diet along with some carbs to help gain weight.  Patient's physical activity patient is outpacing his caloric intake given riding his bicycle back and forth to work.

## 2024-04-17 LAB — URINE CYTOLOGY ANCILLARY ONLY
Chlamydia: NEGATIVE
Comment: NEGATIVE
Comment: NEGATIVE
Comment: NORMAL
Neisseria Gonorrhea: NEGATIVE
Trichomonas: NEGATIVE

## 2024-04-17 LAB — HIV ANTIBODY (ROUTINE TESTING W REFLEX): HIV 1&2 Ab, 4th Generation: NONREACTIVE

## 2024-04-17 LAB — RPR: RPR Ser Ql: NONREACTIVE

## 2024-04-18 ENCOUNTER — Ambulatory Visit: Payer: Self-pay | Admitting: Nurse Practitioner

## 2024-06-16 ENCOUNTER — Emergency Department
Admission: EM | Admit: 2024-06-16 | Discharge: 2024-06-16 | Disposition: A | Discharge status: Home / Self Care | Attending: Emergency Medicine | Admitting: Emergency Medicine

## 2024-06-16 DIAGNOSIS — K047 Periapical abscess without sinus: Secondary | ICD-10-CM | POA: Insufficient documentation

## 2024-06-16 DIAGNOSIS — K029 Dental caries, unspecified: Secondary | ICD-10-CM | POA: Diagnosis not present

## 2024-06-16 DIAGNOSIS — R509 Fever, unspecified: Secondary | ICD-10-CM | POA: Diagnosis present

## 2024-06-16 LAB — RESP PANEL BY RT-PCR (RSV, FLU A&B, COVID)  RVPGX2
Influenza A by PCR: NEGATIVE
Influenza B by PCR: NEGATIVE
Resp Syncytial Virus by PCR: NEGATIVE
SARS Coronavirus 2 by RT PCR: NEGATIVE

## 2024-06-16 MED ORDER — AMOXICILLIN-POT CLAVULANATE 875-125 MG PO TABS
1.0000 | ORAL_TABLET | Freq: Once | ORAL | Status: AC
  Administered 2024-06-16: 1 via ORAL
  Filled 2024-06-16: qty 1

## 2024-06-16 MED ORDER — AMOXICILLIN-POT CLAVULANATE 875-125 MG PO TABS: 1.0000 | ORAL_TABLET | Freq: Two times a day (BID) | ORAL | 0 refills | Status: AC

## 2024-06-16 NOTE — Discharge Instructions (Signed)
 Follow-up with a dentist for further management.  Return to ED for any new or worsening symptoms.

## 2024-06-16 NOTE — ED Notes (Signed)
 Pt states he has a abscess on a tooth and same has been causing him pain. He states it hasn't been draining just causing discomfort/ pain.

## 2024-06-16 NOTE — ED Provider Notes (Signed)
 [facility] Emergency Department Provider Note     Event Date/Time   First MD Initiated Contact with Patient 06/16/24 2147     (approximate)   History   Dental Pain   HPI  James Beard. is a 38 y.o. male with a past medical history of IV drug use, hepatitis C, bipolar 1 disorder, MRSA, presents to the ED for evaluation of possible dental abscess.  Patient has poor detention and has not seen a dentist.  He reports he believes he has a fever and has been having cold chills.  Also notes nonproductive cough.     Physical Exam   Triage Vital Signs: ED Triage Vitals  Encounter Vitals Group     BP 06/16/24 2126 (!) 155/96     Girls Systolic BP Percentile --      Girls Diastolic BP Percentile --      Boys Systolic BP Percentile --      Boys Diastolic BP Percentile --      Pulse Rate 06/16/24 2126 83     Resp 06/16/24 2126 18     Temp 06/16/24 2126 100.2 F (37.9 C)     Temp Source 06/16/24 2126 Oral     SpO2 06/16/24 2126 95 %     Weight 06/16/24 2123 135 lb (61.2 kg)     Height 06/16/24 2123 5' 9 (1.753 m)     Head Circumference --      Peak Flow --      Pain Score 06/16/24 2123 8     Pain Loc --      Pain Education --      Exclude from Growth Chart --     Most recent vital signs: Vitals:   06/16/24 2126  BP: (!) 155/96  Pulse: 83  Resp: 18  Temp: 100.2 F (37.9 C)  SpO2: 95%    General Awake, no distress.  Nontoxic-appearing HEENT NCAT.  CV:  Good peripheral perfusion.  RESP:  Normal effort.  ABD:  No distention.  Other:  Overall poor detention.  Multiple missing and broken teeth.  Moderate decay over majority of teeth.  Area of concern is tooth #20 that is broken.  No visual or palpable abscess noted.  Tender to palpation.   ED Results / Procedures / Treatments   Labs (all labs ordered are listed, but only abnormal results are displayed) Labs Reviewed  RESP PANEL BY RT-PCR (RSV, FLU A&B, COVID)  RVPGX2   No  results found.  PROCEDURES:  Critical Care performed: No  Procedures   MEDICATIONS ORDERED IN ED: Medications  amoxicillin -clavulanate (AUGMENTIN ) 875-125 MG per tablet 1 tablet (1 tablet Oral Given 06/16/24 2252)     IMPRESSION / MDM / ASSESSMENT AND PLAN / ED COURSE  I reviewed the triage vital signs and the nursing notes.                               38 y.o. male presents to the emergency department for evaluation and treatment of dental pain. See HPI for further details.   Differential diagnosis includes, but is not limited to dental cavities, dental infection, dental abscess, viral URI  Patient's presentation is most consistent with acute, uncomplicated illness.  Patient is alert and oriented.  He is hemodynamically stable.  Noted temp of 100.4F.  Physical exam findings are stated above.  No visualized or palpable abscess to indicate incision or drainage  at this time.  First dose of antibiotic given in the ED.  Will send remaining Augmentin  prescription to pharmacy.  Patient is in stable condition for discharge home.  Strict ED return precaution discussed.  Advised patient to follow-up with dentist for further management as soon as possible.   FINAL CLINICAL IMPRESSION(S) / ED DIAGNOSES   Final diagnoses:  Pain due to dental caries  Dental infection   Rx / DC Orders   ED Discharge Orders          Ordered    amoxicillin -clavulanate (AUGMENTIN ) 875-125 MG tablet  2 times daily        06/16/24 2245           Note:  This document was prepared using Dragon voice recognition software and may include unintentional dictation errors.    Margrette, Lorice Lafave A, PA-C 06/16/24 2258    Viviann Pastor, MD 06/19/24 [phone]

## 2024-06-16 NOTE — ED Triage Notes (Signed)
 Patient came in POV with c/o dental pain stating he have cavities. Patient also states he non-productive cough and thinks he had have a fever.

## 2024-06-16 NOTE — ED Notes (Signed)
 Swab sent to lab

## 2024-07-15 ENCOUNTER — Emergency Department
Admission: EM | Admit: 2024-07-15 | Discharge: 2024-07-16 | Disposition: A | Discharge status: Home / Self Care | Attending: Emergency Medicine | Admitting: Emergency Medicine

## 2024-07-15 ENCOUNTER — Other Ambulatory Visit: Payer: Self-pay

## 2024-07-15 DIAGNOSIS — R059 Cough, unspecified: Secondary | ICD-10-CM | POA: Diagnosis present

## 2024-07-15 DIAGNOSIS — J029 Acute pharyngitis, unspecified: Secondary | ICD-10-CM | POA: Diagnosis not present

## 2024-07-15 LAB — RESP PANEL BY RT-PCR (RSV, FLU A&B, COVID)  RVPGX2
Influenza A by PCR: NEGATIVE
Influenza B by PCR: NEGATIVE
Resp Syncytial Virus by PCR: NEGATIVE
SARS Coronavirus 2 by RT PCR: NEGATIVE

## 2024-07-15 LAB — GROUP A STREP BY PCR: Group A Strep by PCR: NOT DETECTED

## 2024-07-15 NOTE — ED Notes (Signed)
 Swabs sent to the lab at this time.

## 2024-07-15 NOTE — ED Triage Notes (Signed)
 Pt reports he woke up tonight with cough congestion sore throat and body aches.

## 2024-07-16 NOTE — Discharge Instructions (Signed)
 You were seen in the ER today for your sore throat.  Your COVID flu and RSV testing today were negative, but as we discussed this can sometimes be negative early in the course of illness.  You can use Tylenol  and ibuprofen  to help with your symptoms.  Have included more information in your paperwork.  Return to the ER for new or worsening symptoms.

## 2024-07-16 NOTE — ED Provider Notes (Signed)
 [facility] Provider Note    Event Date/Time   First MD Initiated Contact with Patient 07/15/24 2338     (approximate)   History   Cough and Sore Throat   HPI  James Beard. is a 38 year old male with history of polysubstance use presenting to the emergency department for evaluation of sore throat.  Patient reports he woke up this evening to go to work and noticed a sore throat with associated body aches.  No fevers, cough.  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  No abdominal pain, vomiting, diarrhea.      Physical Exam   Triage Vital Signs: ED Triage Vitals  Encounter Vitals Group     BP 07/15/24 2138 136/75     Girls Systolic BP Percentile --      Girls Diastolic BP Percentile --      Boys Systolic BP Percentile --      Boys Diastolic BP Percentile --      Pulse Rate 07/15/24 2138 63     Resp 07/15/24 2138 17     Temp 07/15/24 2138 98.2 F (36.8 C)     Temp src --      SpO2 07/15/24 2138 98 %     Weight 07/15/24 2137 135 lb (61.2 kg)     Height 07/15/24 2137 5' 8 (1.727 m)     Head Circumference --      Peak Flow --      Pain Score 07/15/24 2137 7     Pain Loc --      Pain Education --      Exclude from Growth Chart --     Most recent vital signs: Vitals:   07/15/24 2138  BP: 136/75  Pulse: 63  Resp: 17  Temp: 98.2 F (36.8 C)  SpO2: 98%     General: Awake, interactive  CV:  Good peripheral perfusion HEENT: No posterior oropharyngeal swelling, uvular deviation, swelling underneath the tongue Resp:  Unlabored respirations, lungs clear to auscultation Abd:  Nondistended.  Neuro:  Symmetric facial movement, fluid speech   ED Results / Procedures / Treatments   Labs (all labs ordered are listed, but only abnormal results are displayed) Labs Reviewed  GROUP A STREP BY PCR  RESP PANEL BY RT-PCR (RSV, FLU A&B, COVID)  [id number]     EKG EKG  independently reviewed and interpreted by myself demonstrates:    RADIOLOGY Imaging independently reviewed and interpreted by myself demonstrates:   Formal Radiology Read:  No results found.  PROCEDURES:  Critical Care performed: No  Procedures   MEDICATIONS ORDERED IN ED: Medications - No data to display   IMPRESSION / MDM / ASSESSMENT AND PLAN / ED COURSE  I reviewed the triage vital signs and the nursing notes.  Differential diagnosis includes, but is not limited to, viral illness, low suspicion pneumonia in the absence of cough or abnormal respiratory findings, strep pharyngitis  Patient's presentation is most consistent with acute illness / injury with system symptoms.  38 year old male presenting with a few hours of sore throat without fever.  Reassuring physical exam.  Stable vitals on presentation.  Negative viral swab and strep test.  Did discuss possibility of false negative given recent onset of symptoms.  Patient is comfortable with discharge home.  Discussed supportive care.  Strict return precautions provided.      FINAL CLINICAL IMPRESSION(S) /  ED DIAGNOSES   Final diagnoses:  Sore throat     Rx / DC Orders   ED Discharge Orders     None        Note:  This document was prepared using Dragon voice recognition software and may include unintentional dictation errors.   Levander Slate, MD 07/16/24 [phone]

## 2024-07-18 ENCOUNTER — Encounter: Admitting: Nurse Practitioner

## 2024-07-18 NOTE — Progress Notes (Deleted)
   Established Patient Office Visit  Subjective   Patient ID: Antoino Westhoff., male    DOB: [birth year]  Age: 38 y.o. MRN: [id number]  No chief complaint on file.   HPI  for complete physical and follow up of chronic conditions.  Immunizations: -Tetanus: Completed in 2020 -Influenza:  -Shingles: Too young -Pneumonia: Completed pneumococcal 23 needs Prevnar 20  Diet: Fair diet.  Exercise: No regular exercise.  Eye exam: Completes annually  Dental exam: Completes semi-annually    Colonoscopy: Too young, currently average risk Lung Cancer Screening: To young but current smoker  PSA: Too young, currently average risk  Sleep:  STI:     {History (Optional):23778}  ROS    Objective:     There were no vitals taken for this visit. {Vitals History (Optional):23777}  Physical Exam   No results found for any visits on 07/18/24.  {Labs (Optional):23779}  The ASCVD Risk score (Arnett DK, et al., 2019) failed to calculate for the following reasons:   The 2019 ASCVD risk score is only valid for ages 37 to 19    Assessment & Plan:   Problem List Items Addressed This Visit   None   No follow-ups on file.    Adina Crandall, NP
